# Patient Record
Sex: Female | Born: 1985 | Race: Black or African American | Hispanic: No | Marital: Married | Smoking: Former smoker
Health system: Southern US, Community
[De-identification: ages and names within clinical notes are randomized; demographics above are authoritative.]

## PROBLEM LIST (undated history)

## (undated) ENCOUNTER — Inpatient Hospital Stay (HOSPITAL_COMMUNITY): Payer: Self-pay

## (undated) DIAGNOSIS — E119 Type 2 diabetes mellitus without complications: Secondary | ICD-10-CM

## (undated) DIAGNOSIS — D649 Anemia, unspecified: Secondary | ICD-10-CM

## (undated) DIAGNOSIS — F329 Major depressive disorder, single episode, unspecified: Secondary | ICD-10-CM

## (undated) DIAGNOSIS — Z98891 History of uterine scar from previous surgery: Secondary | ICD-10-CM

## (undated) DIAGNOSIS — F32A Depression, unspecified: Secondary | ICD-10-CM

## (undated) DIAGNOSIS — H409 Unspecified glaucoma: Secondary | ICD-10-CM

## (undated) DIAGNOSIS — F99 Mental disorder, not otherwise specified: Secondary | ICD-10-CM

## (undated) DIAGNOSIS — Q644 Malformation of urachus: Secondary | ICD-10-CM

## (undated) HISTORY — DX: Anemia, unspecified: D64.9

## (undated) HISTORY — DX: Depression, unspecified: F32.A

## (undated) HISTORY — DX: Major depressive disorder, single episode, unspecified: F32.9

## (undated) HISTORY — PX: CYSTECTOMY: SUR359

## (undated) HISTORY — DX: History of uterine scar from previous surgery: Z98.891

## (undated) HISTORY — DX: Mental disorder, not otherwise specified: F99

## (undated) HISTORY — DX: Type 2 diabetes mellitus without complications: E11.9

## (undated) HISTORY — DX: Unspecified glaucoma: H40.9

---
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
## 2017-12-22 ENCOUNTER — Ambulatory Visit: Payer: Self-pay | Admitting: *Deleted

## 2018-01-07 LAB — CYTOLOGY - PAP
CYSTIC FIBROSIS PROFILE: NEGATIVE
Drug Screen, Urine: NEGATIVE
HEMOGLOBIN A1C: 7
Pap: NEGATIVE
Urine Culture, OB: NEGATIVE

## 2018-01-07 LAB — OB RESULTS CONSOLE ANTIBODY SCREEN: Antibody Screen: NEGATIVE

## 2018-01-07 LAB — OB RESULTS CONSOLE HGB/HCT, BLOOD
HEMATOCRIT: 36
HEMOGLOBIN: 11.2

## 2018-01-07 LAB — HEMOGLOBIN EVAL RFX ELECTROPHORESIS: HEMOGLOBIN EVALUATION: NORMAL

## 2018-01-07 LAB — OB RESULTS CONSOLE GC/CHLAMYDIA
Chlamydia: NEGATIVE
GC PROBE AMP, GENITAL: NEGATIVE

## 2018-01-07 LAB — OB RESULTS CONSOLE RUBELLA ANTIBODY, IGM: Rubella: IMMUNE

## 2018-01-07 LAB — OB RESULTS CONSOLE HIV ANTIBODY (ROUTINE TESTING): HIV: NONREACTIVE

## 2018-01-07 LAB — OB RESULTS CONSOLE ABO/RH: RH Type: POSITIVE

## 2018-01-07 LAB — OB RESULTS CONSOLE RPR: RPR: NONREACTIVE

## 2018-01-07 LAB — OB RESULTS CONSOLE PLATELET COUNT: Platelets: 409

## 2018-01-07 LAB — OB RESULTS CONSOLE VARICELLA ZOSTER ANTIBODY, IGG: VARICELLA IGG: IMMUNE

## 2018-01-08 ENCOUNTER — Other Ambulatory Visit (HOSPITAL_COMMUNITY): Payer: Self-pay | Admitting: Family

## 2018-01-08 DIAGNOSIS — Z369 Encounter for antenatal screening, unspecified: Secondary | ICD-10-CM

## 2018-01-11 ENCOUNTER — Encounter: Payer: Self-pay | Admitting: *Deleted

## 2018-01-12 ENCOUNTER — Encounter: Payer: Medicaid Other | Attending: Obstetrics & Gynecology | Admitting: *Deleted

## 2018-01-12 ENCOUNTER — Ambulatory Visit: Payer: Self-pay | Admitting: *Deleted

## 2018-01-12 DIAGNOSIS — O24111 Pre-existing diabetes mellitus, type 2, in pregnancy, first trimester: Secondary | ICD-10-CM | POA: Diagnosis not present

## 2018-01-12 DIAGNOSIS — E1165 Type 2 diabetes mellitus with hyperglycemia: Secondary | ICD-10-CM | POA: Diagnosis not present

## 2018-01-12 DIAGNOSIS — Z713 Dietary counseling and surveillance: Secondary | ICD-10-CM | POA: Diagnosis not present

## 2018-01-12 NOTE — Progress Notes (Signed)
Patient was seen on 01/12/2018 for type 2 Diabetes and pregnancy self-management. EDD 08/10/2017. Patient here with her mother, Levada Dy, who participated in the visit in a positive way. Patient states history of this diabetes for 3 years.  Diet history obtained. Patient eats good variety of all food groups as well as some high sugar foods and beverages include water, OJ, and occasionally sweet tea.  Her comfort level with carb counting is 0/10. Patient is currently on Metformin @ 1000 mg BID  diabetes medication. Patient moved here from [place], [place] in August this year to be near her mother who moved here in January.  The following learning objectives were met by the patient :   States the definition of type 2 Diabetes and pregnancy   States why dietary management is important in controlling blood glucose  Describes the effects of carbohydrates on blood glucose levels  Demonstrates ability to create a balanced meal plan  Demonstrates carbohydrate counting   States when to check blood glucose levels  Demonstrates proper blood glucose monitoring techniques  States the effect of stress and exercise on blood glucose levels  States the importance of limiting caffeine and abstaining from alcohol and smoking  Plan:   Aim for 3 Carb Choices per meal (45 grams) +/- 1 either way   Aim for 1-2 Carbs per snack  Begin reading food labels for Total Carbohydrate of foods  Consider  increasing your activity level by walking or other activity daily as tolerated  Begin checking BG before breakfast and 2 hours after first bite of breakfast, lunch and dinner as directed by MD   Bring Log Book/Sheet to every medical appointment OR Baby Scripts   Patient was introduced to [employer], she plans to use as record of BG electronically   Take medication as directed by MD  Blood glucose monitor given: [employer] Lot # [id number] Exp: 11/19/19 Blood glucose reading: 246 mg/dl after breakfast which was a  [place] and glass of OJ, which she was told to drink previously to help iron pill get absorbed better. We discussed using alternate foods high in Vitamin C in place of juice.  Patient already has a meter: but she cannot afford strips without insurance coverage. She states she has been testing fasting and the numbers have all been under 120 mg/dl. She has not been testing after meals yet.    Patient instructed to monitor glucose levels: FBS: 60 - 95 mg/dl 2 hour: <120 mg/dl  Patient received the following handouts:  Nutrition Diabetes and Pregnancy  Carbohydrate Counting List  Patient will be seen for follow-up in 1 month and as needed.

## 2018-01-19 ENCOUNTER — Encounter: Payer: Self-pay | Admitting: General Practice

## 2018-01-19 NOTE — Progress Notes (Unsigned)
Patient triggered in BRX for multiple elevated blood sugar readings. Patient has new OB appt in office on Thursday 10/3, will address at that visit.

## 2018-01-21 ENCOUNTER — Telehealth: Payer: Self-pay | Admitting: *Deleted

## 2018-01-21 ENCOUNTER — Ambulatory Visit: Payer: Self-pay | Admitting: *Deleted

## 2018-01-21 ENCOUNTER — Encounter: Payer: Medicaid Other | Attending: Obstetrics & Gynecology | Admitting: *Deleted

## 2018-01-21 ENCOUNTER — Ambulatory Visit (INDEPENDENT_AMBULATORY_CARE_PROVIDER_SITE_OTHER): Payer: Self-pay | Admitting: Obstetrics & Gynecology

## 2018-01-21 ENCOUNTER — Encounter: Payer: Self-pay | Admitting: Obstetrics & Gynecology

## 2018-01-21 DIAGNOSIS — Z713 Dietary counseling and surveillance: Secondary | ICD-10-CM | POA: Diagnosis present

## 2018-01-21 DIAGNOSIS — O24111 Pre-existing diabetes mellitus, type 2, in pregnancy, first trimester: Secondary | ICD-10-CM | POA: Diagnosis not present

## 2018-01-21 DIAGNOSIS — E1165 Type 2 diabetes mellitus with hyperglycemia: Secondary | ICD-10-CM | POA: Insufficient documentation

## 2018-01-21 DIAGNOSIS — O24919 Unspecified diabetes mellitus in pregnancy, unspecified trimester: Secondary | ICD-10-CM

## 2018-01-21 DIAGNOSIS — O0990 Supervision of high risk pregnancy, unspecified, unspecified trimester: Secondary | ICD-10-CM

## 2018-01-21 DIAGNOSIS — Z8249 Family history of ischemic heart disease and other diseases of the circulatory system: Secondary | ICD-10-CM

## 2018-01-21 DIAGNOSIS — Z98891 History of uterine scar from previous surgery: Secondary | ICD-10-CM

## 2018-01-21 DIAGNOSIS — E669 Obesity, unspecified: Secondary | ICD-10-CM | POA: Insufficient documentation

## 2018-01-21 DIAGNOSIS — Q644 Malformation of urachus: Secondary | ICD-10-CM

## 2018-01-21 DIAGNOSIS — E1169 Type 2 diabetes mellitus with other specified complication: Secondary | ICD-10-CM | POA: Insufficient documentation

## 2018-01-21 HISTORY — DX: Malformation of urachus: Q64.4

## 2018-01-21 HISTORY — DX: History of uterine scar from previous surgery: Z98.891

## 2018-01-21 LAB — POCT URINALYSIS DIP (DEVICE)
GLUCOSE, UA: NEGATIVE mg/dL
HGB URINE DIPSTICK: NEGATIVE
Nitrite: NEGATIVE
PROTEIN: NEGATIVE mg/dL
Specific Gravity, Urine: 1.025 (ref 1.005–1.030)
Urobilinogen, UA: 1 mg/dL (ref 0.0–1.0)
pH: 6 (ref 5.0–8.0)

## 2018-01-21 LAB — GLUCOSE, CAPILLARY: GLUCOSE-CAPILLARY: 110 mg/dL — AB (ref 70–99)

## 2018-01-21 MED ORDER — INSULIN NPH (HUMAN) (ISOPHANE) 100 UNIT/ML ~~LOC~~ SUSP: 10.0000 [IU] | Freq: Every day | SUBCUTANEOUS | 3 refills | Status: DC

## 2018-01-21 MED ORDER — "INSULIN SYRINGE 31G X 5/16"" 0.5 ML MISC": 10 refills | Status: DC

## 2018-01-21 NOTE — Telephone Encounter (Signed)
Reports there was a problem with the insulin prescription. Requests a call back.

## 2018-01-21 NOTE — Progress Notes (Signed)
FHR obtained with bedside US per M-mode.    PRENATAL VISIT NOTE  Subjective:  Christina Morton is a 32 y.o. G2P1001 at [redacted]w[redacted]d being seen today for ongoing prenatal care.  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  [facility]    Patient reports no complaints.  Contractions: Not present. Vag. Bleeding: None.   . Denies leaking of fluid.   The following portions of the patient's history were reviewed and updated as appropriate: allergies, current medications, past family history, past medical history, past social history, past surgical history and problem list. Problem list updated.  Objective:   Vitals:   01/21/18 0908 01/21/18 0909  BP: 109/67   Pulse: 86   Weight: 254 lb 8 oz (115.4 kg)   Height:  5\' 6"  (1.676 m)    Fetal Status: Fetal Heart Rate (bpm): 166         General:  Alert, oriented and cooperative. Patient is in no acute distress.  Skin: Skin is warm and dry. No rash noted.   Cardiovascular: Normal heart rate noted  Respiratory: Normal respiratory effort, no problems with respiration noted  Abdomen: Soft, gravid, appropriate for gestational age.  Pain/Pressure: Absent     Pelvic: Cervical exam deferred        Extremities: Normal range of motion.  Edema: None  Mental Status: Normal mood and affect. Normal behavior. Normal judgment and thought content.   Assessment and Plan:  Pregnancy: G2P1001 at [redacted]w[redacted]d  1. Supervision of high risk pregnancy, antepartum - Genetic Screening - US OB Comp Less 14 Wks; Future - Protein / creatinine ratio, urine  2. History of low vertical cesarean section Rpt c/s at 36-37 weeks  3. 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  Type 2 DM, pregnant fastings elevated; some other isolated highs (tea, OJ); Will  start NPH QHS 10 units.  Waiting on Medic[employer] card.  Counseling around diet and insulin best medicine.   -ASA at 13 weeks -THS and Hgb A1c and CMP at next visit [ ]  Fetal ECHO after 20 weeks  [ ]  Eye exam for retina evaluation   5.  Patient's mother has hypertrophic cardiomyopathy.   -Ask if pt's GM has hypertrophic cardiomyopathy.  Can refer to genetics if GM is +for HOCM.   Preterm labor symptoms and general obstetric precautions including but not limited to vaginal bleeding, contractions, leaking of fluid and fetal movement were reviewed in detail with the patient. Please refer to After Visit Summary for other counseling recommendations.  Return in about 2 weeks (around 02/04/2018).  Future Appointments  Date Time Provider Department Center  02/04/2018  9:00 AM WH-MFC [place] 3 WH-MFCUS MFC-US  02/04/2018  9:30 AM WH-MFC LAB WH-MFC MFC-US  02/09/2018  9:00 AM WOC-EDUCATION WOC-WOCA WOC    Elsie Lincoln, MD

## 2018-01-21 NOTE — Telephone Encounter (Signed)
Called pt to inform pt of ultrasound appointment on October 9th at 0800.  Pt verbalized understanding.

## 2018-01-21 NOTE — Progress Notes (Signed)
Insulin Instruction  Patient was seen on 01/21/2018 for insulin instruction.  She is here with her mother who participated in the visit and her sig. other.The following learning objectives were met by the patient during this visit:   Insulin Action of NPH insulins  Reviewed syringe & vial VS pen including # units per syringe,    length of needles  Hygiene and storage  Drawing up single and mixed doses if using vials   Single dose      Rotation of Sites  Hypoglycemia- symptoms, causes , treatment choices  Record keeping and MD follow up -    she is using Baby Scripts to record BG currently.  Patient demonstrated understanding of insulin administration by return demonstration.  I recommend Rel[employer]On from [facility] for her NPH insulin until she gets insurance coverage.  I recommend 50 unit (0.5 cc) syringe at this time. Especially if she may need to mix insulin in the future.  Patient received the following handouts:  Insulin Instruction Handout                                        Patient to start on insulin as Rx'd by MD  Patient will be seen for follow-up as needed.

## 2018-01-22 LAB — PROTEIN / CREATININE RATIO, URINE
Creatinine, Urine: 308.5 mg/dL
Protein, Ur: 22.4 mg/dL
Protein/Creat Ratio: 73 mg/g creat (ref 0–200)

## 2018-01-22 NOTE — Telephone Encounter (Signed)
Per conversation with Bev Paddock yesterday, [facility]On NPH at [facility] has been backordered for several months and is no longer available. A message has already been sent to Dr Penne Lash asking for change in therapy. Called [facility] outpatient pharmacy & they can offer NPH to patient for $50.  Called to discuss with patient & update her in process but no answer- left message stating we are trying to reach you to return your phone call, please check your mychart account for more information. Will send message to patient.

## 2018-01-26 ENCOUNTER — Other Ambulatory Visit: Payer: Self-pay | Admitting: General Practice

## 2018-01-26 ENCOUNTER — Encounter: Payer: Self-pay | Admitting: Obstetrics & Gynecology

## 2018-01-26 DIAGNOSIS — O0990 Supervision of high risk pregnancy, unspecified, unspecified trimester: Secondary | ICD-10-CM

## 2018-01-26 DIAGNOSIS — O24919 Unspecified diabetes mellitus in pregnancy, unspecified trimester: Secondary | ICD-10-CM

## 2018-01-26 MED ORDER — ACCU-CHEK GUIDE W/DEVICE KIT: 1.0000 | PACK | Freq: Once | 0 refills | Status: AC

## 2018-01-26 MED ORDER — "INSULIN SYRINGE 31G X 5/16"" 0.5 ML MISC": 10 refills | Status: DC

## 2018-01-26 MED ORDER — ACCU-CHEK FASTCLIX LANCETS MISC: 1.0000 | Freq: Four times a day (QID) | 12 refills | Status: DC

## 2018-01-26 MED ORDER — GLUCOSE BLOOD VI STRP: ORAL_STRIP | 12 refills | Status: DC

## 2018-01-26 MED ORDER — INSULIN NPH (HUMAN) (ISOPHANE) 100 UNIT/ML ~~LOC~~ SUSP: 10.0000 [IU] | Freq: Every day | SUBCUTANEOUS | 3 refills | Status: DC

## 2018-01-27 ENCOUNTER — Telehealth: Payer: Self-pay | Admitting: *Deleted

## 2018-01-27 ENCOUNTER — Encounter: Payer: Self-pay | Admitting: General Practice

## 2018-01-27 ENCOUNTER — Ambulatory Visit (INDEPENDENT_AMBULATORY_CARE_PROVIDER_SITE_OTHER): Payer: Medicaid Other | Admitting: General Practice

## 2018-01-27 ENCOUNTER — Ambulatory Visit (HOSPITAL_COMMUNITY)
Admission: RE | Admit: 2018-01-27 | Discharge: 2018-01-27 | Disposition: A | Discharge status: Home / Self Care | Payer: Medicaid Other | Source: Ambulatory Visit | Attending: Obstetrics & Gynecology | Admitting: Obstetrics & Gynecology

## 2018-01-27 DIAGNOSIS — O0991 Supervision of high risk pregnancy, unspecified, first trimester: Secondary | ICD-10-CM | POA: Diagnosis present

## 2018-01-27 DIAGNOSIS — Z3A12 12 weeks gestation of pregnancy: Secondary | ICD-10-CM | POA: Insufficient documentation

## 2018-01-27 DIAGNOSIS — Z712 Person consulting for explanation of examination or test findings: Secondary | ICD-10-CM

## 2018-01-27 DIAGNOSIS — O0990 Supervision of high risk pregnancy, unspecified, unspecified trimester: Secondary | ICD-10-CM

## 2018-01-27 NOTE — Telephone Encounter (Signed)
Chris from [facility] called to report some missing information on the pt's form that they needed to clear up.  EDD, maternal weight, and whether or not this is a twin pregnancy was not clear.  Please call back at [phone] to clarify

## 2018-01-27 NOTE — Progress Notes (Signed)
Patient presents to office today for dating ultrasound results. Reviewed dating ultrasound results with patient & discussed LMP is still best dating for her. Also provided pictures.  Patient verbalized understanding & asked about new insulin Rx. Patient asked if she should continue metformin BID AND take the NPH insulin at bedtime or if she should drop evening dose of metformin and take the insulin at bedtime. Discussed with patient I am uncertain based off Dr Bertram Denver note, but I will contact her and send a mychart message later with medication instructions. Patient verbalized understanding & had no other questions at this time.

## 2018-01-28 ENCOUNTER — Encounter: Payer: Self-pay | Admitting: *Deleted

## 2018-01-28 ENCOUNTER — Encounter (HOSPITAL_COMMUNITY): Payer: Self-pay

## 2018-01-28 NOTE — Progress Notes (Signed)
I reviewed the [place] and agree with the nursing assessment and plan. Diabetes POC to be determined by Dr. Penne Lash.   Katrinka Blazing, [place] 07/17/2017 10:32 AM

## 2018-02-02 ENCOUNTER — Encounter: Payer: Self-pay | Admitting: *Deleted

## 2018-02-04 ENCOUNTER — Encounter: Payer: Self-pay | Admitting: Family Medicine

## 2018-02-04 ENCOUNTER — Ambulatory Visit (HOSPITAL_COMMUNITY)
Admission: RE | Admit: 2018-02-04 | Discharge: 2018-02-04 | Disposition: A | Discharge status: Home / Self Care | Payer: Medicaid Other | Source: Ambulatory Visit | Attending: Family | Admitting: Family

## 2018-02-04 ENCOUNTER — Encounter (HOSPITAL_COMMUNITY): Payer: Self-pay

## 2018-02-04 DIAGNOSIS — Z3682 Encounter for antenatal screening for nuchal translucency: Secondary | ICD-10-CM | POA: Diagnosis not present

## 2018-02-04 DIAGNOSIS — Z3A13 13 weeks gestation of pregnancy: Secondary | ICD-10-CM

## 2018-02-04 DIAGNOSIS — Z369 Encounter for antenatal screening, unspecified: Secondary | ICD-10-CM

## 2018-02-04 NOTE — Progress Notes (Signed)
Patient did not keep appointment today. She will be called to reschedule.  

## 2018-02-04 NOTE — BH Specialist Note (Deleted)
Integrated Behavioral Health Initial Visit  MRN: [id number] Name: Christina Morton  Number of Integrated Behavioral Health Clinician visits:: 1/6 Session Start time: ***  Session End time: *** Total time: {IBH Total Time:21014050}  Type of Service: Integrated Behavioral Health- Individual/Family Interpretor:No. Interpretor Name and Language: n/a   Warm Hand Off Completed.       SUBJECTIVE: Christina Morton is a 32 y.o. female accompanied by {CHL AMB ACCOMPANIED WU:9811914782} Patient was referred by Tinnie Gens, MD for Initial OB introduction to integrated behavioral health services . Patient reports the following symptoms/concerns: *** Duration of problem: ***; Severity of problem: {Mild/Moderate/Severe:20260}  OBJECTIVE: Mood: {BHH MOOD:22306} and Affect: {BHH AFFECT:22307} Risk of harm to self or others: {CHL AMB BH Suicide Current Mental Status:21022748}  LIFE CONTEXT: Family and Social: *** School/Work: *** Self-Care: *** Life Changes: Current pregnancy ***  GOALS ADDRESSED: Patient will: 1. Reduce symptoms of: {IBH Symptoms:21014056} 2. Increase knowledge and/or ability of: {IBH Patient Tools:21014057}  3. Demonstrate ability to: {IBH Goals:21014053}  INTERVENTIONS: Interventions utilized: {IBH Interventions:21014054}  Standardized Assessments completed: GAD-7 and PHQ 9  ASSESSMENT: Patient currently experiencing Supervision of *** pregnancy, ***.   Patient may benefit from Initial OB introduction to integrated behavioral health services .  PLAN: 1. Follow up with behavioral health clinician on : *** 2. Behavioral recommendations: *** 3. Referral(s): {IBH Referrals:21014055} 4. "From scale of 1-10, how likely are you to follow plan?": ***  Valetta Close Lataysha Vohra, LCSW  ***

## 2018-02-09 ENCOUNTER — Other Ambulatory Visit: Payer: Self-pay

## 2018-02-11 ENCOUNTER — Telehealth: Payer: Self-pay | Admitting: General Practice

## 2018-02-11 DIAGNOSIS — O24919 Unspecified diabetes mellitus in pregnancy, unspecified trimester: Secondary | ICD-10-CM

## 2018-02-11 MED ORDER — METFORMIN HCL 1000 MG PO TABS: 1000.0000 mg | ORAL_TABLET | Freq: Two times a day (BID) | ORAL | 5 refills | Status: DC

## 2018-02-11 MED ORDER — PRENATAL VITAMINS 0.8 MG PO TABS: 1.0000 | ORAL_TABLET | Freq: Every day | ORAL | 12 refills | Status: DC

## 2018-02-11 NOTE — Telephone Encounter (Signed)
Patient called and left message on nurse voicemail line stating she needs a refill on her metformin 1000mg  that she takes twice a day as well as prenatal vitamins.  Obtained refills via Dr Jolayne Panther.  Called & informed patient. Patient verbalized understanding & had no questions.

## 2018-02-15 ENCOUNTER — Ambulatory Visit (INDEPENDENT_AMBULATORY_CARE_PROVIDER_SITE_OTHER): Payer: Medicaid Other | Admitting: Family Medicine

## 2018-02-15 VITALS — BP 123/71 | HR 75 | Wt 255.7 lb

## 2018-02-15 DIAGNOSIS — O24112 Pre-existing diabetes mellitus, type 2, in pregnancy, second trimester: Secondary | ICD-10-CM

## 2018-02-15 DIAGNOSIS — Z98891 History of uterine scar from previous surgery: Secondary | ICD-10-CM

## 2018-02-15 DIAGNOSIS — O0990 Supervision of high risk pregnancy, unspecified, unspecified trimester: Secondary | ICD-10-CM

## 2018-02-15 LAB — POCT URINALYSIS DIP (DEVICE)
Glucose, UA: NEGATIVE mg/dL
Hgb urine dipstick: NEGATIVE
LEUKOCYTES UA: NEGATIVE
Nitrite: NEGATIVE
PH: 5.5 (ref 5.0–8.0)
Protein, ur: NEGATIVE mg/dL
UROBILINOGEN UA: 0.2 mg/dL (ref 0.0–1.0)

## 2018-02-15 NOTE — Progress Notes (Signed)
   PRENATAL VISIT NOTE  Subjective:  Christina Morton is a 32 y.o. G2P1001 at [redacted]w[redacted]d being seen today for ongoing prenatal care.  She is currently monitored for the following issues for this high-risk pregnancy and has Supervision of high risk pregnancy, antepartum; History of cesarean section, classical; Urachal cyst; Family history of heart disease; and Diabetes mellitus complicating pregnancy, antepartum on their problem list.  Patient reports no complaints.  Contractions: Not present. Vag. Bleeding: None.   . Denies leaking of fluid.   The following portions of the patient's history were reviewed and updated as appropriate: allergies, current medications, past family history, past medical history, past social history, past surgical history and problem list. Problem list updated.  Objective:   Vitals:   02/15/18 0825  BP: 123/71  Pulse: 75  Weight: 255 lb 11.2 oz (116 kg)    Fetal Status: Fetal Heart Rate (bpm): 138         General:  Alert, oriented and cooperative. Patient is in no acute distress.  Skin: Skin is warm and dry. No rash noted.   Cardiovascular: Normal heart rate noted  Respiratory: Normal respiratory effort, no problems with respiration noted  Abdomen: Soft, gravid, appropriate for gestational age.  Pain/Pressure: Present     Pelvic: Cervical exam deferred        Extremities: Normal range of motion.  Edema: None  Mental Status: Normal mood and affect. Normal behavior. Normal judgment and thought content.   Assessment and Plan:  Pregnancy: G2P1001 at [redacted]w[redacted]d  1. Supervision of high risk pregnancy, antepartum FHT and FH normal - [place] MFM OB DETAIL +14 WK; Future  2. Pregnancy with type 2 diabetes mellitus in second trimester On metformin 1000mg  BID, NPH 10units qhs.  CBGs mostly under control this past week.  No changes. Discussed serial growth [place] Antenatal testing  3. History of cesarean section, classical Low vertical with 4cm extension into myometrium.  Treat  as classical with delivery 36-37 weeks  Preterm labor symptoms and general obstetric precautions including but not limited to vaginal bleeding, contractions, leaking of fluid and fetal movement were reviewed in detail with the patient. Please refer to After Visit Summary for other counseling recommendations.  Return in about 4 weeks (around 03/15/2018) for OB f/u.  Future Appointments  Date Time Provider Department Center  03/01/2018  9:45 AM Shirley, [place], DO [facility]  03/01/2018  3:15 PM Levie Heritage, DO WOC-WOCA WOC  03/15/2018  1:15 PM Levie Heritage, DO WOC-WOCA WOC  03/17/2018  9:45 AM WH-MFC [place] 5 WH-MFCUS MFC-US    Levie Heritage, DO

## 2018-02-22 ENCOUNTER — Encounter: Payer: Self-pay | Admitting: General Practice

## 2018-02-22 NOTE — Progress Notes (Signed)
Start NPH 10 units in AM, so she'll be taking NPH 10units twice a day.

## 2018-02-22 NOTE — Progress Notes (Signed)
Patient triggered in BabyScripts for elevated blood sugars. Per chart review, patient saw Dr Adrian Blackwater on 10/28 & changes were made to patient's insulin dose. Will route to Dr Adrian Blackwater.

## 2018-02-24 ENCOUNTER — Encounter (INDEPENDENT_AMBULATORY_CARE_PROVIDER_SITE_OTHER): Payer: Medicaid Other | Admitting: Ophthalmology

## 2018-02-24 DIAGNOSIS — H33303 Unspecified retinal break, bilateral: Secondary | ICD-10-CM

## 2018-02-24 DIAGNOSIS — H5213 Myopia, bilateral: Secondary | ICD-10-CM | POA: Diagnosis not present

## 2018-02-24 DIAGNOSIS — H43813 Vitreous degeneration, bilateral: Secondary | ICD-10-CM | POA: Diagnosis not present

## 2018-02-24 DIAGNOSIS — H35413 Lattice degeneration of retina, bilateral: Secondary | ICD-10-CM

## 2018-02-24 NOTE — Progress Notes (Signed)
Called patient and instructed her to begin taking NPH 10 units right before breakfast per Dr Adrian Blackwater. Patient verbalized understanding & had no questions.

## 2018-03-01 ENCOUNTER — Institutional Professional Consult (permissible substitution): Payer: Medicaid Other

## 2018-03-01 ENCOUNTER — Ambulatory Visit: Payer: Medicaid Other | Admitting: Family Medicine

## 2018-03-01 ENCOUNTER — Encounter: Payer: Medicaid Other | Admitting: Family Medicine

## 2018-03-01 NOTE — Progress Notes (Signed)
Increase bedtime insulin NPH to 15 units.

## 2018-03-01 NOTE — Progress Notes (Signed)
Received Baby Scripts Glucose Trigger today:

## 2018-03-02 ENCOUNTER — Ambulatory Visit (INDEPENDENT_AMBULATORY_CARE_PROVIDER_SITE_OTHER): Payer: Medicaid Other | Admitting: Clinical

## 2018-03-02 DIAGNOSIS — F4321 Adjustment disorder with depressed mood: Secondary | ICD-10-CM | POA: Diagnosis not present

## 2018-03-02 NOTE — BH Specialist Note (Signed)
Integrated Behavioral Health Initial Visit  MRN: [id number] Name: Christina Morton  Number of Integrated Behavioral Health Clinician visits:: 1/6 Session Start time: 1:10  Session End time: 2:25 Total time: 1 hour  Type of Service: Integrated Behavioral Health- Individual/Family Interpretor:No. Interpretor Name and Language: n/a   Warm Hand Off Completed.       SUBJECTIVE: Christina Morton is a 32 y.o. female accompanied by n/a Patient was referred by Candelaria CelesteJacob Stinson, DO for history of depression. Patient reports the following symptoms/concerns: Pt states her primary concern today is feeling overwhelming fatigue daily, with lack of quality sleep, and depression.  Symptoms increased after the loss of her 19yo brother last year (and "1-2 family deaths" every year for the past 5 years"), [place]  Duration of problem: Latest increase in current pregnancy  Severity of problem: moderate  OBJECTIVE: Mood: Anxious and Depressed and Affect: Tearful Risk of harm to self or others: No plan to harm self or others  LIFE CONTEXT: Family and Social: Pt lives with her fiancee and 7yo son School/Work: Industrial/product designeriancee working Self-Care: - Life Changes: Loss of numerous family members in past 5 years, with most recent loss of 19yo brother in 2018 in [place], [place], and current pregnancy  GOALS ADDRESSED: Patient will: 1. Reduce symptoms of: depression and stress 2. Increase knowledge and/or ability of: healthy habits and self-management skills  3. Demonstrate ability to: Increase healthy adjustment to current life circumstances and Increase motivation to adhere to plan of care  INTERVENTIONS: Interventions utilized: Mindfulness or Management consultantelaxation Training, Supportive Counseling, Psychoeducation and/or Health Education and Link to [facility]  Standardized Assessments completed: GAD-7 and PHQ 9  ASSESSMENT: Patient currently experiencing Adjustment disorder with depressed mood.    Patient may benefit from psychoeducation and brief therapeutic interventions regarding coping with symptoms of depression.  PLAN: 1. Follow up with behavioral health clinician on : Two weeks(will discuss barriers to emotional health day at this time) 2. Behavioral recommendations:  -CALM relaxation breathing exercise twice daily (morning; nighttime) for next two weeks -Review family schedule for remainder of 2019; pick one day as "emotional health" day.  -Read educational materials regarding coping with symptoms of depression and anxiety  3. Referral(s): [facility] 4. "From scale of 1-10, how likely are you to follow plan?": -  Rae LipsJamie C McMannes, LCSW  Dep[facility] screen [facility] 02/15/2018  Decreased Interest 0  Down, Depressed, Hopeless 1  PHQ - 2 Score 1  Altered sleeping 1  Tired, decreased energy 1  Change in appetite 0  Feeling bad or failure about yourself  0  Trouble concentrating 0  Moving slowly or fidgety/restless 0  Suicidal thoughts 0  PHQ-9 Score 3   GAD 7 : Generalized Anxiety Score 02/15/2018  Nervous, Anxious, on Edge 0  Control/stop worrying 1  Worry too much - different things 1  Trouble relaxing 0  Restless 0  Easily annoyed or irritable 0  Afraid - awful might happen 1  Total GAD 7 Score 3

## 2018-03-03 ENCOUNTER — Telehealth: Payer: Self-pay | Admitting: General Practice

## 2018-03-03 NOTE — Telephone Encounter (Signed)
Called patient and instructed her to increase bedtime insulin NPH to 15 units per Dr Adrian BlackwaterStinson. Patient verbalized understanding and had no questions.

## 2018-03-09 ENCOUNTER — Other Ambulatory Visit: Payer: Self-pay

## 2018-03-09 ENCOUNTER — Inpatient Hospital Stay (HOSPITAL_COMMUNITY)
Admission: AD | Admit: 2018-03-09 | Discharge: 2018-03-09 | Disposition: A | Discharge status: Home / Self Care | Payer: Medicaid Other | Source: Ambulatory Visit | Attending: Family Medicine | Admitting: Family Medicine

## 2018-03-09 ENCOUNTER — Encounter (HOSPITAL_COMMUNITY): Payer: Self-pay | Admitting: *Deleted

## 2018-03-09 DIAGNOSIS — Z87891 Personal history of nicotine dependence: Secondary | ICD-10-CM | POA: Insufficient documentation

## 2018-03-09 DIAGNOSIS — Z880 Allergy status to penicillin: Secondary | ICD-10-CM | POA: Diagnosis not present

## 2018-03-09 DIAGNOSIS — O2342 Unspecified infection of urinary tract in pregnancy, second trimester: Secondary | ICD-10-CM | POA: Diagnosis not present

## 2018-03-09 DIAGNOSIS — Z3492 Encounter for supervision of normal pregnancy, unspecified, second trimester: Secondary | ICD-10-CM

## 2018-03-09 DIAGNOSIS — Z3A17 17 weeks gestation of pregnancy: Secondary | ICD-10-CM

## 2018-03-09 DIAGNOSIS — R109 Unspecified abdominal pain: Secondary | ICD-10-CM | POA: Diagnosis present

## 2018-03-09 LAB — WET PREP, GENITAL
CLUE CELLS WET PREP: NONE SEEN
SPERM: NONE SEEN
TRICH WET PREP: NONE SEEN
Yeast Wet Prep HPF POC: NONE SEEN

## 2018-03-09 LAB — URINALYSIS, ROUTINE W REFLEX MICROSCOPIC
Bilirubin Urine: NEGATIVE
GLUCOSE, UA: NEGATIVE mg/dL
Hgb urine dipstick: NEGATIVE
Ketones, ur: 5 mg/dL — AB
NITRITE: NEGATIVE
PH: 5 (ref 5.0–8.0)
Protein, ur: 30 mg/dL — AB
Specific Gravity, Urine: 1.038 — ABNORMAL HIGH (ref 1.005–1.030)

## 2018-03-09 MED ORDER — NITROFURANTOIN MONOHYD MACRO 100 MG PO CAPS: 100.0000 mg | ORAL_CAPSULE | Freq: Two times a day (BID) | ORAL | 0 refills | Status: DC

## 2018-03-09 NOTE — Discharge Instructions (Signed)

## 2018-03-09 NOTE — MAU Note (Signed)
PT SAYS SHE HAS  ABD PAIN- STARTED YESTERDAY - NO MEDS.   LAST SEX-  LAST WEEK.     HAS HAD ABD PAIN- BEFORE - DID NOT COME  TO HOSPITAL.    [facility] - WITH CLINIC.

## 2018-03-09 NOTE — MAU Provider Note (Signed)
History     CSN: [id number]  Arrival date and time: 03/09/18 2050   First Provider Initiated Contact with Patient 03/09/18 2132      Chief Complaint  Patient presents with  . Abdominal Pain   HPI Christina Morton is a 32 y.o. G2P1001 at 337w6d who presents to MAU with chief complaint of suprapubic pain. This is a recurring problem, onset within the past couple weeks. Pain is 7/10, does not radiate, no aggravating or alleviating factors. Patient has not taken medication for this pain. "I'm already on medicine for my diabetes and I don't want to take more pills".  Denies pain with urination, flank pain, headache, vaginal bleeding, leaking of fluid, decreased fetal movement, fever, falls, or recent illness.    OB History    Gravida  2   Para  1   Term  1   Preterm  0   AB  0   Living  1     SAB  0   TAB  0   Ectopic  0   Multiple  0   Live Births  1           Past Medical History:  Diagnosis Date  . Anemia   . Depression   . Diabetes mellitus without complication (HCC)   . Mental disorder   . Type 2 diabetes mellitus without complications [facility](HCC)     Past Surgical History:  Procedure Laterality Date  . CESAREAN SECTION    . CYSTECTOMY      History reviewed. No pertinent family history.  Social History   Tobacco Use  . Smoking status: Former Smoker    Packs/day: 1.00    Years: 1.00    Pack years: 1.00    Types: Cigarettes    Last attempt to quit: 11/22/2017    Years since quitting: 0.2  . Smokeless tobacco: Never Used  Substance Use Topics  . Alcohol use: Never    Frequency: Never  . Drug use: Never    Allergies:  Allergies  Allergen Reactions  . Amoxicillin Anaphylaxis  . Penicillins Anaphylaxis    Medications Prior to Admission  Medication Sig Dispense Refill Last Dose  . ACCU-CHEK FASTCLIX LANCETS MISC 1 Device by Percutaneous route 4 (four) times daily. 100 each 12 Taking  . ferrous sulfate 325 (65 FE) MG EC tablet Take 325 mg by  mouth every other day.   Not Taking  . glucose blood (ACCU-CHEK GUIDE) test strip Use as instructed QID 100 each 12 Taking  . insulin NPH Human (HUMULIN N,NOVOLIN N) 100 UNIT/ML injection Inject 0.1 mLs (10 Units total) into the skin at bedtime. (Patient taking differently: Inject 10-15 Units into the skin 2 (two) times daily. 10 units Before breakfast and 15 units at bedtime) 10 mL 3 Taking  . Insulin Syringe-Needle U-100 (INSULIN SYRINGE .5CC/31GX5/16") 31G X 5/16" 0.5 ML MISC Use as directed 30 each 10 Taking  . metFORMIN (GLUCOPHAGE) 1000 MG tablet Take 1 tablet (1,000 mg total) by mouth 2 (two) times daily with a meal. 60 tablet 5 Taking  . Prenatal Multivit-Min-Fe-FA (PRENATAL VITAMINS) 0.8 MG tablet Take 1 tablet by mouth daily. 30 tablet 12 Taking    Review of Systems  Constitutional: Negative for chills, diaphoresis and fever.  Respiratory: Negative for shortness of breath.   Gastrointestinal: Positive for abdominal pain.  Genitourinary: Negative for difficulty urinating, flank pain, vaginal bleeding, vaginal discharge and vaginal pain.  Musculoskeletal: Negative for back pain.  Neurological: Negative for headaches.  All other systems reviewed and are negative.  Physical Exam   Blood pressure 101/65, pulse 86, temperature 98.2 F (36.8 C), temperature source Oral, resp. rate 20, height 5\' 7"  (1.702 m), weight 113.3 kg, last menstrual period 11/04/2017.  Physical Exam  Nursing note and vitals reviewed. Constitutional: She is oriented to person, place, and time. She appears well-developed and well-nourished.  Cardiovascular: Normal rate and normal heart sounds.  Respiratory: Effort normal and breath sounds normal.  GI: Soft. She exhibits no distension. There is no tenderness. There is no rebound, no guarding and no CVA tenderness.  Genitourinary: Vagina normal and uterus normal. No vaginal discharge found.  Neurological: She is alert and oriented to person, place, and time.   Skin: Skin is warm and dry.  Psychiatric: She has a normal mood and affect. Her behavior is normal. Judgment and thought content normal.    MAU Course  Procedures  MDM  Patient Vitals for the past 24 hrs:  BP Temp Temp src Pulse Resp SpO2 Height Weight  03/09/18 2234 102/73 98 F (36.7 C) - 91 17 100 % - -  03/09/18 2102 101/65 98.2 F (36.8 C) Oral 86 20 - 5\' 7"  (1.702 m) 113.3 kg    Results for orders placed or performed during the hospital encounter of 03/09/18 (from the past 24 hour(s))  Wet prep, genital     Status: Abnormal   Collection Time: 03/09/18  9:10 PM  Result Value Ref Range   Yeast Wet Prep HPF POC NONE SEEN NONE SEEN   Trich, Wet Prep NONE SEEN NONE SEEN   Clue Cells Wet Prep HPF POC NONE SEEN NONE SEEN   WBC, Wet Prep HPF POC FEW (A) NONE SEEN   Sperm NONE SEEN   Urinalysis, Routine w reflex microscopic     Status: Abnormal   Collection Time: 03/09/18  9:11 PM  Result Value Ref Range   Color, Urine YELLOW YELLOW   APPearance CLOUDY (A) CLEAR   Specific Gravity, Urine 1.038 (H) 1.005 - 1.030   pH 5.0 5.0 - 8.0   Glucose, UA NEGATIVE NEGATIVE mg/dL   Hgb urine dipstick NEGATIVE NEGATIVE   Bilirubin Urine NEGATIVE NEGATIVE   Ketones, ur 5 (A) NEGATIVE mg/dL   Protein, ur 30 (A) NEGATIVE mg/dL   Nitrite NEGATIVE NEGATIVE   Leukocytes, UA TRACE (A) NEGATIVE   RBC / HPF 6-10 0 - 5 RBC/hpf   WBC, UA 6-10 0 - 5 WBC/hpf   Bacteria, UA RARE (A) NONE SEEN   Squamous Epithelial / LPF 6-10 0 - 5   Mucus PRESENT    Ca Oxalate Crys, UA PRESENT     Meds ordered this encounter  Medications  . nitrofurantoin, macrocrystal-monohydrate, (MACROBID) 100 MG capsule    Sig: Take 1 capsule (100 mg total) by mouth 2 (two) times daily.    Dispense:  10 capsule    Refill:  0    Order Specific Question:   Supervising Provider    Answer:   Reva Bores [[id number]]    Assessment and Plan  --32 y.o. G2P1001 at [redacted]w[redacted]d  --FHT 154 by doppler --UTI, PCN allergy, rx to patient  pharmacy. Urine culture pending --Discharge home in stable condition  F/U: [facility] 03/15/18  Calvert Cantor, CNM 03/09/2018, 10:42 PM

## 2018-03-11 LAB — CULTURE, OB URINE: Culture: <10000 — AB

## 2018-03-15 ENCOUNTER — Encounter (HOSPITAL_COMMUNITY): Payer: Self-pay

## 2018-03-15 ENCOUNTER — Ambulatory Visit (INDEPENDENT_AMBULATORY_CARE_PROVIDER_SITE_OTHER): Payer: Medicaid Other | Admitting: Family Medicine

## 2018-03-15 ENCOUNTER — Ambulatory Visit (HOSPITAL_COMMUNITY)
Admission: RE | Admit: 2018-03-15 | Discharge: 2018-03-15 | Disposition: A | Discharge status: Home / Self Care | Payer: Medicaid Other | Source: Ambulatory Visit | Attending: Family Medicine | Admitting: Family Medicine

## 2018-03-15 VITALS — BP 120/73 | HR 93 | Wt 250.1 lb

## 2018-03-15 DIAGNOSIS — O99212 Obesity complicating pregnancy, second trimester: Secondary | ICD-10-CM

## 2018-03-15 DIAGNOSIS — O0992 Supervision of high risk pregnancy, unspecified, second trimester: Secondary | ICD-10-CM | POA: Diagnosis not present

## 2018-03-15 DIAGNOSIS — O24919 Unspecified diabetes mellitus in pregnancy, unspecified trimester: Secondary | ICD-10-CM

## 2018-03-15 DIAGNOSIS — O24112 Pre-existing diabetes mellitus, type 2, in pregnancy, second trimester: Secondary | ICD-10-CM

## 2018-03-15 DIAGNOSIS — Z98891 History of uterine scar from previous surgery: Secondary | ICD-10-CM

## 2018-03-15 DIAGNOSIS — O34219 Maternal care for unspecified type scar from previous cesarean delivery: Secondary | ICD-10-CM

## 2018-03-15 DIAGNOSIS — Z3A18 18 weeks gestation of pregnancy: Secondary | ICD-10-CM

## 2018-03-15 DIAGNOSIS — Z3682 Encounter for antenatal screening for nuchal translucency: Secondary | ICD-10-CM

## 2018-03-15 DIAGNOSIS — O0990 Supervision of high risk pregnancy, unspecified, unspecified trimester: Secondary | ICD-10-CM

## 2018-03-15 DIAGNOSIS — O24912 Unspecified diabetes mellitus in pregnancy, second trimester: Secondary | ICD-10-CM

## 2018-03-15 LAB — POCT URINALYSIS DIP (DEVICE)
Bilirubin Urine: NEGATIVE
Glucose, UA: NEGATIVE mg/dL
Hgb urine dipstick: NEGATIVE
Leukocytes, UA: NEGATIVE
Nitrite: NEGATIVE
PROTEIN: 30 mg/dL — AB
UROBILINOGEN UA: 1 mg/dL (ref 0.0–1.0)
pH: 5.5 (ref 5.0–8.0)

## 2018-03-15 MED ORDER — ASPIRIN EC 81 MG PO TBEC: 81.0000 mg | DELAYED_RELEASE_TABLET | Freq: Every day | ORAL | 2 refills | Status: DC

## 2018-03-15 NOTE — Progress Notes (Signed)
Subjective:  Christina Morton is a 32 y.o. G2P1001 at 7439w5d being seen today for ongoing prenatal care.  She is currently monitored for the following issues for this high-risk pregnancy and has Supervision of high risk pregnancy, antepartum; History of cesarean section, classical; Urachal cyst; Family history of heart disease; and Diabetes mellitus complicating pregnancy, antepartum on their problem list.  GDM: Patient taking metformin and insulin NPH 10/15.  Reports no hypoglycemic episodes.  Tolerating medication well Fasting: controlled 2hr PP: 3 elevated in 130-140.  Patient reports no complaints.  Contractions: Not present. Vag. Bleeding: None.   . Denies leaking of fluid.   The following portions of the patient's history were reviewed and updated as appropriate: allergies, current medications, past family history, past medical history, past social history, past surgical history and problem list. Problem list updated.  Objective:   Vitals:   03/15/18 1326  BP: 120/73  Pulse: 93  Weight: 250 lb 1.6 oz (113.4 kg)    Fetal Status: Fetal Heart Rate (bpm): 148         General:  Alert, oriented and cooperative. Patient is in no acute distress.  Skin: Skin is warm and dry. No rash noted.   Cardiovascular: Normal heart rate noted  Respiratory: Normal respiratory effort, no problems with respiration noted  Abdomen: Soft, gravid, appropriate for gestational age. Pain/Pressure: Present     Pelvic: Vag. Bleeding: None Vag D/C Character: White   Cervical exam deferred        Extremities: Normal range of motion.  Edema: None  Mental Status: Normal mood and affect. Normal behavior. Normal judgment and thought content.   Urinalysis:      Assessment and Plan:  Pregnancy: G2P1001 at 7739w5d  1. Supervision of high risk pregnancy, antepartum FHT and FH normal - AFP, Serum, Open Spina Bifida  2. Diabetes mellitus complicating pregnancy, antepartum Continue with insulin.  3. History of  cesarean section, classical Rpt c/s at 36-37  Preterm labor symptoms and general obstetric precautions including but not limited to vaginal bleeding, contractions, leaking of fluid and fetal movement were reviewed in detail with the patient. Please refer to After Visit Summary for other counseling recommendations.  No follow-ups on file.   Levie HeritageStinson, Zeinab Rodwell J, DO

## 2018-03-17 ENCOUNTER — Other Ambulatory Visit (HOSPITAL_COMMUNITY): Payer: Medicaid Other

## 2018-03-17 LAB — AFP, SERUM, OPEN SPINA BIFIDA
AFP MOM: 1.55
AFP VALUE AFPOSL: 47.1 ng/mL
Gest. Age on Collection Date: 18.6 weeks
MATERNAL AGE AT EDD: 32.8 a
OSBR Risk 1 IN: 1428
Test Results:: NEGATIVE
Weight: 250 [lb_av]

## 2018-03-22 ENCOUNTER — Ambulatory Visit (INDEPENDENT_AMBULATORY_CARE_PROVIDER_SITE_OTHER): Payer: Medicaid Other | Admitting: Family Medicine

## 2018-03-22 ENCOUNTER — Other Ambulatory Visit: Payer: Self-pay

## 2018-03-22 ENCOUNTER — Encounter: Payer: Self-pay | Admitting: Family Medicine

## 2018-03-22 VITALS — BP 122/64 | HR 89 | Temp 98.5°F | Ht 66.0 in | Wt 248.0 lb

## 2018-03-22 DIAGNOSIS — F3289 Other specified depressive episodes: Secondary | ICD-10-CM

## 2018-03-22 DIAGNOSIS — Z7689 Persons encountering health services in other specified circumstances: Secondary | ICD-10-CM

## 2018-03-22 DIAGNOSIS — Z1389 Encounter for screening for other disorder: Secondary | ICD-10-CM | POA: Diagnosis not present

## 2018-03-22 NOTE — Patient Instructions (Signed)
Thank you for coming to see me today. It was a pleasure!   Please follow up after you have delivered for your diabetes management. Please continue checking your sugars and eating a god diabetic diet, I have attached some suggestions. I have attached a document for "10 little things to do" if you are feeling down or stressed.   If you have any questions or concerns, please do not hesitate to call the office at [phone].  Take Care,   [place] Janique Hoefer, DO   Diet Recommendations for Diabetes  Carbohydrate includes starch, sugar, and fiber.  Of these, only sugar and starch raise blood glucose.  (Fiber is found in fruits, vegetables [especially skin, seeds, and stalks] and whole grains.)   Starchy (carb) foods: Bread, rice, pasta, potatoes, corn, cereal, grits, crackers, bagels, muffins, all baked goods.  (Fruit, milk, and yogurt also have carbohydrate, but most of these foods will not spike your blood sugar as most starchy foods will.)  A few fruits do cause high blood sugars; use small portions of bananas (limit to 1/2 at a time), grapes, watermelon, oranges, and most tropical fruits.   Protein foods: Meat, fish, poultry, eggs, dairy foods, and beans such as pinto and kidney beans (beans also provide carbohydrate).   1. Eat at least REAL 3 meals and 1-2 snacks per day. Never go more than 4-5 hours while awake without eating. Eat breakfast within the first hour of getting up.   2. Limit starchy foods to TWO per meal and ONE per snack. ONE portion of a starchy  food is equal to the following:   - ONE slice of bread (or its equivalent, such as half of a hamburger bun).   - 1/2 cup of a "scoopable" starchy food such as potatoes or rice.   - 15 grams of Total Carbohydrate as shown on food label.  3. Include at every meal: a protein food, a carb food, and vegetables and/or fruit.   - Obtain twice the volume of veg's as protein or carbohydrate foods for both lunch and dinner.   - Fresh or frozen  veg's are best.   - Keep frozen veg's on hand for a quick vegetable serving.      10 LITTLE Things To Do When You're Feeling Too Down To Do Anything  Take a shower. Even if you plan to stay in all day long and not see a soul, take a shower. It takes the most effort to hop in to the shower but once you do, you'll feel immediate results. It will wake you up and you'll be feeling much fresher (and cleaner too).  Brush and floss your teeth. Give your teeth a good brushing with a floss finish. It's a small task but it feels so good and you can check 'taking care of your health' off the list of things to do.  Do something small on your list. Most of us have some small thing we would like to get done (load of laundry, sew a button, email a friend). Doing one of these things will make you feel like you've accomplished something.  Drink water. Drinking water is easy right? It's also really beneficial for your health so keep a glass beside you all day and take sips often. It gives you energy and prevents you from boredom eating.  Do some floor exercises. The last thing you want to do is exercise but it might be just the thing you need the most. Keep it simple and  do exercises that involve sitting or laying on the floor. Even the smallest of exercises release chemicals in the brain that make you feel good. Yoga stretches or core exercises are going to make you feel good with minimal effort.  Make your bed. Making your bed takes a few minutes but it's productive and you'll feel relieved when it's done. An unmade bed is a huge visual reminder that you're having an unproductive day. Do it and consider it your housework for the day.  Put on some nice clothes. Take the sweatpants off even if you don't plan to go anywhere. Put on clothes that make you feel good. Take a look in the mirror so your brain recognizes the sweatpants have been replaced with clothes that make you look great. It's an instant  confidence booster.  Wash the dishes. A pile of dirty dishes in the sink is a reflection of your mood. It's possible that if you wash up the dishes, your mood will follow suit. It's worth a try.  Cook a real meal. If you have the luxury to have a "do nothing" day, you have time to make a real meal for yourself. Make a meal that you love to eat. The process is good to get you out of the funk and the food will ensure you have more energy for tomorrow.  Write out your thoughts by hand. When you hand write, you stimulate your brain to focus on the moment that you're in so make yourself comfortable and write whatever comes into your mind. Put those thoughts out on paper so they stop spinning around in your head. Those thoughts might be the very thing holding you down.

## 2018-03-22 NOTE — Progress Notes (Signed)
Subjective:    Patient ID: Christina Morton, female    DOB: 06/21/1985, 32 y.o.   MRN: [id number]   CC: Establish care  HPI: Christina Morton is a 32 year old female who is currently [redacted]w[redacted]d followed by Dr. Gust Rung for pregnancy who is here to establish care with general provider. Moved into the area from [place] to be closer to family after her sibling passed away a few months ago.  Has been here since August.   Diabetes: Patient reports that she has history of diabetes with her A1c 2 months ago being 7.0.  Currently being managed by OB/GYN and is well controlled.  Would not like to get A1c today.   Patient reports that she has a history of glaucoma and is currently followed by Dr. Alan Mulder here in town.  Depression: Patient reports that she has been having trouble with depression and anxiety since her sibling passed a few months ago.  She has a large support system here including her mom and fianc.  She is currently pregnant and would not like to start any medications at this time.  Reports that she has not been on medications in the past.  Denies any known history of bipolar, and reports no psychiatric hospitalizations.  Patient denies any suicidal or homicidal ideation at this time.  Review of Systems  Constitutional: Negative.   HENT: Negative.   Eyes: Negative.   Respiratory: Negative.   Cardiovascular: Negative.   Gastrointestinal: Negative.   Genitourinary: Negative.   Musculoskeletal: Negative.   Skin: Negative.   Neurological: Positive for dizziness and headaches.  Endo/Heme/Allergies: Negative.   Psychiatric/Behavioral: Negative.      Patient Active Problem List   Diagnosis Date Noted  . Depression 03/23/2018  . Supervision of high risk pregnancy, antepartum 01/21/2018  . History of cesarean section, classical 01/21/2018  . 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 Family history of heart disease 01/21/2018  . Diabetes mellitus complicating pregnancy, antepartum  01/21/2018     Family History  Problem Relation Age of Onset  . Hypertrophic cardiomyopathy Mother   . Bradycardia Mother     Past Medical History:  Diagnosis Date  . Anemia   . Depression   . Glaucoma   . Mental disorder   . Type 2 diabetes mellitus without complications (HCC)     Social Hx: Denies tobacco use (stopped on 11/22/17), alcohol use, or illicit drug use.  Objective:  BP 122/64   Pulse 89   Temp 98.5 F (36.9 C) (Oral)   Ht 5\' 6"  (1.676 m)   Wt 248 lb (112.5 kg)   LMP 11/04/2017 (Exact Date)   SpO2 94%   BMI 40.03 kg/m  Vitals and nursing note reviewed  General: NAD, pleasant Head: Atraumatic Neck: Supple Cardiac: RRR, normal heart sounds, no murmurs Respiratory: CTAB, normal effort Abdomen: soft, nontender, nondistended Extremities: no edema or cyanosis. WWP. MSK: normal gait Skin: warm and dry, no rashes noted Neuro: alert and oriented, no focal deficits Psych: Neatly groomed and appropriately dressed. Maintains good eye contact and is cooperative and attentive. Speech is normal volume and rate. Denies SI/ HI. Normal affect.  Depression screen [facility] 2/9 03/22/2018 03/15/2018 03/02/2018  Decreased Interest 1 1 1   Down, Depressed, Hopeless 2 2 1   PHQ - 2 Score 3 3 2   Altered sleeping 1 2 1   Tired, decreased energy 2 2 1   Change in appetite 0 1 0  Feeling bad or failure about yourself  1 2 1   Trouble concentrating 0  0 0  Moving slowly or fidgety/restless 0 0 0  Suicidal thoughts 0 0 0  PHQ-9 Score 7 10 5   Difficult doing work/chores Very difficult - -    Assessment & Plan:   Pregnancy is high risk and being followed by Dr. Adrian BlackwaterStinson.  Depression PHQ 9 score of 7 today.  Patient denies any SI/HI.  Given handouts for "10 little things".  Patient does not want to start medication given that she is pregnant at this time.  However she would like to revisit this subject after she is done with her pregnancy.  Believes that she will be able to cope given  that she has a large support system here.  Patient to call if she has any trouble.   [place] Neri Vieyra, DO Family Medicine Resident, PGY-2

## 2018-03-23 ENCOUNTER — Other Ambulatory Visit (HOSPITAL_COMMUNITY): Payer: Self-pay | Admitting: *Deleted

## 2018-03-23 ENCOUNTER — Telehealth: Payer: Self-pay

## 2018-03-23 DIAGNOSIS — O24119 Pre-existing diabetes mellitus, type 2, in pregnancy, unspecified trimester: Secondary | ICD-10-CM

## 2018-03-23 DIAGNOSIS — F329 Major depressive disorder, single episode, unspecified: Secondary | ICD-10-CM | POA: Insufficient documentation

## 2018-03-23 DIAGNOSIS — H409 Unspecified glaucoma: Secondary | ICD-10-CM | POA: Insufficient documentation

## 2018-03-23 DIAGNOSIS — F32A Depression, unspecified: Secondary | ICD-10-CM | POA: Insufficient documentation

## 2018-03-23 NOTE — Telephone Encounter (Signed)
Pt called stating she had a question could someone call her back.

## 2018-03-23 NOTE — Assessment & Plan Note (Signed)
PHQ 9 score of 7 today.  Patient denies any SI/HI.  Given handouts for "10 little things".  Patient does not want to start medication given that she is pregnant at this time.  However she would like to revisit this subject after she is done with her pregnancy.  Believes that she will be able to cope given that she has a large support system here.  Patient to call if she has any trouble.

## 2018-03-25 ENCOUNTER — Encounter: Payer: Self-pay | Admitting: *Deleted

## 2018-03-25 NOTE — Telephone Encounter (Signed)
Discussed with Bev our Diabetic educator and she is contacting dexcom rep and will let us know if patient is eligible.

## 2018-03-25 NOTE — Telephone Encounter (Signed)
I called Christina Morton and she wants to see if we can send in a rx for a machine called dexcom to a company called [employer] .She also states once you attach it - it checks your blood sugar and you don't have to prick yourself all day.  She states phone number is [phone] and their fax is [phone]. I explained I would need to check with provider first and may be issue with insurance- she states is covered by [employer].  She also wants to make appointment to see [facility] PavilionJamie. I explained I could not make that appointment but I would send a message to registrars and they will call her with appt.

## 2018-03-25 NOTE — Telephone Encounter (Signed)
Per Bev [employer] states with medicaid if doctor will complete a letter of medical necessity it would be covered.  I discussed with Dr. Vergie LivingPickens and he states we can not justify medical necessity.   I called Keena and notified her. I informed her she can ask her doctor who manages her Diabetes type 2. She states she will discuss with Dr.Stinson at her next visit.

## 2018-03-29 ENCOUNTER — Ambulatory Visit (INDEPENDENT_AMBULATORY_CARE_PROVIDER_SITE_OTHER): Payer: Medicaid Other | Admitting: Clinical

## 2018-03-29 DIAGNOSIS — F4323 Adjustment disorder with mixed anxiety and depressed mood: Secondary | ICD-10-CM

## 2018-03-29 NOTE — BH Specialist Note (Signed)
Integrated Behavioral Health Initial Visit  MRN: [id number] Name: Christina Morton  Number of Integrated Behavioral Health Clinician visits:: 2/6 Session Start time: 9:42  Session End time: 10:45 Total time: 1 hour  Type of Service: Integrated Behavioral Health- Individual/Family Interpretor:No. Interpretor Name and Language: n/a   Warm Hand Off Completed.       SUBJECTIVE: Christina Morton is a 32 y.o. female accompanied by n/a Patient was referred by Candelaria CelesteJacob Stinson, DO for history of depression, at last visit. Patient reports the following symptoms/concerns: Pt states her primary concern today is an increase in anxiety, attributed to interpersonal conflict between her sister (untreated for schizophrenia/bipolar disorder, with no prenatal care), and the rest of the family. Pt says the family is overwhelmed, in their attempts to help her sister, and pt needs to talk through her feelings today.  Duration of problem: Increase in past month; Severity of problem: moderate  OBJECTIVE: Mood: Anxious and Affect: Appropriate Risk of harm to self or others: No plan to harm self or others  LIFE CONTEXT: Family and Social: Pt lives with her fiancee and 7yo son School/Work: Industrial/product designeriancee working Self-Care: Recognizing a greater need for self care and personal boundaries  Life Changes: Current pregnancy; loss of numerous family members in past 5 years, with most recent loss of 19yo brother [place], [place]. Family stress coping with pt's sister.   GOALS ADDRESSED: Patient will: 1. Reduce symptoms of: anxiety, depression and stress 2. Increase knowledge and/or ability of: healthy habits and stress reduction  3. Demonstrate ability to: Increase healthy adjustment to current life circumstances and Increase adequate support systems for patient/family  INTERVENTIONS: Interventions utilized: Brief CBT, Psychoeducation and/or Health Education and Link to [facility]  Standardized  Assessments completed: GAD-7 and PHQ 9  ASSESSMENT: Patient currently experiencing Adjustment disorder with anxious and depressed mood   Patient may benefit from continued brief therapeutic interventions regarding coping with symptoms of anxiety and depression .  PLAN: 1. Follow up with behavioral health clinician on : one month, or earlier,as needed 2. Behavioral recommendations:  -Contact local [facility] support group; plan to attend at least one meeting, with mother, before the end of the month -Continue using relaxation breathing, at least once/day; continue for as long as remains helpful -Continue to set healthy boundaries with sister, as discussed in office visit 3. Referral(s): Integrated Art gallery managerBehavioral Health Services (In Clinic) and [employer] [facility]:  [facility] support group 4. "From scale of 1-10, how likely are you to follow plan?": 10  Rae LipsJamie C , LCSW  Dep[facility] 2/9 03/29/2018 03/22/2018 03/15/2018 03/02/2018 02/15/2018  Decreased Interest 2 1 1 1  0  Down, Depressed, Hopeless 2 2 2 1 1   PHQ - 2 Score 4 3 3 2 1   Altered sleeping 1 1 2 1 1   Tired, decreased energy 2 2 2 1 1   Change in appetite 0 0 1 0 0  Feeling bad or failure about yourself  2 1 2 1  0  Trouble concentrating 0 0 0 0 0  Moving slowly or fidgety/restless 0 0 0 0 0  Suicidal thoughts 0 0 0 0 0  PHQ-9 Score 9 7 10 5 3   Difficult doing work/chores - Very difficult - - -   GAD 7 : Generalized Anxiety Score 03/29/2018 03/15/2018 03/02/2018 02/15/2018  Nervous, Anxious, on Edge 3 2 0 0  Control/stop worrying 3 2 1 1   Worry too much - different things 3 2 1 1   Trouble relaxing 2 1  0 0  Restless 0 0 0 0  Easily annoyed or irritable 1 1 0 0  Afraid - awful might happen 2 2 1 1   Total GAD 7 Score 14 10 3  3

## 2018-04-02 ENCOUNTER — Ambulatory Visit (INDEPENDENT_AMBULATORY_CARE_PROVIDER_SITE_OTHER): Payer: Medicaid Other | Admitting: Family Medicine

## 2018-04-02 VITALS — BP 113/75 | HR 98 | Wt 251.4 lb

## 2018-04-02 DIAGNOSIS — O0990 Supervision of high risk pregnancy, unspecified, unspecified trimester: Secondary | ICD-10-CM

## 2018-04-02 DIAGNOSIS — O0992 Supervision of high risk pregnancy, unspecified, second trimester: Secondary | ICD-10-CM

## 2018-04-02 DIAGNOSIS — Z98891 History of uterine scar from previous surgery: Secondary | ICD-10-CM

## 2018-04-02 DIAGNOSIS — O24919 Unspecified diabetes mellitus in pregnancy, unspecified trimester: Secondary | ICD-10-CM

## 2018-04-02 DIAGNOSIS — O24912 Unspecified diabetes mellitus in pregnancy, second trimester: Secondary | ICD-10-CM

## 2018-04-02 LAB — POCT URINALYSIS DIP (DEVICE)
Glucose, UA: NEGATIVE mg/dL
Hgb urine dipstick: NEGATIVE
KETONES UR: NEGATIVE mg/dL
NITRITE: NEGATIVE
Protein, ur: NEGATIVE mg/dL
Specific Gravity, Urine: >=1.03 (ref 1.005–1.030)
Urobilinogen, UA: 1 mg/dL (ref 0.0–1.0)
pH: 6 (ref 5.0–8.0)

## 2018-04-02 NOTE — Progress Notes (Signed)
Subjective:  Christina MustacheJasmine Morton is a 32 y.o. G2P1001 at 4728w2d being seen today for ongoing prenatal care.  She is currently monitored for the following issues for this high-risk pregnancy and has Supervision of high risk pregnancy, antepartum; History of cesarean section, classical; Urachal cyst; Family history of heart disease; Diabetes mellitus complicating pregnancy, antepartum; Depression; and Glaucoma on their problem list.  GDM: Patient taking Metformin and NPH.  Reports no hypoglycemic episodes.  Tolerating medication well Fasting: mostly (all but one) 2hr PP: mostly controlled (4-5 elevated)  Patient reports no complaints.  Contractions: Not present. Vag. Bleeding: None.  Movement: Present. Denies leaking of fluid.   The following portions of the patient's history were reviewed and updated as appropriate: allergies, current medications, past family history, past medical history, past social history, past surgical history and problem list. Problem list updated.  Objective:   Vitals:   04/02/18 1009  BP: 113/75  Pulse: 98  Weight: 251 lb 6.4 oz (114 kg)    Fetal Status: Fetal Heart Rate (bpm): 154   Movement: Present     General:  Alert, oriented and cooperative. Patient is in no acute distress.  Skin: Skin is warm and dry. No rash noted.   Cardiovascular: Normal heart rate noted  Respiratory: Normal respiratory effort, no problems with respiration noted  Abdomen: Soft, gravid, appropriate for gestational age. Pain/Pressure: Present     Pelvic: Vag. Bleeding: None     Cervical exam deferred        Extremities: Normal range of motion.  Edema: None  Mental Status: Normal mood and affect. Normal behavior. Normal judgment and thought content.   Urinalysis:      Assessment and Plan:  Pregnancy: G2P1001 at 3528w2d  1. Supervision of high risk pregnancy, antepartum FHT and FH normal  2. Diabetes mellitus complicating pregnancy, antepartum CBGs controlled ASA 81mg  Echo  normal Growth US scheduled for 12/23 Continue Metformin and NPH  3. History of cesarean section, classical Rpt c/s at 36-37 weeks  Preterm labor symptoms and general obstetric precautions including but not limited to vaginal bleeding, contractions, leaking of fluid and fetal movement were reviewed in detail with the patient. Please refer to After Visit Summary for other counseling recommendations.  No follow-ups on file.   Christina HeritageStinson, Christina Krolak J, DO

## 2018-04-08 ENCOUNTER — Telehealth: Payer: Self-pay | Admitting: *Deleted

## 2018-04-08 DIAGNOSIS — O24919 Unspecified diabetes mellitus in pregnancy, unspecified trimester: Secondary | ICD-10-CM

## 2018-04-08 NOTE — Telephone Encounter (Signed)
Christina Morton called and left a message this am she is having an issue with her RX.

## 2018-04-08 NOTE — BH Specialist Note (Signed)
Integrated Behavioral Health Follow Up Visit  MRN: [id number] Name: Christina Morton  Number of Integrated Behavioral Health Clinician visits: 3/6 Session Start time: 11:38  Session End time: 12:39 Total time: 1 hour  Type of Service: Integrated Behavioral Health- Individual/Family Interpretor:No. Interpretor Name and Language: n/a  SUBJECTIVE: Christina Morton is a 32 y.o. female accompanied by 32yo son Patient was referred by Candelaria CelesteJacob Stinson, DO, at a previous visit, for history of depression. Patient reports the following symptoms/concerns: Pt states her primary concern today is anxiety, worrying about "all the things that could go wrong" in pregnancy, with a high-risk pregnancy. Pt admits she is picking her skin "until there are holes", in healthy skin, scabs and pimples, on her face, back of the neck, arms, legs, has continued this behavior for many years, and is "unable to stop". Pt says this helps her to relieve anxious feeling; she tries to hide it from her boyfriend.   Pt worries she may start picking areas on her finger where she sticks herself for diabetes management.   Duration of problem: Ongoing for years; Severity of problem: moderate  OBJECTIVE: Mood: Anxious and Affect: Appropriate Risk of harm to self or others: No plan to harm self or others  LIFE CONTEXT: Family and Social: Pt lives with her fiancee and 32yo son School/Work: Industrial/product designeriancee working at [employer] Self-Care: Setting boundaries regarding sister Life Changes: Current pregnancy; loss of numerous family members in past 5 years (most recent loss of 19yo brother in 2018 in [place], [place] to [place].   GOALS ADDRESSED: Patient will: 1.  Reduce symptoms of: anxiety and compulsions  2.  Increase knowledge and/or ability of: coping skills  3.  Demonstrate ability to: Increase healthy adjustment to current life circumstances  INTERVENTIONS: Interventions utilized:  Solution-Focused Strategies Standardized Assessments completed: GAD-7  and PHQ 9  ASSESSMENT: Patient currently experiencing Excoriation disorder.   Patient may benefit from psychoeducation and brief therapeutic interventions regarding coping with symptoms of anxiety, as it relates to Excoriation disorder .  PLAN: 1. Follow up with behavioral health clinician on : Two weeks 2. Behavioral recommendations:  -Discuss with medical provider at next visit, alternatives to daily diabetes stick -Consider BH medications to help reduce urge to pick; discuss with medical provider -Continue using daily self-coping strategy, as discussed in office visit 3. Referral(s): Integrated Behavioral Health Services (In Clinic) 4. "From scale of 1-10, how likely are you to follow plan?": 10  Christina LipsJamie C McMannes, LCSW   [facility] screen [facility] 2/9 03/29/2018 03/22/2018 03/15/2018 03/02/2018 02/15/2018  Decreased Interest 2 1 1 1  0  Down, Depressed, Hopeless 2 2 2 1 1   PHQ - 2 Score 4 3 3 2 1   Altered sleeping 1 1 2 1 1   Tired, decreased energy 2 2 2 1 1   Change in appetite 0 0 1 0 0  Feeling bad or failure about yourself  2 1 2 1  0  Trouble concentrating 0 0 0 0 0  Moving slowly or fidgety/restless 0 0 0 0 0  Suicidal thoughts 0 0 0 0 0  PHQ-9 Score 9 7 10 5 3   Difficult doing work/chores - Very difficult - - -   GAD 7 : Generalized Anxiety Score 03/29/2018 03/15/2018 03/02/2018 02/15/2018  Nervous, Anxious, on Edge 3 2 0 0  Control/stop worrying 3 2 1 1   Worry too much - different things 3 2 1 1   Trouble relaxing 2 1 0 0  Restless 0 0 0 0  Easily annoyed or irritable  1 1 0 0  Afraid - awful might happen 2 2 1 1   Total GAD 7 Score 14 10 3  3

## 2018-04-09 ENCOUNTER — Ambulatory Visit (INDEPENDENT_AMBULATORY_CARE_PROVIDER_SITE_OTHER): Payer: Medicaid Other | Admitting: Clinical

## 2018-04-09 ENCOUNTER — Other Ambulatory Visit: Payer: Self-pay

## 2018-04-09 DIAGNOSIS — O0990 Supervision of high risk pregnancy, unspecified, unspecified trimester: Secondary | ICD-10-CM

## 2018-04-09 DIAGNOSIS — F424 Excoriation (skin-picking) disorder: Secondary | ICD-10-CM

## 2018-04-09 DIAGNOSIS — O24919 Unspecified diabetes mellitus in pregnancy, unspecified trimester: Secondary | ICD-10-CM

## 2018-04-09 MED ORDER — "INSULIN SYRINGE 31G X 5/16"" 0.5 ML MISC": 6 refills | Status: DC

## 2018-04-09 NOTE — Telephone Encounter (Signed)
Pt called and stated that she is about to run out of her test strips and her insulin syringes.  Pt stated that she was giving insulin once a day but now she gives insulin twice a day.  Pt reports that she has been has checking her BS more than qid depending on how she is feeling.  I explained to the pt that is the reason she has run out of test strips.  I advised pt to try to maintain checking her BS as prescribed.  I informed pt that I would change the order for her syringes to 60 to cover her twice a day and I will contact her pharmacy in regards to getting enough test strips to cover her.  Programme researcher, broadcasting[facility] AmendCourtney who informed me that the pt can get qty 25 test strips for $15 which will last her until her next refill in four days.    Called pt and notified the pt that a new Rx for syringes have been sent to her pharmacy.  I also informed her that she can get enough test strips until her next refill for $15.  Pt stated that she was able to find another box of test strips so she was okay with that.  Pt did not have any other questions.

## 2018-04-12 ENCOUNTER — Ambulatory Visit (HOSPITAL_COMMUNITY): Admission: RE | Admit: 2018-04-12 | Payer: Medicaid Other | Source: Ambulatory Visit

## 2018-04-12 MED ORDER — "INSULIN SYRINGE-NEEDLE U-100 31G X 5/16"" 0.5 ML MISC": 1.0000 | Freq: Two times a day (BID) | 12 refills | Status: DC

## 2018-04-12 NOTE — Telephone Encounter (Signed)
Pt called stating that she could not get her insulin needles because the instructions for use was not clear for the pharmacy. Advised pt that I would fix the issue and send rx to pharmacy. Pt verbalized understanding and had no questions.

## 2018-04-15 ENCOUNTER — Encounter (HOSPITAL_COMMUNITY): Payer: Self-pay

## 2018-04-15 ENCOUNTER — Ambulatory Visit (HOSPITAL_COMMUNITY)
Admission: RE | Admit: 2018-04-15 | Discharge: 2018-04-15 | Disposition: A | Discharge status: Home / Self Care | Payer: Medicaid Other | Source: Ambulatory Visit | Attending: Family Medicine | Admitting: Family Medicine

## 2018-04-15 DIAGNOSIS — Z3A23 23 weeks gestation of pregnancy: Secondary | ICD-10-CM | POA: Diagnosis not present

## 2018-04-15 DIAGNOSIS — Z362 Encounter for other antenatal screening follow-up: Secondary | ICD-10-CM | POA: Diagnosis not present

## 2018-04-15 DIAGNOSIS — O99212 Obesity complicating pregnancy, second trimester: Secondary | ICD-10-CM | POA: Diagnosis not present

## 2018-04-15 DIAGNOSIS — Z363 Encounter for antenatal screening for malformations: Secondary | ICD-10-CM

## 2018-04-15 DIAGNOSIS — O24112 Pre-existing diabetes mellitus, type 2, in pregnancy, second trimester: Secondary | ICD-10-CM | POA: Diagnosis not present

## 2018-04-15 DIAGNOSIS — O34219 Maternal care for unspecified type scar from previous cesarean delivery: Secondary | ICD-10-CM | POA: Diagnosis not present

## 2018-04-15 DIAGNOSIS — O24119 Pre-existing diabetes mellitus, type 2, in pregnancy, unspecified trimester: Secondary | ICD-10-CM

## 2018-04-16 ENCOUNTER — Other Ambulatory Visit (HOSPITAL_COMMUNITY): Payer: Self-pay | Admitting: *Deleted

## 2018-04-16 DIAGNOSIS — E119 Type 2 diabetes mellitus without complications: Secondary | ICD-10-CM

## 2018-04-23 ENCOUNTER — Ambulatory Visit (INDEPENDENT_AMBULATORY_CARE_PROVIDER_SITE_OTHER): Payer: Medicaid Other | Admitting: Obstetrics and Gynecology

## 2018-04-23 VITALS — BP 108/72 | HR 89 | Wt 246.0 lb

## 2018-04-23 DIAGNOSIS — O24912 Unspecified diabetes mellitus in pregnancy, second trimester: Secondary | ICD-10-CM

## 2018-04-23 DIAGNOSIS — R634 Abnormal weight loss: Secondary | ICD-10-CM

## 2018-04-23 DIAGNOSIS — O0992 Supervision of high risk pregnancy, unspecified, second trimester: Secondary | ICD-10-CM

## 2018-04-23 DIAGNOSIS — O24919 Unspecified diabetes mellitus in pregnancy, unspecified trimester: Secondary | ICD-10-CM

## 2018-04-23 DIAGNOSIS — O0990 Supervision of high risk pregnancy, unspecified, unspecified trimester: Secondary | ICD-10-CM

## 2018-04-23 DIAGNOSIS — Z98891 History of uterine scar from previous surgery: Secondary | ICD-10-CM

## 2018-04-23 NOTE — Progress Notes (Signed)
Prenatal Visit Note Date: 04/23/2018 Clinic: [facility]  Subjective:  Christina Morton is a 33 y.o. G2P1001 at [redacted]w[redacted]d being seen today for ongoing prenatal care.  She is currently monitored for the following issues for this high-risk pregnancy and has Supervision of high risk pregnancy, antepartum; History of cesarean section, classical; Urachal cyst; Family history of heart disease; Diabetes mellitus complicating pregnancy, antepartum; Depression; and Glaucoma on their problem list.  Patient reports no complaints.   Contractions: Not present. Vag. Bleeding: None.  Movement: Present. Denies leaking of fluid.   The following portions of the patient's history were reviewed and updated as appropriate: allergies, current medications, past family history, past medical history, past social history, past surgical history and problem list. Problem list updated.  Objective:   Vitals:   04/23/18 1033  BP: 108/72  Pulse: 89  Weight: 246 lb (111.6 kg)    Fetal Status: Fetal Heart Rate (bpm): 154   Movement: Present     General:  Alert, oriented and cooperative. Patient is in no acute distress.  Skin: Skin is warm and dry. No rash noted.   Cardiovascular: Normal heart rate noted  Respiratory: Normal respiratory effort, no problems with respiration noted  Abdomen: Soft, gravid, appropriate for gestational age. Pain/Pressure: Present     Pelvic:  Cervical exam deferred        Extremities: Normal range of motion.  Edema: None  Mental Status: Normal mood and affect. Normal behavior. Normal judgment and thought content.   Urinalysis:      Assessment and Plan:  Pregnancy: G2P1001 at [redacted]w[redacted]d  1. Diabetes mellitus complicating pregnancy, antepartum NPH 10 with breakfast and 15 qhs and metformin 1000 right after breakfast and 1000 right after dinner. Normal BS log. D/w her not sure why she's losing weight as she denies any n/v of pregnancy s/s and she reviewed her diet with me and it's  actually probably a little too carb heavy with chips and french fries on her diet. I told her to try to eat a little more and snack more frequently and keep an eye on her sugars. Pt declines to see nutrtion.   2. Supervision of high risk pregnancy, antepartum Routine care. btl paper signed today  3. History of cesarean section, classical Schedule for 37wks  Preterm labor symptoms and general obstetric precautions including but not limited to vaginal bleeding, contractions, leaking of fluid and fetal movement were reviewed in detail with the patient. Please refer to After Visit Summary for other counseling recommendations.  Return in about 2 weeks (around 05/07/2018) for 2-3wks hrob.   Higden Bing, MD  10/15 qhs 1 after food and 1 after dinner

## 2018-04-26 ENCOUNTER — Ambulatory Visit: Payer: Self-pay

## 2018-04-26 NOTE — BH Specialist Note (Deleted)
Integrated Behavioral Health Follow Up Visit  MRN: [id number] Name: Christina Morton  Number of Integrated Behavioral Health Clinician visits: 4/6 Session Start time: ***  Session End time: *** Total time: {IBH Total Time:21014050}  Type of Service: Integrated Behavioral Health- Individual/Family Interpretor:{yes [id number]} Interpretor Name and Language: ***  SUBJECTIVE: Gabija Schiffler is a 33 y.o. female accompanied by {Patient accompanied by:[phone]} Patient was referred by *** for ***. Patient reports the following symptoms/concerns: *** Duration of problem: ***; Severity of problem: {Mild/Moderate/Severe:20260}  OBJECTIVE: Mood: {BHH MOOD:22306} and Affect: {BHH AFFECT:22307} Risk of harm to self or others: {CHL AMB BH Suicide Current Mental Status:21022748}  LIFE CONTEXT: Family and Social: *** School/Work: *** Self-Care: *** Life Changes: ***  GOALS ADDRESSED: Patient will: 1.  Reduce symptoms of: {IBH Symptoms:21014056}  2.  Increase knowledge and/or ability of: {IBH Patient Tools:21014057}  3.  Demonstrate ability to: {IBH Goals:21014053}  INTERVENTIONS: Interventions utilized:  {IBH Interventions:21014054} Standardized Assessments completed: {IBH Screening Tools:21014051}  ASSESSMENT: Patient currently experiencing ***.   Patient may benefit from ***.  PLAN: 1. Follow up with behavioral health clinician on : *** 2. Behavioral recommendations: *** 3. Referral(s): {IBH Referrals:21014055} 4. "From scale of 1-10, how likely are you to follow plan?": ***  [name] C McMannes, LCSW  ***habit reversal

## 2018-05-06 ENCOUNTER — Encounter: Payer: Self-pay | Admitting: *Deleted

## 2018-05-07 ENCOUNTER — Telehealth: Payer: Self-pay

## 2018-05-07 NOTE — Telephone Encounter (Signed)
Pt called stated that she is having pressure in her pelvic area.  I called pt and informed her that pressure in lower abd is normal.  I recommended that if it gets uncomfortable for her that she can [place] a abdominal binder.  Pt stated understanding.

## 2018-05-10 ENCOUNTER — Ambulatory Visit (INDEPENDENT_AMBULATORY_CARE_PROVIDER_SITE_OTHER): Payer: Medicaid Other | Admitting: Family Medicine

## 2018-05-10 VITALS — BP 119/84 | HR 106 | Wt 248.0 lb

## 2018-05-10 DIAGNOSIS — O0990 Supervision of high risk pregnancy, unspecified, unspecified trimester: Secondary | ICD-10-CM

## 2018-05-10 DIAGNOSIS — K5901 Slow transit constipation: Secondary | ICD-10-CM

## 2018-05-10 DIAGNOSIS — O0992 Supervision of high risk pregnancy, unspecified, second trimester: Secondary | ICD-10-CM

## 2018-05-10 DIAGNOSIS — Z98891 History of uterine scar from previous surgery: Secondary | ICD-10-CM

## 2018-05-10 DIAGNOSIS — O24912 Unspecified diabetes mellitus in pregnancy, second trimester: Secondary | ICD-10-CM

## 2018-05-10 DIAGNOSIS — Z3A26 26 weeks gestation of pregnancy: Secondary | ICD-10-CM

## 2018-05-10 DIAGNOSIS — O24919 Unspecified diabetes mellitus in pregnancy, unspecified trimester: Secondary | ICD-10-CM

## 2018-05-10 LAB — POCT URINALYSIS DIP (DEVICE)
Glucose, UA: NEGATIVE mg/dL
Hgb urine dipstick: NEGATIVE
Nitrite: NEGATIVE
Protein, ur: 30 mg/dL — AB
Specific Gravity, Urine: 1.025 (ref 1.005–1.030)
Urobilinogen, UA: 1 mg/dL (ref 0.0–1.0)
pH: 5.5 (ref 5.0–8.0)

## 2018-05-10 MED ORDER — INSULIN ASPART 100 UNIT/ML ~~LOC~~ SOLN: 5.0000 [IU] | Freq: Every day | SUBCUTANEOUS | 12 refills | Status: DC

## 2018-05-10 NOTE — Progress Notes (Signed)
Subjective:  Christina Morton is a 33 y.o. G2P1001 at [redacted]w[redacted]d being seen today for ongoing prenatal care.  She is currently monitored for the following issues for this high-risk pregnancy and has Supervision of high risk pregnancy, antepartum; History of cesarean section, classical; Urachal cyst; Family history of heart disease; Diabetes mellitus complicating pregnancy, antepartum; Depression; Glaucoma; and Weight loss on their problem list.  GDM: Patient taking insulin.  Reports no hypoglycemic episodes.  Tolerating medication well Fasting: controlled 2hr PP: 67-154 - almost every pp cbg after dinner elevated.  Patient reports constipation.  Contractions: Not present. Vag. Bleeding: None.  Movement: Present. Denies leaking of fluid.   The following portions of the patient's history were reviewed and updated as appropriate: allergies, current medications, past family history, past medical history, past social history, past surgical history and problem list. Problem list updated.  Objective:   Vitals:   05/10/18 1549  BP: 119/84  Pulse: (!) 106  Weight: 248 lb (112.5 kg)    Fetal Status: Fetal Heart Rate (bpm): 152   Movement: Present     General:  Alert, oriented and cooperative. Patient is in no acute distress.  Skin: Skin is warm and dry. No rash noted.   Cardiovascular: Normal heart rate noted  Respiratory: Normal respiratory effort, no problems with respiration noted  Abdomen: Soft, gravid, appropriate for gestational age. Pain/Pressure: Present     Pelvic: Vag. Bleeding: None     Cervical exam deferred        Extremities: Normal range of motion.  Edema: None  Mental Status: Normal mood and affect. Normal behavior. Normal judgment and thought content.   Urinalysis:      Assessment and Plan:  Pregnancy: G2P1001 at [redacted]w[redacted]d  1. Supervision of high risk pregnancy, antepartum FHT  2. History of cesarean section, classical Rpt c/s at 37 weeks  3. Diabetes mellitus complicating  pregnancy, antepartum Start aspart 5 units prior to dinner Growth [place] 1/24 Start antenatal testing at 32 weeks.  4. Slow transit constipation miralax BID   Preterm labor symptoms and general obstetric precautions including but not limited to vaginal bleeding, contractions, leaking of fluid and fetal movement were reviewed in detail with the patient. Please refer to After Visit Summary for other counseling recommendations.  No follow-ups on file.   Levie Heritage, DO

## 2018-05-12 ENCOUNTER — Encounter: Payer: Self-pay | Admitting: Family Medicine

## 2018-05-14 ENCOUNTER — Other Ambulatory Visit (HOSPITAL_COMMUNITY): Payer: Self-pay | Admitting: *Deleted

## 2018-05-14 ENCOUNTER — Ambulatory Visit (HOSPITAL_COMMUNITY)
Admission: RE | Admit: 2018-05-14 | Discharge: 2018-05-14 | Disposition: A | Discharge status: Home / Self Care | Payer: Medicaid Other | Source: Ambulatory Visit | Attending: Family Medicine | Admitting: Family Medicine

## 2018-05-14 ENCOUNTER — Encounter (HOSPITAL_COMMUNITY): Payer: Self-pay

## 2018-05-14 ENCOUNTER — Ambulatory Visit: Payer: Self-pay

## 2018-05-14 DIAGNOSIS — O24112 Pre-existing diabetes mellitus, type 2, in pregnancy, second trimester: Secondary | ICD-10-CM | POA: Diagnosis not present

## 2018-05-14 DIAGNOSIS — Z3A27 27 weeks gestation of pregnancy: Secondary | ICD-10-CM | POA: Diagnosis not present

## 2018-05-14 DIAGNOSIS — O34219 Maternal care for unspecified type scar from previous cesarean delivery: Secondary | ICD-10-CM

## 2018-05-14 DIAGNOSIS — E119 Type 2 diabetes mellitus without complications: Secondary | ICD-10-CM

## 2018-05-14 DIAGNOSIS — O24119 Pre-existing diabetes mellitus, type 2, in pregnancy, unspecified trimester: Secondary | ICD-10-CM

## 2018-05-24 ENCOUNTER — Ambulatory Visit (INDEPENDENT_AMBULATORY_CARE_PROVIDER_SITE_OTHER): Payer: Medicaid Other | Admitting: Obstetrics & Gynecology

## 2018-05-24 ENCOUNTER — Ambulatory Visit (INDEPENDENT_AMBULATORY_CARE_PROVIDER_SITE_OTHER): Payer: Medicaid Other | Admitting: Clinical

## 2018-05-24 VITALS — BP 125/72 | HR 106 | Wt 249.0 lb

## 2018-05-24 DIAGNOSIS — Z23 Encounter for immunization: Secondary | ICD-10-CM | POA: Diagnosis not present

## 2018-05-24 DIAGNOSIS — O99213 Obesity complicating pregnancy, third trimester: Secondary | ICD-10-CM | POA: Diagnosis not present

## 2018-05-24 DIAGNOSIS — O0990 Supervision of high risk pregnancy, unspecified, unspecified trimester: Secondary | ICD-10-CM

## 2018-05-24 DIAGNOSIS — F424 Excoriation (skin-picking) disorder: Secondary | ICD-10-CM

## 2018-05-24 DIAGNOSIS — O0993 Supervision of high risk pregnancy, unspecified, third trimester: Secondary | ICD-10-CM

## 2018-05-24 DIAGNOSIS — Z98891 History of uterine scar from previous surgery: Secondary | ICD-10-CM

## 2018-05-24 DIAGNOSIS — O24919 Unspecified diabetes mellitus in pregnancy, unspecified trimester: Secondary | ICD-10-CM

## 2018-05-24 DIAGNOSIS — O24913 Unspecified diabetes mellitus in pregnancy, third trimester: Secondary | ICD-10-CM | POA: Diagnosis not present

## 2018-05-24 DIAGNOSIS — O99210 Obesity complicating pregnancy, unspecified trimester: Secondary | ICD-10-CM | POA: Insufficient documentation

## 2018-05-24 NOTE — Progress Notes (Signed)
   PRENATAL VISIT NOTE  Subjective:  Christina Morton is a 33 y.o. G2P1001 at [redacted]w[redacted]d being seen today for ongoing prenatal care.  She is currently monitored for the following issues for this high-risk pregnancy and has Supervision of high risk pregnancy, antepartum; History of cesarean section, classical; Urachal cyst; Family history of heart disease; Diabetes mellitus complicating pregnancy, antepartum; Depression; Glaucoma; Weight loss; and Obesity in pregnancy on their problem list.  Patient reports no complaints except can't get to sleep. She has to use Tylenol pm to get to sleep.   Contractions: Not present. Vag. Bleeding: None.  Movement: Present. Denies leaking of fluid.   The following portions of the patient's history were reviewed and updated as appropriate: allergies, current medications, past family history, past medical history, past social history, past surgical history and problem list. Problem list updated.  Objective:   Vitals:   05/24/18 1330  BP: 125/72  Pulse: (!) 106  Weight: 249 lb (112.9 kg)   Fairly good sugar values- only exceptions are due to family eating situations.   Fetal Status: Fetal Heart Rate (bpm): 154   Movement: Present     General:  Alert, oriented and cooperative. Patient is in no acute distress.  Skin: Skin is warm and dry. No rash noted.   Cardiovascular: Normal heart rate noted  Respiratory: Normal respiratory effort, no problems with respiration noted  Abdomen: Soft, gravid, appropriate for gestational age.  Pain/Pressure: Present     Pelvic: Cervical exam deferred        Extremities: Normal range of motion.  Edema: None  Mental Status: Normal mood and affect. Normal behavior. Normal judgment and thought content.   Assessment and Plan:  Pregnancy: G2P1001 at [redacted]w[redacted]d  1. History of cesarean section, classical - repeat c/s about 37 weeks  2. Diabetes mellitus complicating pregnancy, antepartum  - MFM u/s follow up on the 28th - Hemoglobin  A1c --TDAPtoday  3. Supervision of high risk pregnancy, antepartum  - CBC - HIV Antibody (routine testing w rflx) - RPR  4. Obesity in pregnancy   Preterm labor symptoms and general obstetric precautions including but not limited to vaginal bleeding, contractions, leaking of fluid and fetal movement were reviewed in detail with the patient. Please refer to After Visit Summary for other counseling recommendations.  No follow-ups on file.  Future Appointments  Date Time Provider Department Center  05/24/2018  2:00 PM [facility] [place] [facility]  06/18/2018  1:30 PM [facility]-MFC [place] 1 [facility]-M[facility]US [facility]-US  08/23/2018 12:30 PM Sherrie George, MD [facility]-TRE None    Allie Bossier, MD

## 2018-05-24 NOTE — BH Specialist Note (Signed)
Integrated Behavioral Health Follow Up Visit  MRN: [id number] Name: Christina Morton  Number of Integrated Behavioral Health Clinician visits: 4/6 Session Start time: 2:59  Session End time: 3:18 Total time: 20 minutes  Type of Service: Integrated Behavioral Health- Individual/Family Interpretor:No. Interpretor Name and Language: n/a  SUBJECTIVE: Christina Morton is a 33 y.o. female accompanied by n/a Patient was referred by Christina Celeste, DO, at previous visit for history of depression.  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 Pt says the increase in the stress of not sleeping well,  has caused her to pick her skin more, including at least two more areas on her leg. Pt did not like feeling drowsy after taking Tylenol pm, and Benadryl takes 2-3 hours to help with sleep. When pt sleeps, her skin-picking decreases.  Duration of problem: Increase in pregnancy; Severity of problem: moderate  OBJECTIVE: Mood: Anxious and Affect: Appropriate Risk of harm to self or others: No plan to harm self or others  LIFE CONTEXT: Family and Social: Pt lives with her fiance and 7yo son School/Work: Fiance working at [employer] Self-Care: Setting appropriate boundaries in personal relationships Life Changes: Current pregnancy; loss of numerous family members in the past 5 years(most recent loss of 19yo brother in [place]), move to [place]. In past month, sister lost pregnancy and moved back to [place].   GOALS ADDRESSED: Patient will: 1.  Reduce symptoms of: anxiety, depression, insomnia and stress  2.  Increase knowledge and/or ability of: healthy habits and stress reduction  3.  Demonstrate ability to: Increase healthy adjustment to current life circumstances and Increase motivation to adhere to plan of care  INTERVENTIONS: Interventions  utilized:  Solution-Focused Strategies Standardized Assessments completed: GAD-7 and PHQ 9  ASSESSMENT: Patient currently experiencing Excoriation (skin-picking) disorder.   Patient may benefit from continued brief therapeutic interventions regarding coping with symptoms of stress, anxiety and insomnia, related to skin-picking.   PLAN: 1. Follow up with behavioral health clinician on : Two weeks 2. Behavioral recommendations:  -Begin taking Melatonin, as recommended by medical provider; continue for as long as remains helpful for sleep -Take home and complete sleep diary; discuss further at next visit with [facility]  3. Referral(s): Integrated Behavioral Health Services (In Clinic) 4. "From scale of 1-10, how likely are you to follow plan?": -  Rae Lips, LCSW  Depression screen [facility] 2/9 04/23/2018 03/29/2018 03/22/2018 03/15/2018 03/02/2018  Decreased Interest 1 2 1 1 1   Down, Depressed, Hopeless 2 2 2 2 1   PHQ - 2 Score 3 4 3 3 2   Altered sleeping 2 1 1 2 1   Tired, decreased energy 3 2 2 2 1   Change in appetite 0 0 0 1 0  Feeling bad or failure about yourself  2 2 1 2 1   Trouble concentrating 0 0 0 0 0  Moving slowly or fidgety/restless 0 0 0 0 0  Suicidal thoughts 0 0 0 0 0  PHQ-9 Score 10 9 7 10 5   Difficult doing work/chores - - Very difficult - -   GAD 7 : Generalized Anxiety Score 04/23/2018 03/29/2018 03/15/2018 03/02/2018  Nervous, Anxious, on Edge 2 3 2  0  Control/stop worrying 3 3 2 1   Worry too much - different things 3 3 2 1   Trouble relaxing 2 2 1  0  Restless 0 0 0 0  Easily annoyed or irritable 2  1 1 0  Afraid - awful might happen 3 2 2 1   Total GAD 7 Score 15 14 10  3

## 2018-05-24 NOTE — Addendum Note (Signed)
Addended by: Angelina Sheriff on: 05/24/2018 01:59 PM   Modules accepted: Kipp Brood

## 2018-05-25 LAB — CBC
Hematocrit: 30.8 % — ABNORMAL LOW (ref 34.0–46.6)
Hemoglobin: 9.7 g/dL — ABNORMAL LOW (ref 11.1–15.9)
MCH: 21.2 pg — ABNORMAL LOW (ref 26.6–33.0)
MCHC: 31.5 g/dL (ref 31.5–35.7)
MCV: 67 fL — ABNORMAL LOW (ref 79–97)
PLATELETS: 376 10*3/uL (ref 150–450)
RBC: 4.57 x10E6/uL (ref 3.77–5.28)
RDW: 16.5 % — ABNORMAL HIGH (ref 11.7–15.4)
WBC: 7.7 10*3/uL (ref 3.4–10.8)

## 2018-05-25 LAB — RPR: RPR Ser Ql: NONREACTIVE

## 2018-05-25 LAB — HEMOGLOBIN A1C
Est. average glucose Bld gHb Est-mCnc: 111 mg/dL
Hgb A1c MFr Bld: 5.5 % (ref 4.8–5.6)

## 2018-05-25 LAB — HIV ANTIBODY (ROUTINE TESTING W REFLEX): HIV Screen 4th Generation wRfx: NONREACTIVE

## 2018-05-26 ENCOUNTER — Encounter (HOSPITAL_COMMUNITY): Payer: Self-pay

## 2018-05-31 ENCOUNTER — Encounter: Payer: Self-pay | Admitting: Family Medicine

## 2018-06-03 ENCOUNTER — Inpatient Hospital Stay (HOSPITAL_COMMUNITY)
Admission: AD | Admit: 2018-06-03 | Discharge: 2018-06-04 | Disposition: A | Discharge status: Home / Self Care | Payer: Medicaid Other | Source: Ambulatory Visit | Attending: Obstetrics and Gynecology | Admitting: Obstetrics and Gynecology

## 2018-06-03 DIAGNOSIS — M545 Low back pain: Secondary | ICD-10-CM | POA: Insufficient documentation

## 2018-06-03 DIAGNOSIS — Z7982 Long term (current) use of aspirin: Secondary | ICD-10-CM | POA: Insufficient documentation

## 2018-06-03 DIAGNOSIS — N858 Other specified noninflammatory disorders of uterus: Secondary | ICD-10-CM

## 2018-06-03 DIAGNOSIS — N859 Noninflammatory disorder of uterus, unspecified: Secondary | ICD-10-CM

## 2018-06-03 DIAGNOSIS — Z3A30 30 weeks gestation of pregnancy: Secondary | ICD-10-CM | POA: Insufficient documentation

## 2018-06-03 DIAGNOSIS — Z880 Allergy status to penicillin: Secondary | ICD-10-CM | POA: Insufficient documentation

## 2018-06-03 DIAGNOSIS — O26893 Other specified pregnancy related conditions, third trimester: Secondary | ICD-10-CM | POA: Insufficient documentation

## 2018-06-03 DIAGNOSIS — M62830 Muscle spasm of back: Secondary | ICD-10-CM | POA: Insufficient documentation

## 2018-06-03 DIAGNOSIS — Z87891 Personal history of nicotine dependence: Secondary | ICD-10-CM | POA: Insufficient documentation

## 2018-06-03 NOTE — MAU Note (Signed)
Pt reports to MAU c/o pelvic pressure and pain pt states it is unbearable. Pt reports ctxs that are irregular. +FM. No bleeding or LOF. Pt reports lower right sided back pain.

## 2018-06-04 ENCOUNTER — Other Ambulatory Visit: Payer: Self-pay

## 2018-06-04 ENCOUNTER — Encounter (HOSPITAL_COMMUNITY): Payer: Self-pay | Admitting: *Deleted

## 2018-06-04 DIAGNOSIS — Z880 Allergy status to penicillin: Secondary | ICD-10-CM | POA: Diagnosis not present

## 2018-06-04 DIAGNOSIS — Z7982 Long term (current) use of aspirin: Secondary | ICD-10-CM | POA: Diagnosis not present

## 2018-06-04 DIAGNOSIS — Z3A30 30 weeks gestation of pregnancy: Secondary | ICD-10-CM

## 2018-06-04 DIAGNOSIS — M62830 Muscle spasm of back: Secondary | ICD-10-CM | POA: Diagnosis not present

## 2018-06-04 DIAGNOSIS — Z87891 Personal history of nicotine dependence: Secondary | ICD-10-CM | POA: Diagnosis not present

## 2018-06-04 DIAGNOSIS — O26893 Other specified pregnancy related conditions, third trimester: Secondary | ICD-10-CM | POA: Diagnosis not present

## 2018-06-04 DIAGNOSIS — O4703 False labor before 37 completed weeks of gestation, third trimester: Secondary | ICD-10-CM

## 2018-06-04 DIAGNOSIS — M545 Low back pain: Secondary | ICD-10-CM | POA: Diagnosis present

## 2018-06-04 LAB — URINALYSIS, ROUTINE W REFLEX MICROSCOPIC
Bilirubin Urine: NEGATIVE
Glucose, UA: NEGATIVE mg/dL
Hgb urine dipstick: NEGATIVE
Ketones, ur: 15 mg/dL — AB
Leukocytes,Ua: NEGATIVE
Nitrite: NEGATIVE
Protein, ur: NEGATIVE mg/dL
SPECIFIC GRAVITY, URINE: 1.02 (ref 1.005–1.030)
pH: 5.5 (ref 5.0–8.0)

## 2018-06-04 LAB — FETAL FIBRONECTIN: Fetal Fibronectin: NEGATIVE

## 2018-06-04 MED ORDER — CYCLOBENZAPRINE HCL 5 MG PO TABS: 5.0000 mg | ORAL_TABLET | Freq: Three times a day (TID) | ORAL | 0 refills | Status: DC | PRN

## 2018-06-04 MED ORDER — CYCLOBENZAPRINE HCL 5 MG PO TABS
5.0000 mg | ORAL_TABLET | Freq: Once | ORAL | Status: AC
  Administered 2018-06-04: 5 mg via ORAL
  Filled 2018-06-04: qty 1

## 2018-06-04 NOTE — Discharge Instructions (Signed)
Heat Therapy Heat therapy can help ease sore, stiff, injured, and tight muscles and joints. Heat relaxes your muscles, which may help ease your pain and muscle spasms. Do not use heat therapy unless your doctor tells you to use it. How to use heat therapy There are several different kinds of heat therapy, including:  Moist heat pack.  Hot water bottle.  Electric heating pad.  Heated gel pack.  Heated wrap.  Warm water bath. Your doctor will tell you how to use heat therapy. In general, you should: 1. Place a towel between your skin and the heat source. 2. Leave the heat on for 20-30 minutes. Your skin may turn pink. 3. Remove the heat if your skin turns bright red. You should remove the heat source if you are unable to feel pain, heat, or cold. You are more likely to get burned if you leave it on the skin for too long. Your doctor may also tell you to take a warm water bath. To do this: 1. Put a non-slip pad in the bathtub to prevent a fall. 2. Fill the bathtub with warm water. 3. Check the water temperature. 4. Soak in the water for 15-20 minutes, or as told by your doctor. 5. Be careful when you stand up after the bath. You may feel dizzy. 6. Pat yourself dry after the bath. Do not rub your skin to dry it. General recommendations for heat therapy  Do not sleep while using heat therapy. Only use heat therapy while you are awake.  Your skin may turn pink while using heat therapy. Do not use heat therapy if your skin turns red.  Do not use heat therapy if you have a new injury.  High heat or using heat for a long time can cause burns. Be careful not to burn your skin when using heat therapy.  Do not use heat therapy on areas of your skin that are already irritated, such as with a rash or sunburn. Get help if you have:  Blisters, redness, swelling (puffiness), or numbness.  New pain.  Pain that is getting worse. Summary  Heat therapy is the use of heat to help ease sore,  stiff, injured, and tight muscles and joints.  There are different types of heat therapy. Your doctor will tell you which one to use.  Only use heat therapy while you are awake.  Watch your skin to make sure you do not get burned while using heat therapy. This information is not intended to replace advice given to you by your health care provider. Make sure you discuss any questions you have with your health care provider. Document Released: 06/30/2011 Document Revised: 04/18/2017 Document Reviewed: 04/18/2017 Elsevier Interactive Patient Education  2019 [employer]   Back Exercises If you have pain in your back, do these exercises 2-3 times each day or as told by your doctor. When the pain goes away, do the exercises once each day, but repeat the steps more times for each exercise (do more repetitions). If you do not have pain in your back, do these exercises once each day or as told by your doctor. Exercises Single Knee to Chest Do these steps 3-5 times in a row for each leg: 1. Lie on your back on a firm bed or the floor with your legs stretched out. 2. Bring one knee to your chest. 3. Hold your knee to your chest by grabbing your knee or thigh. 4. Pull on your knee until you feel a  gentle stretch in your lower back. 5. Keep doing the stretch for 10-30 seconds. 6. Slowly let go of your leg and straighten it. Pelvic Tilt Do these steps 5-10 times in a row: 1. Lie on your back on a firm bed or the floor with your legs stretched out. 2. Bend your knees so they point up to the ceiling. Your feet should be flat on the floor. 3. Tighten your lower belly (abdomen) muscles to press your lower back against the floor. This will make your tailbone point up to the ceiling instead of pointing down to your feet or the floor. 4. Stay in this position for 5-10 seconds while you gently tighten your muscles and breathe evenly. Cat-Cow Do these steps until your lower back bends more easily: 1. Get  on your hands and knees on a firm surface. Keep your hands under your shoulders, and keep your knees under your hips. You may put padding under your knees. 2. Let your head hang down, and make your tailbone point down to the floor so your lower back is round like the back of a cat. 3. Stay in this position for 5 seconds. 4. Slowly lift your head and make your tailbone point up to the ceiling so your back hangs low (sags) like the back of a cow. 5. Stay in this position for 5 seconds.  Press-Ups Do these steps 5-10 times in a row: 1. Lie on your belly (face-down) on the floor. 2. Place your hands near your head, about shoulder-width apart. 3. While you keep your back relaxed and keep your hips on the floor, slowly straighten your arms to raise the top half of your body and lift your shoulders. Do not use your back muscles. To make yourself more comfortable, you may change where you place your hands. 4. Stay in this position for 5 seconds. 5. Slowly return to lying flat on the floor.  Bridges Do these steps 10 times in a row: 1. Lie on your back on a firm surface. 2. Bend your knees so they point up to the ceiling. Your feet should be flat on the floor. 3. Tighten your butt muscles and lift your butt off of the floor until your waist is almost as high as your knees. If you do not feel the muscles working in your butt and the back of your thighs, slide your feet 1-2 inches farther away from your butt. 4. Stay in this position for 3-5 seconds. 5. Slowly lower your butt to the floor, and let your butt muscles relax. If this exercise is too easy, try doing it with your arms crossed over your chest. Belly Crunches Do these steps 5-10 times in a row: 1. Lie on your back on a firm bed or the floor with your legs stretched out. 2. Bend your knees so they point up to the ceiling. Your feet should be flat on the floor. 3. Cross your arms over your chest. 4. Tip your chin a little bit toward your chest  but do not bend your neck. 5. Tighten your belly muscles and slowly raise your chest just enough to lift your shoulder blades a tiny bit off of the floor. 6. Slowly lower your chest and your head to the floor. Back Lifts Do these steps 5-10 times in a row: 1. Lie on your belly (face-down) with your arms at your sides, and rest your forehead on the floor. 2. Tighten the muscles in your legs and your butt. 3.  Slowly lift your chest off of the floor while you keep your hips on the floor. Keep the back of your head in line with the curve in your back. Look at the floor while you do this. 4. Stay in this position for 3-5 seconds. 5. Slowly lower your chest and your face to the floor. Contact a doctor if:  Your back pain gets a lot worse when you do an exercise.  Your back pain does not lessen 2 hours after you exercise. If you have any of these problems, stop doing the exercises. Do not do them again unless your doctor says it is okay. Get help right away if:  You have sudden, very bad back pain. If this happens, stop doing the exercises. Do not do them again unless your doctor says it is okay. This information is not intended to replace advice given to you by your health care provider. Make sure you discuss any questions you have with your health care provider. Document Released: 05/10/2010 Document Revised: 12/30/2017 Document Reviewed: 06/01/2014 Elsevier Interactive Patient Education  [employer].

## 2018-06-04 NOTE — MAU Provider Note (Signed)
Chief Complaint:  Pelvic Pain; Contractions; and Back Pain   First Provider Initiated Contact with Patient 06/04/18 0011     HPI: Christina Morton is a 33 y.o. G2P1001 at 4330w2dwho presents to maternity admissions reporting pain in right side/back (around waistline height).  States has frequent contractions.. She reports good fetal movement, denies LOF, vaginal bleeding, vaginal itching/burning, urinary symptoms, h/a, dizziness, n/v, diarrhea, constipation or fever/chills.  She denies headache, visual changes or RUQ abdominal pain  Pelvic Pain  The patient's primary symptoms include pelvic pain. The patient's pertinent negatives include no genital itching, genital lesions, genital odor, vaginal bleeding or vaginal discharge. The problem occurs intermittently. The problem has been unchanged. The pain is mild. The problem affects both sides. She is pregnant. Associated symptoms include back pain. Pertinent negatives include no constipation or diarrhea. Nothing aggravates the symptoms. She has tried nothing for the symptoms.  Back Pain  This is a new problem. The current episode started yesterday. The problem occurs constantly. The problem is unchanged. The pain is present in the lumbar spine. The quality of the pain is described as aching. The pain does not radiate. The symptoms are aggravated by bending and twisting. Associated symptoms include pelvic pain. She has tried nothing for the symptoms.   .  RN Note: Pt reports to MAU c/o pelvic pressure and pain pt states it is unbearable. Pt reports ctxs that are irregular. +FM. No bleeding or LOF. Pt reports lower right sided back pain.  Past Medical History: Past Medical History:  Diagnosis Date  . Anemia   . Depression   . Glaucoma   . Mental disorder   . Type 2 diabetes mellitus without complications (HCC)     Past obstetric history: OB History  Gravida Para Term Preterm AB Living  2 1 1  0 0 1  SAB TAB Ectopic Multiple Live Births  0 0 0 0  1    # Outcome Date GA Lbr Len/2nd Weight Sex Delivery Anes PTL Lv  2 Current           1 Term 10/30/10 [id number]   M CS-Classical   LIV    Past Surgical History: Past Surgical History:  Procedure Laterality Date  . CESAREAN SECTION    . CYSTECTOMY      Family History: Family History  Problem Relation Age of Onset  . Hypertrophic cardiomyopathy Mother   . Bradycardia Mother     Social History: Social History   Tobacco Use  . Smoking status: Former Smoker    Packs/day: 1.00    Years: 1.00    Pack years: 1.00    Types: Cigarettes    Last attempt to quit: 11/22/2017    Years since quitting: 0.5  . Smokeless tobacco: Never Used  Substance Use Topics  . Alcohol use: Never    Frequency: Never  . Drug use: Never    Allergies:  Allergies  Allergen Reactions  . Amoxicillin Anaphylaxis  . Penicillins Anaphylaxis    Meds:  Medications Prior to Admission  Medication Sig Dispense Refill Last Dose  . ACCU-CHEK FASTCLIX LANCETS MISC 1 Device by Percutaneous route 4 (four) times daily. 100 each 12 Taking  . aspirin EC 81 MG tablet Take 1 tablet (81 mg total) by mouth daily. Take after 12 weeks for prevention of preeclampsia later in pregnancy 300 tablet 2 Taking  . ferrous sulfate 325 (65 FE) MG EC tablet Take 325 mg by mouth every other day.   Not Taking  . glucose  blood (ACCU-CHEK GUIDE) test strip Use as instructed QID 100 each 12 Taking  . insulin aspart (NOVOLOG) 100 UNIT/ML injection Inject 5 Units into the skin daily before supper. 10 mL 12 Taking  . insulin NPH Human (HUMULIN N,NOVOLIN N) 100 UNIT/ML injection Inject 0.1 mLs (10 Units total) into the skin at bedtime. (Patient taking differently: Inject 10-15 Units into the skin 2 (two) times daily. 10 units Before breakfast and 15 units at bedtime) 10 mL 3 Taking  . Insulin Syringe-Needle U-100 (INSULIN SYRINGE .5CC/31GX5/16") 31G X 5/16" 0.5 ML MISC Use as directed 100 each 6 Taking  . Insulin Syringe-Needle U-100 31G X  5/16" 0.5 ML MISC Inject 1 Syringe into the skin 2 (two) times daily. 100 each 12 Taking  . metFORMIN (GLUCOPHAGE) 1000 MG tablet Take 1 tablet (1,000 mg total) by mouth 2 (two) times daily with a meal. 60 tablet 5 Taking  . Prenatal Multivit-Min-Fe-FA (PRENATAL VITAMINS) 0.8 MG tablet Take 1 tablet by mouth daily. 30 tablet 12 Taking    I have reviewed patient's Past Medical Hx, Surgical Hx, Family Hx, Social Hx, medications and allergies.   ROS:  Review of Systems  Gastrointestinal: Negative for constipation and diarrhea.  Genitourinary: Positive for pelvic pain. Negative for vaginal discharge.  Musculoskeletal: Positive for back pain.   Other systems negative  Physical Exam   Patient Vitals for the past 24 hrs:  BP Temp Temp src Pulse Resp Weight  06/03/18 2359 108/72 98.3 F (36.8 C) Oral (!) 106 19 114.3 kg   Constitutional: Well-developed, well-nourished female in no acute distress.  Cardiovascular: normal rate and rhythm Respiratory: normal effort, clear to auscultation bilaterally GI: Abd soft, non-tender, gravid appropriate for gestational age.   No rebound or guarding. MS: Extremities nontender, no edema, normal ROM Neurologic: Alert and oriented x 4.  GU: Neg CVAT.  PELVIC EXAM:   Dilation: Closed Effacement (%): Thick Cervical Position: Posterior Station: Ballotable Exam by:: Artelia Laroche CNM  FHT:  Baseline 140 , moderate variability, accelerations present, no decelerations Contractions: Infrequent and Irregular     Labs: Results for orders placed or performed during the hospital encounter of 06/03/18 (from the past 24 hour(s))  Urinalysis, Routine w reflex microscopic     Status: Abnormal   Collection Time: 06/04/18 12:26 AM  Result Value Ref Range   Color, Urine YELLOW YELLOW   APPearance CLEAR CLEAR   Specific Gravity, Urine 1.020 1.005 - 1.030   pH 5.5 5.0 - 8.0   Glucose, UA NEGATIVE NEGATIVE mg/dL   Hgb urine dipstick NEGATIVE NEGATIVE   Bilirubin  Urine NEGATIVE NEGATIVE   Ketones, ur 15 (A) NEGATIVE mg/dL   Protein, ur NEGATIVE NEGATIVE mg/dL   Nitrite NEGATIVE NEGATIVE   Leukocytes,Ua NEGATIVE NEGATIVE  Fetal fibronectin     Status: None   Collection Time: 06/04/18  1:00 AM  Result Value Ref Range   Fetal Fibronectin NEGATIVE NEGATIVE   AB/Positive/-- (09/19 0000)  Imaging:    MAU Course/MDM: I have ordered labs and reviewed results. Fetal fibronectin done due to patient reporting frequent and daily contractions.  I did not see many contractions on monitor.  Fetal fibronectin was negative.  NST reviewed and is reactive    Treatments in MAU included Flexeril which completely resolved the side/back pain.    Assessment: Single intrauterine pregnancy at [redacted]w[redacted]d Reported preterm contractions, negative fetal fibronectin Back spasm  Plan: Discharge home Preterm Labor precautions and fetal kick counts Rx Flexeril for prn use for back spasms  Follow up in Office for prenatal visits and recheck  Encouraged to return here or to other Urgent Care/ED if she develops worsening of symptoms, increase in pain, fever, or other concerning symptoms.   Pt stable at time of discharge.  Wynelle Bourgeois CNM, MSN Certified Nurse-Midwife 06/04/2018 12:11 AM

## 2018-06-07 ENCOUNTER — Ambulatory Visit (INDEPENDENT_AMBULATORY_CARE_PROVIDER_SITE_OTHER): Payer: Medicaid Other | Admitting: Clinical

## 2018-06-07 ENCOUNTER — Encounter: Payer: Self-pay | Admitting: Family Medicine

## 2018-06-07 ENCOUNTER — Ambulatory Visit (INDEPENDENT_AMBULATORY_CARE_PROVIDER_SITE_OTHER): Payer: Medicaid Other | Admitting: Family Medicine

## 2018-06-07 VITALS — BP 112/77 | HR 111 | Wt 248.9 lb

## 2018-06-07 DIAGNOSIS — O0993 Supervision of high risk pregnancy, unspecified, third trimester: Secondary | ICD-10-CM

## 2018-06-07 DIAGNOSIS — O0990 Supervision of high risk pregnancy, unspecified, unspecified trimester: Secondary | ICD-10-CM

## 2018-06-07 DIAGNOSIS — O99213 Obesity complicating pregnancy, third trimester: Secondary | ICD-10-CM

## 2018-06-07 DIAGNOSIS — O24913 Unspecified diabetes mellitus in pregnancy, third trimester: Secondary | ICD-10-CM

## 2018-06-07 DIAGNOSIS — F424 Excoriation (skin-picking) disorder: Secondary | ICD-10-CM | POA: Diagnosis not present

## 2018-06-07 DIAGNOSIS — O24919 Unspecified diabetes mellitus in pregnancy, unspecified trimester: Secondary | ICD-10-CM

## 2018-06-07 DIAGNOSIS — Z3A30 30 weeks gestation of pregnancy: Secondary | ICD-10-CM

## 2018-06-07 DIAGNOSIS — O99210 Obesity complicating pregnancy, unspecified trimester: Secondary | ICD-10-CM

## 2018-06-07 DIAGNOSIS — Z98891 History of uterine scar from previous surgery: Secondary | ICD-10-CM

## 2018-06-07 LAB — POCT URINALYSIS DIP (DEVICE)
Bilirubin Urine: NEGATIVE
Glucose, UA: NEGATIVE mg/dL
Hgb urine dipstick: NEGATIVE
Ketones, ur: NEGATIVE mg/dL
Nitrite: NEGATIVE
Protein, ur: NEGATIVE mg/dL
Urobilinogen, UA: 1 mg/dL (ref 0.0–1.0)
pH: 6 (ref 5.0–8.0)

## 2018-06-07 NOTE — BH Specialist Note (Signed)
Integrated Behavioral Health Follow Up Visit  MRN: [id number] Name: Christina Morton  Number of Integrated Behavioral Health Clinician visits: 5/6 Session Start time: 10:22 Session End time: 10:45 Total time: 20 minutes  Type of Service: Integrated Behavioral Health- Individual/Family Interpretor:No. Interpretor Name and Language: n/a  SUBJECTIVE: Christina Morton is a 33 y.o. female accompanied by n/a Patient was referred  for follow up. Patient reports the following symptoms/concerns: Pt states her primary concern today is lack of sleep. Pt says melatonin worked well at first, but uncertain about taking more than 5mg /night. Pt also concerned about being able to keep up with her 7yo son's activities. Pt says the skin-picking has stayed the same.  Duration of problem: Current pregnancy; Severity of problem: moderate  OBJECTIVE: Mood: Anxious and Affect: Appropriate Risk of harm to self or others: No plan to harm self or others  LIFE CONTEXT: Family and Social: Pt lives with fiance and 7yo son School/Work: Fiance working at [employer] Self-Care: Sets appropriate boundaries in most personal relationships (difficulty setting with son) Life Changes: Current pregnancy; loss of many family members in past 5 years (including 19yo brother in [place]), move to [place]. Sister lost pregnancy and moved back to [place].   GOALS ADDRESSED: Patient will: 1.  Reduce symptoms of: anxiety, depression, insomnia and stress  2.  Increase knowledge and/or ability of: stress reduction  3.  Demonstrate ability to: Increase motivation to adhere to plan of care  INTERVENTIONS: Interventions utilized:  Motivational Interviewing Standardized Assessments completed: GAD-7 and PHQ 9  ASSESSMENT: Patient currently experiencing Excoriation (skin picking) disorder.   Patient may benefit from continued brief therapeutic intervention.  PLAN: 1. Follow up with behavioral health clinician on : As needed 2. Behavioral  recommendations:  -Increase melatonin to 10mg  nightly, as recommended by medical provider  3. Referral(s): Integrated Behavioral Health Services (In Clinic) 4. "From scale of 1-10, how likely are you to follow plan?": -  Rae Lips, LCSW  Depression screen [facility] 2/9 06/07/2018 04/23/2018 03/29/2018 03/22/2018 03/15/2018  Decreased Interest 2 1 2 1 1   Down, Depressed, Hopeless 3 2 2 2 2   PHQ - 2 Score 5 3 4 3 3   Altered sleeping 3 2 1 1 2   Tired, decreased energy 3 3 2 2 2   Change in appetite 1 0 0 0 1  Feeling bad or failure about yourself  2 2 2 1 2   Trouble concentrating 0 0 0 0 0  Moving slowly or fidgety/restless 0 0 0 0 0  Suicidal thoughts 0 0 0 0 0  PHQ-9 Score 14 10 9 7 10   Difficult doing work/chores - - - Very difficult -   GAD 7 : Generalized Anxiety Score 06/07/2018 04/23/2018 03/29/2018 03/15/2018  Nervous, Anxious, on Edge 3 2 3 2   Control/stop worrying 2 3 3 2   Worry too much - different things 3 3 3 2   Trouble relaxing 3 2 2 1   Restless 1 0 0 0  Easily annoyed or irritable 0 2 1 1   Afraid - awful might happen 3 3 2 2   Total GAD 7 Score 15 15 14  10

## 2018-06-07 NOTE — Progress Notes (Signed)
Baby scripts gluco log:

## 2018-06-07 NOTE — Progress Notes (Signed)
Subjective:  Christina Morton is a 33 y.o. G2P1001 at [redacted]w[redacted]d being seen today for ongoing prenatal care.  She is currently monitored for the following issues for this high-risk pregnancy and has Supervision of high risk pregnancy, antepartum; History of cesarean section, classical; Urachal cyst; Family history of heart disease; Diabetes mellitus complicating pregnancy, antepartum; Depression; Glaucoma; Weight loss; and Obesity in pregnancy on their problem list.  GDM: Patient taking metformin and insulin.  Reports 2 hypoglycemic episodes.  Tolerating medication well Fasting: mostly controlled 2hr PP: 3 elevated. Didn't check 5 times.  Patient reports no complaints.  Contractions: Irritability. Vag. Bleeding: None.  Movement: Present. Denies leaking of fluid.   The following portions of the patient's history were reviewed and updated as appropriate: allergies, current medications, past family history, past medical history, past social history, past surgical history and problem list. Problem list updated.  Objective:   Vitals:   06/07/18 1320  BP: 112/77  Pulse: (!) 111  Weight: 248 lb 14.4 oz (112.9 kg)    Fetal Status: Fetal Heart Rate (bpm): 154 Fundal Height: 32 cm Movement: Present     General:  Alert, oriented and cooperative. Patient is in no acute distress.  Skin: Skin is warm and dry. No rash noted.   Cardiovascular: Normal heart rate noted  Respiratory: Normal respiratory effort, no problems with respiration noted  Abdomen: Soft, gravid, appropriate for gestational age. Pain/Pressure: Present     Pelvic: Vag. Bleeding: None     Cervical exam deferred        Extremities: Normal range of motion.  Edema: None  Mental Status: Normal mood and affect. Normal behavior. Normal judgment and thought content.   Urinalysis:      Assessment and Plan:  Pregnancy: G2P1001 at [redacted]w[redacted]d  1. Supervision of high risk pregnancy, antepartum FHT normal  2. Diabetes mellitus complicating pregnancy,  antepartum Continue current medication regimen BPP and growth in 2 weeks  3. History of cesarean section, classical Delivery at 36-37 weeks (already scheduled 4/1)  4. Obesity in pregnancy   Preterm labor symptoms and general obstetric precautions including but not limited to vaginal bleeding, contractions, leaking of fluid and fetal movement were reviewed in detail with the patient. Please refer to After Visit Summary for other counseling recommendations.  Return in about 2 weeks (around 06/21/2018).   Levie Heritage, DO

## 2018-06-18 ENCOUNTER — Ambulatory Visit (HOSPITAL_COMMUNITY): Payer: Medicaid Other | Admitting: *Deleted

## 2018-06-18 ENCOUNTER — Ambulatory Visit (HOSPITAL_COMMUNITY)
Admission: RE | Admit: 2018-06-18 | Discharge: 2018-06-18 | Disposition: A | Discharge status: Home / Self Care | Payer: Medicaid Other | Source: Ambulatory Visit | Attending: Family Medicine | Admitting: Family Medicine

## 2018-06-18 ENCOUNTER — Encounter (HOSPITAL_COMMUNITY): Payer: Self-pay | Admitting: *Deleted

## 2018-06-18 DIAGNOSIS — Z794 Long term (current) use of insulin: Secondary | ICD-10-CM | POA: Diagnosis present

## 2018-06-18 DIAGNOSIS — Z3A32 32 weeks gestation of pregnancy: Secondary | ICD-10-CM | POA: Diagnosis not present

## 2018-06-18 DIAGNOSIS — O24119 Pre-existing diabetes mellitus, type 2, in pregnancy, unspecified trimester: Secondary | ICD-10-CM | POA: Diagnosis present

## 2018-06-18 DIAGNOSIS — IMO0001 Reserved for inherently not codable concepts without codable children: Secondary | ICD-10-CM

## 2018-06-18 DIAGNOSIS — O34219 Maternal care for unspecified type scar from previous cesarean delivery: Secondary | ICD-10-CM

## 2018-06-18 DIAGNOSIS — O24113 Pre-existing diabetes mellitus, type 2, in pregnancy, third trimester: Secondary | ICD-10-CM | POA: Diagnosis not present

## 2018-06-18 DIAGNOSIS — O24313 Unspecified pre-existing diabetes mellitus in pregnancy, third trimester: Secondary | ICD-10-CM | POA: Insufficient documentation

## 2018-06-18 DIAGNOSIS — O99210 Obesity complicating pregnancy, unspecified trimester: Secondary | ICD-10-CM | POA: Diagnosis present

## 2018-06-18 NOTE — Procedures (Signed)
Christina Morton [birth year] [redacted]w[redacted]d  Fetus A Non-Stress Test Interpretation for 06/18/18  Indication: Unsatisfactory BPP  Fetal Heart Rate A Mode: External Baseline Rate (A): 140 bpm Variability: Moderate Accelerations: 15 x 15 Decelerations: None Multiple birth?: No  Uterine Activity Mode: Toco Contraction Frequency (min): none noted  Interpretation (Fetal Testing) Nonstress Test Interpretation: Reactive Comments: FHR tracing rev'd by Dr. Grace Bushy

## 2018-06-21 ENCOUNTER — Encounter: Payer: Self-pay | Admitting: Family Medicine

## 2018-06-21 ENCOUNTER — Other Ambulatory Visit (HOSPITAL_COMMUNITY): Payer: Self-pay | Admitting: *Deleted

## 2018-06-21 ENCOUNTER — Ambulatory Visit (INDEPENDENT_AMBULATORY_CARE_PROVIDER_SITE_OTHER): Payer: Medicaid Other | Admitting: Family Medicine

## 2018-06-21 ENCOUNTER — Ambulatory Visit (INDEPENDENT_AMBULATORY_CARE_PROVIDER_SITE_OTHER): Payer: Medicaid Other | Admitting: Clinical

## 2018-06-21 ENCOUNTER — Telehealth: Payer: Self-pay | Admitting: Family Medicine

## 2018-06-21 VITALS — BP 105/71 | HR 106 | Wt 253.0 lb

## 2018-06-21 DIAGNOSIS — F424 Excoriation (skin-picking) disorder: Secondary | ICD-10-CM

## 2018-06-21 DIAGNOSIS — O24912 Unspecified diabetes mellitus in pregnancy, second trimester: Secondary | ICD-10-CM

## 2018-06-21 DIAGNOSIS — Z98891 History of uterine scar from previous surgery: Secondary | ICD-10-CM

## 2018-06-21 DIAGNOSIS — O24313 Unspecified pre-existing diabetes mellitus in pregnancy, third trimester: Principal | ICD-10-CM

## 2018-06-21 DIAGNOSIS — O99212 Obesity complicating pregnancy, second trimester: Secondary | ICD-10-CM

## 2018-06-21 DIAGNOSIS — IMO0001 Reserved for inherently not codable concepts without codable children: Secondary | ICD-10-CM

## 2018-06-21 DIAGNOSIS — O24919 Unspecified diabetes mellitus in pregnancy, unspecified trimester: Secondary | ICD-10-CM

## 2018-06-21 DIAGNOSIS — O0990 Supervision of high risk pregnancy, unspecified, unspecified trimester: Secondary | ICD-10-CM

## 2018-06-21 DIAGNOSIS — Z794 Long term (current) use of insulin: Principal | ICD-10-CM

## 2018-06-21 DIAGNOSIS — O0992 Supervision of high risk pregnancy, unspecified, second trimester: Secondary | ICD-10-CM

## 2018-06-21 DIAGNOSIS — Z3A32 32 weeks gestation of pregnancy: Secondary | ICD-10-CM

## 2018-06-21 DIAGNOSIS — O99210 Obesity complicating pregnancy, unspecified trimester: Secondary | ICD-10-CM

## 2018-06-21 LAB — POCT URINALYSIS DIP (DEVICE)
Bilirubin Urine: NEGATIVE
Glucose, UA: 100 mg/dL — AB
Hgb urine dipstick: NEGATIVE
NITRITE: NEGATIVE
Protein, ur: 30 mg/dL — AB
Specific Gravity, Urine: 1.025 (ref 1.005–1.030)
Urobilinogen, UA: 2 mg/dL — ABNORMAL HIGH (ref 0.0–1.0)
pH: 6 (ref 5.0–8.0)

## 2018-06-21 NOTE — Progress Notes (Signed)
Subjective:  Christina Morton is a 33 y.o. G2P1001 at [redacted]w[redacted]d being seen today for ongoing prenatal care.  She is currently monitored for the following issues for this high-risk pregnancy and has Supervision of high risk pregnancy, antepartum; History of cesarean section, classical; Urachal cyst; Family history of heart disease; Diabetes mellitus complicating pregnancy, antepartum; Depression; Glaucoma; Weight loss; and Obesity in pregnancy on their problem list.  GDM: Patient taking NPH and Aspart.  Reports no hypoglycemic episodes.  Tolerating medication well Fasting: 1 elevated fasting 2hr PP: 5 elevated PP  Patient reports no complaints.  Contractions: Irritability. Vag. Bleeding: None.  Movement: Present. Denies leaking of fluid.   The following portions of the patient's history were reviewed and updated as appropriate: allergies, current medications, past family history, past medical history, past social history, past surgical history and problem list. Problem list updated.  Objective:   Vitals:   06/21/18 1008  BP: 105/71  Pulse: (!) 106  Weight: 253 lb (114.8 kg)    Fetal Status: Fetal Heart Rate (bpm): 156   Movement: Present     General:  Alert, oriented and cooperative. Patient is in no acute distress.  Skin: Skin is warm and dry. No rash noted.   Cardiovascular: Normal heart rate noted  Respiratory: Normal respiratory effort, no problems with respiration noted  Abdomen: Soft, gravid, appropriate for gestational age. Pain/Pressure: Present     Pelvic: Vag. Bleeding: None     Cervical exam deferred        Extremities: Normal range of motion.  Edema: None  Mental Status: Normal mood and affect. Normal behavior. Normal judgment and thought content.   Urinalysis:      Assessment and Plan:  Pregnancy: G2P1001 at [redacted]w[redacted]d  1. Supervision of high risk pregnancy, antepartum FHT and FH normal  2. History of cesarean section, classical Rpt c/s scheduled for 4/1.  3. Diabetes  mellitus complicating pregnancy, antepartum Controlled Next [place] Friday Last BPP 8/10  4. Obesity in pregnancy   Preterm labor symptoms and general obstetric precautions including but not limited to vaginal bleeding, contractions, leaking of fluid and fetal movement were reviewed in detail with the patient. Please refer to After Visit Summary for other counseling recommendations.  Return in about 2 weeks (around 07/05/2018).   Levie Heritage, DO

## 2018-06-21 NOTE — BH Specialist Note (Signed)
Integrated Behavioral Health Follow Up Visit  MRN: [id number] Name: Christina Morton  Number of Integrated Behavioral Health Clinician visits: 6/6 Session Start time: 10:54  Session End time: 11:39 Total time: 40 minutes  Type of Service: Integrated Behavioral Health- Individual/Family Interpretor:No. Interpretor Name and Language: n/a  SUBJECTIVE: Christina Morton is a 33 y.o. female accompanied by n/a Patient was referred by Candelaria Celeste, DO for history of depression at a previous visit Patient reports the following symptoms/concerns: Pt states that taking melatonin every night (2.5 mg only), has helped to greatly improve her sleep; picking has not increased, and feels manageable at this time. Pt is feeling stress over her daily schedule, but has a plan in place to allow others to take over her schedule postpartum.  Duration of problem: Current pregnancy; Severity of problem: moderate  OBJECTIVE: Mood: Normal and Affect: Appropriate Risk of harm to self or others: No plan to harm self or others  LIFE CONTEXT: Family and Social: Pt lives with her fiance and 7yo son School/Work: Fiance working at [employer] Self-Care: Setting appropriate boundaries in most personal relationships (difficulty setting with son) Life Changes: Current pregnancy, loss of family members in past 5 years (most recent loss of 19yo brother in [place]) before move to [place]. Sister moved back to [place] after miscarriage.   GOALS ADDRESSED: Patient will: 1.  Reduce symptoms of: anxiety, depression, insomnia, obsessions and stress  2.  Increase knowledge and/or ability of: healthy habits  3.  Demonstrate ability to: Increase motivation to adhere to plan of care  INTERVENTIONS: Interventions utilized:  Motivational Interviewing Standardized Assessments completed: GAD-7 and PHQ 9  ASSESSMENT: Patient currently experiencing Excoriation (skin picking) disorder  Patient may benefit from continued brief therapeutic  intervention.  PLAN: 1. Follow up with behavioral health clinician on : As needed 2. Behavioral recommendations:  -Continue taking melatonin (2.5mg ) nightly for as long as remains beneficial for sleep -Continue to set healthy boundaries with family; advocate for your own healthy well-being -Consider establishing care for ongoing therapy postpartum, as needed 3. Referral(s): Integrated [facility] (In Clinic) 4. "From scale of 1-10, how likely are you to follow plan?": -  Rae Lips, LCSW

## 2018-06-21 NOTE — Telephone Encounter (Signed)
Called to change appointment however only wants to see Dr. Adrian Blackwater. Will come in today but may be late.

## 2018-06-25 ENCOUNTER — Ambulatory Visit (HOSPITAL_COMMUNITY)
Admission: RE | Admit: 2018-06-25 | Discharge: 2018-06-25 | Disposition: A | Discharge status: Home / Self Care | Payer: Medicaid Other | Source: Ambulatory Visit | Attending: Obstetrics and Gynecology | Admitting: Obstetrics and Gynecology

## 2018-06-25 ENCOUNTER — Ambulatory Visit (HOSPITAL_COMMUNITY): Payer: Medicaid Other

## 2018-06-25 ENCOUNTER — Encounter (HOSPITAL_COMMUNITY): Payer: Self-pay

## 2018-06-25 ENCOUNTER — Ambulatory Visit (HOSPITAL_COMMUNITY): Payer: Medicaid Other | Admitting: *Deleted

## 2018-06-25 VITALS — BP 103/58 | HR 92 | Wt 252.2 lb

## 2018-06-25 DIAGNOSIS — O99210 Obesity complicating pregnancy, unspecified trimester: Secondary | ICD-10-CM

## 2018-06-25 DIAGNOSIS — O99213 Obesity complicating pregnancy, third trimester: Secondary | ICD-10-CM

## 2018-06-25 DIAGNOSIS — Z3A33 33 weeks gestation of pregnancy: Secondary | ICD-10-CM

## 2018-06-25 DIAGNOSIS — Z794 Long term (current) use of insulin: Secondary | ICD-10-CM | POA: Diagnosis present

## 2018-06-25 DIAGNOSIS — O24113 Pre-existing diabetes mellitus, type 2, in pregnancy, third trimester: Secondary | ICD-10-CM | POA: Diagnosis not present

## 2018-06-25 DIAGNOSIS — O24313 Unspecified pre-existing diabetes mellitus in pregnancy, third trimester: Secondary | ICD-10-CM | POA: Insufficient documentation

## 2018-06-25 DIAGNOSIS — O34219 Maternal care for unspecified type scar from previous cesarean delivery: Secondary | ICD-10-CM

## 2018-06-25 DIAGNOSIS — O24119 Pre-existing diabetes mellitus, type 2, in pregnancy, unspecified trimester: Secondary | ICD-10-CM | POA: Diagnosis present

## 2018-06-25 DIAGNOSIS — IMO0001 Reserved for inherently not codable concepts without codable children: Secondary | ICD-10-CM

## 2018-06-29 ENCOUNTER — Encounter: Payer: Self-pay | Admitting: Family Medicine

## 2018-07-01 ENCOUNTER — Encounter (HOSPITAL_COMMUNITY): Payer: Self-pay | Admitting: *Deleted

## 2018-07-01 ENCOUNTER — Other Ambulatory Visit: Payer: Self-pay

## 2018-07-01 ENCOUNTER — Inpatient Hospital Stay (HOSPITAL_COMMUNITY)
Admission: AD | Admit: 2018-07-01 | Discharge: 2018-07-01 | Disposition: A | Discharge status: Home / Self Care | Payer: Medicaid Other | Attending: Obstetrics and Gynecology | Admitting: Obstetrics and Gynecology

## 2018-07-01 DIAGNOSIS — O2343 Unspecified infection of urinary tract in pregnancy, third trimester: Secondary | ICD-10-CM

## 2018-07-01 DIAGNOSIS — O99213 Obesity complicating pregnancy, third trimester: Secondary | ICD-10-CM | POA: Diagnosis not present

## 2018-07-01 DIAGNOSIS — Z7982 Long term (current) use of aspirin: Secondary | ICD-10-CM | POA: Insufficient documentation

## 2018-07-01 DIAGNOSIS — R300 Dysuria: Secondary | ICD-10-CM | POA: Diagnosis present

## 2018-07-01 DIAGNOSIS — Z880 Allergy status to penicillin: Secondary | ICD-10-CM | POA: Insufficient documentation

## 2018-07-01 DIAGNOSIS — Z3A34 34 weeks gestation of pregnancy: Secondary | ICD-10-CM

## 2018-07-01 DIAGNOSIS — O99210 Obesity complicating pregnancy, unspecified trimester: Secondary | ICD-10-CM

## 2018-07-01 DIAGNOSIS — Z87891 Personal history of nicotine dependence: Secondary | ICD-10-CM | POA: Diagnosis not present

## 2018-07-01 DIAGNOSIS — E669 Obesity, unspecified: Secondary | ICD-10-CM | POA: Insufficient documentation

## 2018-07-01 LAB — URINALYSIS, ROUTINE W REFLEX MICROSCOPIC
Bilirubin Urine: NEGATIVE
Glucose, UA: NEGATIVE mg/dL
Ketones, ur: NEGATIVE mg/dL
Nitrite: NEGATIVE
PROTEIN: 100 mg/dL — AB
Specific Gravity, Urine: 1.018 (ref 1.005–1.030)
WBC, UA: >50 WBC/hpf — ABNORMAL HIGH (ref 0–5)
pH: 6 (ref 5.0–8.0)

## 2018-07-01 MED ORDER — NITROFURANTOIN MONOHYD MACRO 100 MG PO CAPS
100.0000 mg | ORAL_CAPSULE | Freq: Once | ORAL | Status: AC
  Administered 2018-07-01: 100 mg via ORAL
  Filled 2018-07-01: qty 1

## 2018-07-01 MED ORDER — NITROFURANTOIN MONOHYD MACRO 100 MG PO CAPS: 100.0000 mg | ORAL_CAPSULE | Freq: Two times a day (BID) | ORAL | 0 refills | Status: AC

## 2018-07-01 MED ORDER — PHENAZOPYRIDINE HCL 100 MG PO TABS
200.0000 mg | ORAL_TABLET | Freq: Once | ORAL | Status: AC
  Administered 2018-07-01: 200 mg via ORAL
  Filled 2018-07-01: qty 2

## 2018-07-01 NOTE — Discharge Instructions (Signed)
Pregnancy and Urinary Tract Infection  What is a urinary tract infection?    A urinary tract infection (UTI) is an infection of any part of the urinary tract. This includes the kidneys, the tubes that connect your kidneys to your bladder (ureters), the bladder, and the tube that carries urine out of your body (urethra). These organs make, store, and get rid of urine in the body.   An upper UTI affects the ureters and kidneys (pyelonephritis), and a lower UTI affects the bladder (cystitis) and urethra (urethritis). Most urinary tract infections are caused by bacteria in your genital area, around the entrance to your urinary tract (urethra). These bacteria grow and cause irritation and inflammation of your urinary tract.  Why am I more likely to get a UTI during pregnancy?  You are more likely to develop a UTI during pregnancy because:  · The physical and hormonal changes your body goes through can make it easier for bacteria to get into your urinary tract.  · Your growing baby puts pressure on your uterus and can affect urine flow.  Does a UTI place my baby at risk?  An untreated UTI during pregnancy could lead to a kidney infection, which can cause health problems that could affect your baby. Possible complications of an untreated UTI include:  · Having your baby before 37 weeks of pregnancy (premature).  · Having a baby with a low birth weight.  · Developing high blood pressure during pregnancy (preeclampsia).  · Having a low hemoglobin level (anemia).  What are the symptoms of a UTI?  Symptoms of a UTI include:  · Needing to urinate right away (urgently).  · Frequent urination or passing small amounts of urine frequently.  · Pain or burning with urination.  · Blood in the urine.  · Urine that smells bad or unusual.  · Trouble urinating.  · Cloudy urine.  · Pain in the abdomen or lower back.  · Vaginal discharge.  You may also have:  · Vomiting or a decreased appetite.  · Confusion.  · Irritability or  tiredness.  · A fever.  · Diarrhea.  What are the treatment options for a UTI during pregnancy?  Treatment for this condition may include:  · Antibiotic medicines that are safe to take during pregnancy.  · Other medicines to treat less common causes of UTI.  How can I prevent a UTI?  To prevent a UTI:  · Go to the bathroom as soon as you feel the need. Do not hold urine for long periods of time.  · Always wipe from front to back after a bowel movement. Use each tissue one time when you wipe.  · Empty your bladder after sex.  · Keep your genital area dry.  · Drink 6-10 glasses of water each day.  · Do not douche or use deodorant sprays.  Contact a health care provider if:  · Your symptoms do not improve or they get worse.  · You have abnormal vaginal discharge.  Get help right away if:  · You have a fever.  · You have nausea and vomiting.  · You have back or side pain.  · You feel contractions in your uterus.  · You have lower belly pain.  · You have a gush of fluid from your vagina.  · You have blood in your urine.  Summary  · A urinary tract infection (UTI) is an infection of any part of the urinary tract, which includes the kidneys, ureters,   bladder, and urethra.  · Most urinary tract infections are caused by bacteria in your genital area, around the entrance to your urinary tract (urethra).  · You are more likely to develop a UTI during pregnancy.  · If you were prescribed an antibiotic, take it as told by your health care provider. Do not stop taking the antibiotic even if you start to feel better.  This information is not intended to replace advice given to you by your health care provider. Make sure you discuss any questions you have with your health care provider.  Document Released: 08/02/2010 Document Revised: 06/02/2017 Document Reviewed: 02/26/2015  Elsevier Interactive Patient Education © 2019 Elsevier Inc.

## 2018-07-01 NOTE — MAU Note (Signed)
Presents with c/o lower back pain, urinary frequency, and dysuria.  Denies hematuria. Denies LOF or Vb.  Reports +FM.

## 2018-07-01 NOTE — MAU Provider Note (Addendum)
History     CSN: [id number]  Arrival date and time: 07/01/18 0704   None     Chief Complaint  Patient presents with  . Back Pain  . Dysuria   [name] is a 33 y.o. G2P1001 at [redacted]w[redacted]d who presents to MAU for dysuria. Pt states she has had UTIs in the past and these symptoms feel the same.  Onset: 0500 today Location: suprapubic discomfort Duration: constant and worsening Character: "tingling" with urination, frequency Aggravating/Associated: none, pt denies fever, chills, back pain Relieving: none Treatment/Timing: pt has not attempted at home treatment  Pt denies VB, LOF, ctx, decreased FM, vaginal discharge/odor/itching. Pt denies N/V, abdominal pain, constipation, diarrhea. Pt denies kidney issues with pre-existing Type II DM.  Allergies? PCN - anaphylactic Prenatal care provider? [facility], next appt scheduled 07/09/2018    OB History    Gravida  2   Para  1   Term  1   Preterm  0   AB  0   Living  1     SAB  0   TAB  0   Ectopic  0   Multiple  0   Live Births  1           Past Medical History:  Diagnosis Date  . Anemia   . Depression   . Glaucoma   . Mental disorder   . Type 2 diabetes mellitus without complications [facility])     Past Surgical History:  Procedure Laterality Date  . CESAREAN SECTION    . CYSTECTOMY      Family History  Problem Relation Age of Onset  . Hypertrophic cardiomyopathy Mother   . Bradycardia Mother     Social History   Tobacco Use  . Smoking status: Former Smoker    Packs/day: 1.00    Years: 1.00    Pack years: 1.00    Types: Cigarettes    Last attempt to quit: 11/22/2017    Years since quitting: 0.6  . Smokeless tobacco: Never Used  Substance Use Topics  . Alcohol use: Never    Frequency: Never  . Drug use: Never    Allergies:  Allergies  Allergen Reactions  . Amoxicillin Anaphylaxis  . Penicillins Anaphylaxis    Medications Prior to Admission  Medication Sig Dispense Refill Last  Dose  . ACCU-CHEK FASTCLIX LANCETS MISC 1 Device by Percutaneous route 4 (four) times daily. 100 each 12 Taking  . aspirin EC 81 MG tablet Take 1 tablet (81 mg total) by mouth daily. Take after 12 weeks for prevention of preeclampsia later in pregnancy 300 tablet 2 Taking  . cyclobenzaprine (FLEXERIL) 5 MG tablet Take 1 tablet (5 mg total) by mouth 3 (three) times daily as needed for muscle spasms. (Patient not taking: Reported on 06/21/2018) 30 tablet 0 Not Taking  . ferrous sulfate 325 (65 FE) MG EC tablet Take 325 mg by mouth every other day.   Taking  . glucose blood (ACCU-CHEK GUIDE) test strip Use as instructed QID 100 each 12 Taking  . insulin aspart (NOVOLOG) 100 UNIT/ML injection Inject 5 Units into the skin daily before supper. 10 mL 12 Taking  . insulin NPH Human (HUMULIN N,NOVOLIN N) 100 UNIT/ML injection Inject 0.1 mLs (10 Units total) into the skin at bedtime. (Patient taking differently: Inject 10-15 Units into the skin 2 (two) times daily. 10 units Before breakfast and 15 units at bedtime) 10 mL 3 Taking  . Insulin Syringe-Needle U-100 (INSULIN SYRINGE .5CC/31GX5/16") 31G X 5/16"  0.5 ML MISC Use as directed 100 each 6 Taking  . Insulin Syringe-Needle U-100 31G X 5/16" 0.5 ML MISC Inject 1 Syringe into the skin 2 (two) times daily. 100 each 12 Taking  . Melatonin 2.5 MG CHEW Chew by mouth.   Taking  . metFORMIN (GLUCOPHAGE) 1000 MG tablet Take 1 tablet (1,000 mg total) by mouth 2 (two) times daily with a meal. 60 tablet 5 Taking  . Prenatal Multivit-Min-Fe-FA (PRENATAL VITAMINS) 0.8 MG tablet Take 1 tablet by mouth daily. 30 tablet 12 Taking    Review of Systems  Constitutional: Negative for chills, diaphoresis, fatigue and fever.  Gastrointestinal: Negative for abdominal pain, constipation, diarrhea, nausea and vomiting.  Genitourinary: Positive for decreased urine volume, dysuria, frequency, pelvic pain (suprapubic) and urgency. Negative for flank pain, hematuria, vaginal bleeding  and vaginal discharge.  Neurological: Negative for dizziness and light-headedness.   Physical Exam   Blood pressure 106/66, pulse 86, temperature 98 F (36.7 C), temperature source Oral, resp. rate 18, height 5\' 7"  (1.702 m), weight 113.5 kg, last menstrual period 11/04/2017, SpO2 100 %.  Physical Exam  Nursing note and vitals reviewed. Constitutional: She is oriented to person, place, and time. She appears well-developed and well-nourished. No distress.  HENT:  Head: Normocephalic.  Respiratory: Effort normal.  GI: Soft. She exhibits no distension and no mass. There is abdominal tenderness (suprapubic, mild). There is no rebound, no guarding and no CVA tenderness.  Genitourinary:    Uterus normal.   Neurological: She is alert and oriented to person, place, and time.  Skin: Skin is warm and dry. She is not diaphoretic.  Psychiatric: She has a normal mood and affect. Her behavior is normal.   Results for orders placed or performed during the hospital encounter of 07/01/18 (from the past 24 hour(s))  Urinalysis, Routine w reflex microscopic     Status: Abnormal   Collection Time: 07/01/18  7:37 AM  Result Value Ref Range   Color, Urine YELLOW YELLOW   APPearance CLOUDY (A) CLEAR   Specific Gravity, Urine 1.018 1.005 - 1.030   pH 6.0 5.0 - 8.0   Glucose, UA NEGATIVE NEGATIVE mg/dL   Hgb urine dipstick SMALL (A) NEGATIVE   Bilirubin Urine NEGATIVE NEGATIVE   Ketones, ur NEGATIVE NEGATIVE mg/dL   Protein, ur 774 (A) NEGATIVE mg/dL   Nitrite NEGATIVE NEGATIVE   Leukocytes,Ua LARGE (A) NEGATIVE   RBC / HPF 6-10 0 - 5 RBC/hpf   WBC, UA >50 (H) 0 - 5 WBC/hpf   Bacteria, UA FEW (A) NONE SEEN   Squamous Epithelial / LPF 6-10 0 - 5   Non Squamous Epithelial 0-5 (A) NONE SEEN    MAU Course  Procedures  MDM -pt presents with classic UTI sx with UA suspicious for UTI -first dose Macrobid and pyridium given in hospital -OB urine culture sent -pt discharged to home  ADDENDUM EFM  baseline 140, mod variability, pos accels, neg decels. TOCO minimal irregular ctx.  Vishruth Seoane, Odie Sera, NP  12:40 PM 07/01/2018     Orders Placed This Encounter  Procedures  . Culture, OB Urine    Standing Status:   Standing    Number of Occurrences:   1  . Urinalysis, Routine w reflex microscopic    Standing Status:   Standing    Number of Occurrences:   1  . Discharge patient    Order Specific Question:   Discharge disposition    Answer:   01-Home or Self Care [1]  Order Specific Question:   Discharge patient date    Answer:   07/01/2018   Meds ordered this encounter  Medications  . nitrofurantoin (macrocrystal-monohydrate) (MACROBID) capsule 100 mg  . phenazopyridine (PYRIDIUM) tablet 200 mg  . nitrofurantoin, macrocrystal-monohydrate, (MACROBID) 100 MG capsule    Sig: Take 1 capsule (100 mg total) by mouth 2 (two) times daily for 7 days.    Dispense:  14 capsule    Refill:  0    Order Specific Question:   Supervising Provider    Answer:   CONSTANT, PEGGY [[id number]]     Assessment and Plan   1. UTI in pregnancy, antepartum, third trimester   2. Obesity in pregnancy   3. [redacted] weeks gestation of pregnancy    Allergies as of 07/01/2018      Reactions   Amoxicillin Anaphylaxis   Penicillins Anaphylaxis      Medication List    STOP taking these medications   cyclobenzaprine 5 MG tablet Commonly known as:  FLEXERIL     TAKE these medications   Accu-Chek FastClix Lancets Misc 1 Device by Percutaneous route 4 (four) times daily.   aspirin EC 81 MG tablet Take 1 tablet (81 mg total) by mouth daily. Take after 12 weeks for prevention of preeclampsia later in pregnancy   ferrous sulfate 325 (65 FE) MG EC tablet Take 325 mg by mouth every other day.   glucose blood test strip Commonly known as:  Accu-Chek Guide Use as instructed QID   insulin aspart 100 UNIT/ML injection Commonly known as:  novoLOG Inject 5 Units into the skin daily before supper.   insulin NPH  Human 100 UNIT/ML injection Commonly known as:  HUMULIN N,NOVOLIN N Inject 0.1 mLs (10 Units total) into the skin at bedtime. What changed:    how much to take  when to take this  additional instructions   INSULIN SYRINGE .5CC/31GX5/16" 31G X 5/16" 0.5 ML Misc Use as directed   Insulin Syringe-Needle U-100 31G X 5/16" 0.5 ML Misc Inject 1 Syringe into the skin 2 (two) times daily.   Melatonin 2.5 MG Chew Chew by mouth.   metFORMIN 1000 MG tablet Commonly known as:  GLUCOPHAGE Take 1 tablet (1,000 mg total) by mouth 2 (two) times daily with a meal.   nitrofurantoin (macrocrystal-monohydrate) 100 MG capsule Commonly known as:  Macrobid Take 1 capsule (100 mg total) by mouth 2 (two) times daily for 7 days.   Prenatal Vitamins 0.8 MG tablet Take 1 tablet by mouth daily.      -pyelonephritis/MAU return precautions given -discussed importance of completing ABX course as prescribed -pt desires to purchase AZO OTC, advised not to use for longer than 2days -discharged to home in stable condition  Joni Reining E Alexcia [employer] 07/01/2018, 10:18 AM

## 2018-07-02 ENCOUNTER — Ambulatory Visit (HOSPITAL_COMMUNITY)
Admission: RE | Admit: 2018-07-02 | Discharge: 2018-07-02 | Disposition: A | Discharge status: Home / Self Care | Payer: Medicaid Other | Source: Ambulatory Visit | Attending: Obstetrics and Gynecology | Admitting: Obstetrics and Gynecology

## 2018-07-02 ENCOUNTER — Encounter (HOSPITAL_COMMUNITY): Payer: Self-pay

## 2018-07-02 ENCOUNTER — Other Ambulatory Visit (HOSPITAL_COMMUNITY): Payer: Self-pay | Admitting: Maternal & Fetal Medicine

## 2018-07-02 ENCOUNTER — Ambulatory Visit (HOSPITAL_COMMUNITY): Payer: Medicaid Other | Admitting: *Deleted

## 2018-07-02 ENCOUNTER — Ambulatory Visit (HOSPITAL_BASED_OUTPATIENT_CLINIC_OR_DEPARTMENT_OTHER)
Admission: RE | Admit: 2018-07-02 | Discharge: 2018-07-02 | Disposition: A | Discharge status: Home / Self Care | Payer: Medicaid Other | Source: Ambulatory Visit | Attending: Maternal & Fetal Medicine | Admitting: Maternal & Fetal Medicine

## 2018-07-02 VITALS — BP 121/68 | HR 97 | Wt 247.4 lb

## 2018-07-02 DIAGNOSIS — O24313 Unspecified pre-existing diabetes mellitus in pregnancy, third trimester: Secondary | ICD-10-CM | POA: Insufficient documentation

## 2018-07-02 DIAGNOSIS — O24119 Pre-existing diabetes mellitus, type 2, in pregnancy, unspecified trimester: Secondary | ICD-10-CM | POA: Diagnosis present

## 2018-07-02 DIAGNOSIS — O34219 Maternal care for unspecified type scar from previous cesarean delivery: Secondary | ICD-10-CM | POA: Diagnosis not present

## 2018-07-02 DIAGNOSIS — O24913 Unspecified diabetes mellitus in pregnancy, third trimester: Secondary | ICD-10-CM | POA: Insufficient documentation

## 2018-07-02 DIAGNOSIS — Z3A34 34 weeks gestation of pregnancy: Secondary | ICD-10-CM

## 2018-07-02 DIAGNOSIS — Z794 Long term (current) use of insulin: Secondary | ICD-10-CM | POA: Insufficient documentation

## 2018-07-02 DIAGNOSIS — O24113 Pre-existing diabetes mellitus, type 2, in pregnancy, third trimester: Secondary | ICD-10-CM

## 2018-07-02 DIAGNOSIS — O99213 Obesity complicating pregnancy, third trimester: Secondary | ICD-10-CM | POA: Diagnosis not present

## 2018-07-02 DIAGNOSIS — O99210 Obesity complicating pregnancy, unspecified trimester: Secondary | ICD-10-CM | POA: Diagnosis present

## 2018-07-02 DIAGNOSIS — IMO0001 Reserved for inherently not codable concepts without codable children: Secondary | ICD-10-CM

## 2018-07-02 NOTE — Procedures (Signed)
Christina Morton [birth year] [redacted]w[redacted]d  Fetus A Non-Stress Test Interpretation for 07/02/18  Indication: Diabetes  Fetal Heart Rate A Mode: External Baseline Rate (A): 140 bpm Variability: Moderate, Minimal Accelerations: 10 x 10 Decelerations: None Multiple birth?: No  Uterine Activity Mode: Palpation, Toco Contraction Frequency (min): None Resting Tone Palpated: Relaxed Resting Time: Adequate  Interpretation (Fetal Testing) Nonstress Test Interpretation: Non-reactive Comments: FHR tracing rev'd by Dr. Grace Bushy

## 2018-07-03 LAB — CULTURE, OB URINE: Culture: >=100000 — AB

## 2018-07-04 ENCOUNTER — Telehealth: Payer: Self-pay | Admitting: Certified Nurse Midwife

## 2018-07-04 MED ORDER — SULFAMETHOXAZOLE-TRIMETHOPRIM 800-160 MG PO TABS: 1.0000 | ORAL_TABLET | Freq: Two times a day (BID) | ORAL | 0 refills | Status: AC

## 2018-07-04 NOTE — Telephone Encounter (Signed)
Pt informed need to change abx for UTI. Was taking Macrobid but UC shows resistance. Rx sent for Bactrim.

## 2018-07-08 ENCOUNTER — Encounter (HOSPITAL_COMMUNITY): Payer: Self-pay

## 2018-07-09 ENCOUNTER — Ambulatory Visit (HOSPITAL_COMMUNITY): Payer: Medicaid Other | Admitting: *Deleted

## 2018-07-09 ENCOUNTER — Encounter (HOSPITAL_COMMUNITY): Payer: Self-pay

## 2018-07-09 ENCOUNTER — Ambulatory Visit (INDEPENDENT_AMBULATORY_CARE_PROVIDER_SITE_OTHER): Payer: Medicaid Other | Admitting: Family Medicine

## 2018-07-09 ENCOUNTER — Other Ambulatory Visit: Payer: Self-pay

## 2018-07-09 ENCOUNTER — Ambulatory Visit (HOSPITAL_COMMUNITY)
Admission: RE | Admit: 2018-07-09 | Discharge: 2018-07-09 | Disposition: A | Discharge status: Home / Self Care | Payer: Medicaid Other | Source: Ambulatory Visit | Attending: Obstetrics and Gynecology | Admitting: Obstetrics and Gynecology

## 2018-07-09 VITALS — BP 123/76 | HR 113 | Temp 98.0°F | Wt 246.1 lb

## 2018-07-09 VITALS — BP 127/76 | HR 102 | Wt 247.0 lb

## 2018-07-09 DIAGNOSIS — O24113 Pre-existing diabetes mellitus, type 2, in pregnancy, third trimester: Secondary | ICD-10-CM

## 2018-07-09 DIAGNOSIS — O34219 Maternal care for unspecified type scar from previous cesarean delivery: Secondary | ICD-10-CM

## 2018-07-09 DIAGNOSIS — IMO0001 Reserved for inherently not codable concepts without codable children: Secondary | ICD-10-CM

## 2018-07-09 DIAGNOSIS — Z98891 History of uterine scar from previous surgery: Secondary | ICD-10-CM

## 2018-07-09 DIAGNOSIS — O24919 Unspecified diabetes mellitus in pregnancy, unspecified trimester: Secondary | ICD-10-CM

## 2018-07-09 DIAGNOSIS — O24913 Unspecified diabetes mellitus in pregnancy, third trimester: Secondary | ICD-10-CM

## 2018-07-09 DIAGNOSIS — O24313 Unspecified pre-existing diabetes mellitus in pregnancy, third trimester: Secondary | ICD-10-CM | POA: Diagnosis present

## 2018-07-09 DIAGNOSIS — O24119 Pre-existing diabetes mellitus, type 2, in pregnancy, unspecified trimester: Secondary | ICD-10-CM

## 2018-07-09 DIAGNOSIS — O99210 Obesity complicating pregnancy, unspecified trimester: Secondary | ICD-10-CM

## 2018-07-09 DIAGNOSIS — Z3A35 35 weeks gestation of pregnancy: Secondary | ICD-10-CM

## 2018-07-09 DIAGNOSIS — O99213 Obesity complicating pregnancy, third trimester: Secondary | ICD-10-CM

## 2018-07-09 DIAGNOSIS — Z794 Long term (current) use of insulin: Principal | ICD-10-CM

## 2018-07-09 DIAGNOSIS — O0990 Supervision of high risk pregnancy, unspecified, unspecified trimester: Secondary | ICD-10-CM

## 2018-07-09 NOTE — Progress Notes (Signed)
   PRENATAL VISIT NOTE  Subjective:  Christina Morton is a 33 y.o. G2P1001 at [redacted]w[redacted]d being seen today for ongoing prenatal care.  She is currently monitored for the following issues for this high-risk pregnancy and has Supervision of high risk pregnancy, antepartum; History of cesarean section, classical; Urachal cyst; Family history of heart disease; Diabetes mellitus complicating pregnancy, antepartum; Depression; Glaucoma; Weight loss; and Obesity in pregnancy on their problem list.  Patient reports no complaints.  Contractions: Irritability. Vag. Bleeding: None.  Movement: Present. Denies leaking of fluid.   The following portions of the patient's history were reviewed and updated as appropriate: allergies, current medications, past family history, past medical history, past social history, past surgical history and problem list.   Objective:   Vitals:   07/09/18 1122  BP: 127/76  Pulse: (!) 102  Weight: 247 lb (112 kg)    Fetal Status: Fetal Heart Rate (bpm): 152   Movement: Present     General:  Alert, oriented and cooperative. Patient is in no acute distress.  Skin: Skin is warm and dry. No rash noted.   Cardiovascular: Normal heart rate noted  Respiratory: Normal respiratory effort, no problems with respiration noted  Abdomen: Soft, gravid, appropriate for gestational age.  Pain/Pressure: Present     Pelvic: Cervical exam deferred        Extremities: Normal range of motion.     Mental Status: Normal mood and affect. Normal behavior. Normal judgment and thought content.   Assessment and Plan:  Pregnancy: G2P1001 at [redacted]w[redacted]d 1. Diabetes mellitus complicating pregnancy, antepartum CBGs are good, see babyscripts log  2. Supervision of high risk pregnancy, antepartum   3. History of cesarean section, classical For RCS with BTL 4/1--in testing with MFM--does not need f/u with [place] if CBGs are normal next week.  Term labor symptoms and general obstetric precautions including but not  limited to vaginal bleeding, contractions, leaking of fluid and fetal movement were reviewed in detail with the patient. Please refer to After Visit Summary for other counseling recommendations.   Return in 6 weeks (on 08/20/2018) for pp check and IUD placement.  Future Appointments  Date Time Provider Department Center  07/09/2018  1:00 PM [facility] NURSE [facility]  07/09/2018  1:15 PM WH-MFC NST WH-MFC MFC-US  07/09/2018  1:45 PM WH-MFC [place] 2 WH-MFCUS MFC-US  07/16/2018  1:00 PM WH-MFC NURSE WH-MFC MFC-US  07/16/2018  1:15 PM WH-MFC NST WH-MFC MFC-US  07/16/2018  2:00 PM WH-MFC [place] 3 WH-MFCUS MFC-US  07/20/2018  8:30 AM MC-LD PAT 1 MC-INDC None  08/23/2018 12:30 PM Sherrie George, MD TRE-TRE None    Reva Bores, MD

## 2018-07-09 NOTE — Patient Instructions (Signed)

## 2018-07-09 NOTE — Procedures (Signed)
[name] 1985/12/13 [redacted]w[redacted]d  Fetus A Non-Stress Test Interpretation for 07/09/18  Indication: Diabetes  Fetal Heart Rate A Mode: External Baseline Rate (A): 155 bpm Variability: Moderate Accelerations: 15 x 15 Decelerations: None Multiple birth?: No  Uterine Activity Mode: Palpation, Toco Contraction Frequency (min): U/I Contraction Duration (sec): 20-30 Contraction Quality: Mild(pt denies feeling) Resting Tone Palpated: Relaxed Resting Time: Adequate  Interpretation (Fetal Testing) Nonstress Test Interpretation: Reactive Comments: FHR tracing rev'd by Dr. Judeth Cornfield

## 2018-07-16 ENCOUNTER — Ambulatory Visit (HOSPITAL_COMMUNITY)
Admission: RE | Admit: 2018-07-16 | Discharge: 2018-07-16 | Disposition: A | Discharge status: Home / Self Care | Payer: Medicaid Other | Source: Ambulatory Visit | Attending: Obstetrics and Gynecology | Admitting: Obstetrics and Gynecology

## 2018-07-16 ENCOUNTER — Encounter (HOSPITAL_COMMUNITY): Payer: Self-pay

## 2018-07-16 ENCOUNTER — Other Ambulatory Visit: Payer: Self-pay

## 2018-07-16 ENCOUNTER — Ambulatory Visit (HOSPITAL_COMMUNITY): Payer: Medicaid Other | Admitting: *Deleted

## 2018-07-16 VITALS — BP 114/74 | HR 113 | Temp 97.8°F

## 2018-07-16 DIAGNOSIS — Z362 Encounter for other antenatal screening follow-up: Secondary | ICD-10-CM

## 2018-07-16 DIAGNOSIS — O34219 Maternal care for unspecified type scar from previous cesarean delivery: Secondary | ICD-10-CM | POA: Diagnosis not present

## 2018-07-16 DIAGNOSIS — O24119 Pre-existing diabetes mellitus, type 2, in pregnancy, unspecified trimester: Secondary | ICD-10-CM | POA: Diagnosis present

## 2018-07-16 DIAGNOSIS — O24313 Unspecified pre-existing diabetes mellitus in pregnancy, third trimester: Secondary | ICD-10-CM | POA: Insufficient documentation

## 2018-07-16 DIAGNOSIS — O24113 Pre-existing diabetes mellitus, type 2, in pregnancy, third trimester: Secondary | ICD-10-CM

## 2018-07-16 DIAGNOSIS — O24414 Gestational diabetes mellitus in pregnancy, insulin controlled: Secondary | ICD-10-CM

## 2018-07-16 DIAGNOSIS — Z794 Long term (current) use of insulin: Secondary | ICD-10-CM

## 2018-07-16 DIAGNOSIS — O99210 Obesity complicating pregnancy, unspecified trimester: Secondary | ICD-10-CM | POA: Diagnosis present

## 2018-07-16 DIAGNOSIS — Z3A36 36 weeks gestation of pregnancy: Secondary | ICD-10-CM

## 2018-07-16 DIAGNOSIS — IMO0001 Reserved for inherently not codable concepts without codable children: Secondary | ICD-10-CM

## 2018-07-16 DIAGNOSIS — O99213 Obesity complicating pregnancy, third trimester: Secondary | ICD-10-CM | POA: Diagnosis not present

## 2018-07-16 NOTE — Patient Instructions (Signed)
Jala Vanacker  07/16/2018   Your procedure is scheduled on:  07/21/2018  Arrive at 0700 at Entrance C on [address] at [facility]  and [address]. You are invited to use the FREE valet parking or use the Visitor's parking deck.  Pick up the phone at the desk and dial [phone].  Call this number if you have problems the morning of surgery: [phone]  Remember:   Do not eat food:(After Midnight) Desps de medianoche.  Do not drink clear liquids: (After Midnight) Desps de medianoche.  Take these medicines the morning of surgery with A SIP OF WATER:  Do not take Metformin the day befor or the day of surgery.  Take 7units of NPH insulin the night before surgery.   Do not wear jewelry, make-up or nail polish.  Do not wear lotions, powders, or perfumes. Do not wear deodorant.  Do not shave 48 hours prior to surgery.  Do not bring valuables to the hospital.  [facility] is not   responsible for any belongings or valuables brought to the hospital.  Contacts, dentures or bridgework may not be worn into surgery.  Leave suitcase in the car. After surgery it may be brought to your room.  For patients admitted to the hospital, checkout time is 11:00 AM the day of              discharge.      Please read over the following fact sheets that you were given:     Preparing for Surgery

## 2018-07-16 NOTE — Procedures (Signed)
Christina Morton [birth year] [redacted]w[redacted]d  Fetus A Non-Stress Test Interpretation for 07/16/18  Indication: Diabetes  Fetal Heart Rate A Mode: External Baseline Rate (A): 145 bpm Variability: Moderate Accelerations: 15 x 15 Decelerations: None Multiple birth?: No  Uterine Activity Mode: Toco Contraction Frequency (min): none noted  Interpretation (Fetal Testing) Nonstress Test Interpretation: Reactive Comments: FHR tracing rev'd by Dr. Grace Bushy

## 2018-07-20 ENCOUNTER — Encounter (HOSPITAL_COMMUNITY)
Admission: RE | Admit: 2018-07-20 | Discharge: 2018-07-20 | Disposition: A | Discharge status: Home / Self Care | Payer: Medicaid Other | Source: Ambulatory Visit

## 2018-07-21 ENCOUNTER — Inpatient Hospital Stay (HOSPITAL_COMMUNITY)
Admission: RE | Admit: 2018-07-21 | Discharge: 2018-07-23 | DRG: 783 | Disposition: A | Discharge status: Home / Self Care | Payer: Medicaid Other | Attending: Obstetrics and Gynecology | Admitting: Obstetrics and Gynecology

## 2018-07-21 ENCOUNTER — Encounter (HOSPITAL_COMMUNITY)
Admission: RE | Disposition: A | Discharge status: Home / Self Care | Payer: Self-pay | Source: Home / Self Care | Attending: Obstetrics and Gynecology

## 2018-07-21 ENCOUNTER — Inpatient Hospital Stay (HOSPITAL_COMMUNITY): Payer: Medicaid Other | Admitting: Certified Registered"

## 2018-07-21 ENCOUNTER — Encounter (HOSPITAL_COMMUNITY): Payer: Self-pay | Admitting: *Deleted

## 2018-07-21 DIAGNOSIS — D649 Anemia, unspecified: Secondary | ICD-10-CM | POA: Diagnosis present

## 2018-07-21 DIAGNOSIS — F329 Major depressive disorder, single episode, unspecified: Secondary | ICD-10-CM | POA: Diagnosis present

## 2018-07-21 DIAGNOSIS — Z794 Long term (current) use of insulin: Secondary | ICD-10-CM

## 2018-07-21 DIAGNOSIS — O99344 Other mental disorders complicating childbirth: Secondary | ICD-10-CM | POA: Diagnosis present

## 2018-07-21 DIAGNOSIS — Z302 Encounter for sterilization: Secondary | ICD-10-CM

## 2018-07-21 DIAGNOSIS — Z880 Allergy status to penicillin: Secondary | ICD-10-CM

## 2018-07-21 DIAGNOSIS — O9902 Anemia complicating childbirth: Secondary | ICD-10-CM | POA: Diagnosis present

## 2018-07-21 DIAGNOSIS — Z3A37 37 weeks gestation of pregnancy: Secondary | ICD-10-CM

## 2018-07-21 DIAGNOSIS — E119 Type 2 diabetes mellitus without complications: Secondary | ICD-10-CM | POA: Diagnosis present

## 2018-07-21 DIAGNOSIS — F424 Excoriation (skin-picking) disorder: Secondary | ICD-10-CM | POA: Diagnosis present

## 2018-07-21 DIAGNOSIS — E1169 Type 2 diabetes mellitus with other specified complication: Secondary | ICD-10-CM | POA: Diagnosis present

## 2018-07-21 DIAGNOSIS — O3663X0 Maternal care for excessive fetal growth, third trimester, not applicable or unspecified: Secondary | ICD-10-CM | POA: Diagnosis present

## 2018-07-21 DIAGNOSIS — O34211 Maternal care for low transverse scar from previous cesarean delivery: Principal | ICD-10-CM | POA: Diagnosis present

## 2018-07-21 DIAGNOSIS — O34212 Maternal care for vertical scar from previous cesarean delivery: Secondary | ICD-10-CM

## 2018-07-21 DIAGNOSIS — O99210 Obesity complicating pregnancy, unspecified trimester: Secondary | ICD-10-CM

## 2018-07-21 DIAGNOSIS — F32A Depression, unspecified: Secondary | ICD-10-CM | POA: Diagnosis present

## 2018-07-21 DIAGNOSIS — Z87891 Personal history of nicotine dependence: Secondary | ICD-10-CM

## 2018-07-21 DIAGNOSIS — E669 Obesity, unspecified: Secondary | ICD-10-CM | POA: Diagnosis present

## 2018-07-21 DIAGNOSIS — O2412 Pre-existing diabetes mellitus, type 2, in childbirth: Secondary | ICD-10-CM | POA: Diagnosis present

## 2018-07-21 DIAGNOSIS — Z98891 History of uterine scar from previous surgery: Secondary | ICD-10-CM

## 2018-07-21 LAB — TYPE AND SCREEN
ABO/RH(D): AB POS
Antibody Screen: NEGATIVE

## 2018-07-21 LAB — CBC
HCT: 34.2 % — ABNORMAL LOW (ref 36.0–46.0)
HCT: 29.2 % — ABNORMAL LOW (ref 36.0–46.0)
Hemoglobin: 9.8 g/dL — ABNORMAL LOW (ref 12.0–15.0)
Hemoglobin: 8.7 g/dL — ABNORMAL LOW (ref 12.0–15.0)
MCH: 20 pg — ABNORMAL LOW (ref 26.0–34.0)
MCH: 20.5 pg — ABNORMAL LOW (ref 26.0–34.0)
MCHC: 28.7 g/dL — ABNORMAL LOW (ref 30.0–36.0)
MCHC: 29.8 g/dL — ABNORMAL LOW (ref 30.0–36.0)
MCV: 69.9 fL — ABNORMAL LOW (ref 80.0–100.0)
MCV: 68.9 fL — ABNORMAL LOW (ref 80.0–100.0)
Platelets: 333 10*3/uL (ref 150–400)
Platelets: 305 10*3/uL (ref 150–400)
RBC: 4.89 MIL/uL (ref 3.87–5.11)
RBC: 4.24 MIL/uL (ref 3.87–5.11)
RDW: 17.2 % — ABNORMAL HIGH (ref 11.5–15.5)
RDW: 17.1 % — ABNORMAL HIGH (ref 11.5–15.5)
WBC: 8.4 10*3/uL (ref 4.0–10.5)
WBC: 11.1 10*3/uL — ABNORMAL HIGH (ref 4.0–10.5)
nRBC: 0 % (ref 0.0–0.2)
nRBC: 0 % (ref 0.0–0.2)

## 2018-07-21 LAB — CREATININE, SERUM
Creatinine, Ser: 0.69 mg/dL (ref 0.44–1.00)
GFR calc Af Amer: >60 mL/min (ref >60)
GFR calc non Af Amer: >60 mL/min (ref >60)

## 2018-07-21 LAB — GLUCOSE, CAPILLARY
Glucose-Capillary: 110 mg/dL — ABNORMAL HIGH (ref 70–99)
Glucose-Capillary: 108 mg/dL — ABNORMAL HIGH (ref 70–99)

## 2018-07-21 LAB — RPR: RPR Ser Ql: NONREACTIVE

## 2018-07-21 LAB — ABO/RH: ABO/RH(D): AB POS

## 2018-07-21 SURGERY — Surgical Case
Anesthesia: Spinal | Laterality: Bilateral

## 2018-07-21 MED ORDER — CLINDAMYCIN PHOSPHATE 900 MG/50ML IV SOLN
INTRAVENOUS | Status: AC
  Filled 2018-07-21: qty 50

## 2018-07-21 MED ORDER — DIPHENHYDRAMINE HCL 25 MG PO CAPS: 25.0000 mg | ORAL_CAPSULE | ORAL | Status: DC | PRN

## 2018-07-21 MED ORDER — MORPHINE SULFATE (PF) 0.5 MG/ML IJ SOLN
INTRAMUSCULAR | Status: AC
  Filled 2018-07-21: qty 10

## 2018-07-21 MED ORDER — SODIUM CHLORIDE 0.9 % IV SOLN
INTRAVENOUS | Status: DC | PRN
  Administered 2018-07-21: 11:00:00 via INTRAVENOUS

## 2018-07-21 MED ORDER — NALBUPHINE HCL 10 MG/ML IJ SOLN: 5.0000 mg | Freq: Once | INTRAMUSCULAR | Status: DC | PRN

## 2018-07-21 MED ORDER — ONDANSETRON HCL 4 MG/2ML IJ SOLN
INTRAMUSCULAR | Status: AC
  Filled 2018-07-21: qty 2

## 2018-07-21 MED ORDER — SIMETHICONE 80 MG PO CHEW
80.0000 mg | CHEWABLE_TABLET | ORAL | Status: DC
  Administered 2018-07-22 (×2): 80 mg via ORAL
  Filled 2018-07-21 (×2): qty 1

## 2018-07-21 MED ORDER — GENTAMICIN SULFATE 40 MG/ML IJ SOLN
5.0000 mg/kg | INTRAVENOUS | Status: DC
  Filled 2018-07-21: qty 14

## 2018-07-21 MED ORDER — ONDANSETRON HCL 4 MG/2ML IJ SOLN
INTRAMUSCULAR | Status: DC | PRN
  Administered 2018-07-21: 4 mg via INTRAVENOUS

## 2018-07-21 MED ORDER — SENNOSIDES-DOCUSATE SODIUM 8.6-50 MG PO TABS
2.0000 | ORAL_TABLET | ORAL | Status: DC
  Administered 2018-07-22 (×2): 2 via ORAL
  Filled 2018-07-21 (×2): qty 2

## 2018-07-21 MED ORDER — SODIUM CHLORIDE 0.9% FLUSH: 3.0000 mL | INTRAVENOUS | Status: DC | PRN

## 2018-07-21 MED ORDER — DIPHENHYDRAMINE HCL 50 MG/ML IJ SOLN
12.5000 mg | INTRAMUSCULAR | Status: DC | PRN
  Administered 2018-07-21: 12.5 mg via INTRAVENOUS

## 2018-07-21 MED ORDER — SIMETHICONE 80 MG PO CHEW
80.0000 mg | CHEWABLE_TABLET | Freq: Three times a day (TID) | ORAL | Status: DC
  Administered 2018-07-21 – 2018-07-23 (×6): 80 mg via ORAL
  Filled 2018-07-21 (×6): qty 1

## 2018-07-21 MED ORDER — PHENYLEPHRINE 40 MCG/ML (10ML) SYRINGE FOR IV PUSH (FOR BLOOD PRESSURE SUPPORT)
PREFILLED_SYRINGE | INTRAVENOUS | Status: DC | PRN
  Administered 2018-07-21: 40 ug via INTRAVENOUS

## 2018-07-21 MED ORDER — LACTATED RINGERS IV SOLN
INTRAVENOUS | Status: DC
  Administered 2018-07-21 – 2018-07-22 (×2): via INTRAVENOUS

## 2018-07-21 MED ORDER — OXYTOCIN 40 UNITS IN NORMAL SALINE INFUSION - SIMPLE MED: 2.5000 [IU]/h | INTRAVENOUS | Status: AC

## 2018-07-21 MED ORDER — CLINDAMYCIN PHOSPHATE 900 MG/50ML IV SOLN
900.0000 mg | INTRAVENOUS | Status: AC
  Administered 2018-07-21: 900 mg via INTRAVENOUS

## 2018-07-21 MED ORDER — DIPHENHYDRAMINE HCL 25 MG PO CAPS: 25.0000 mg | ORAL_CAPSULE | Freq: Four times a day (QID) | ORAL | Status: DC | PRN

## 2018-07-21 MED ORDER — TETANUS-DIPHTH-ACELL PERTUSSIS 5-2.5-18.5 LF-MCG/0.5 IM SUSP: 0.5000 mL | Freq: Once | INTRAMUSCULAR | Status: DC

## 2018-07-21 MED ORDER — WITCH HAZEL-GLYCERIN EX PADS: 1.0000 "application " | MEDICATED_PAD | CUTANEOUS | Status: DC | PRN

## 2018-07-21 MED ORDER — FENTANYL CITRATE (PF) 100 MCG/2ML IJ SOLN: 25.0000 ug | INTRAMUSCULAR | Status: DC | PRN

## 2018-07-21 MED ORDER — FENTANYL CITRATE (PF) 100 MCG/2ML IJ SOLN
INTRAMUSCULAR | Status: AC
  Filled 2018-07-21: qty 2

## 2018-07-21 MED ORDER — PHENYLEPHRINE HCL-NACL 20-0.9 MG/250ML-% IV SOLN
INTRAVENOUS | Status: DC | PRN
  Administered 2018-07-21: 60 ug/min via INTRAVENOUS

## 2018-07-21 MED ORDER — LACTATED RINGERS IV SOLN
INTRAVENOUS | Status: DC | PRN
  Administered 2018-07-21 (×2): via INTRAVENOUS

## 2018-07-21 MED ORDER — SIMETHICONE 80 MG PO CHEW
80.0000 mg | CHEWABLE_TABLET | ORAL | Status: DC | PRN
  Administered 2018-07-22: 80 mg via ORAL
  Filled 2018-07-21: qty 1

## 2018-07-21 MED ORDER — OXYCODONE HCL 5 MG PO TABS: 5.0000 mg | ORAL_TABLET | Freq: Once | ORAL | Status: DC | PRN

## 2018-07-21 MED ORDER — DIPHENHYDRAMINE HCL 50 MG/ML IJ SOLN
INTRAMUSCULAR | Status: AC
  Filled 2018-07-21: qty 1

## 2018-07-21 MED ORDER — NALBUPHINE HCL 10 MG/ML IJ SOLN: 5.0000 mg | INTRAMUSCULAR | Status: DC | PRN

## 2018-07-21 MED ORDER — GENTAMICIN SULFATE 40 MG/ML IJ SOLN
5.0000 mg/kg | INTRAVENOUS | Status: AC
  Administered 2018-07-21: 408 mg via INTRAVENOUS
  Filled 2018-07-21: qty 10.25

## 2018-07-21 MED ORDER — NALOXONE HCL 4 MG/10ML IJ SOLN
1.0000 ug/kg/h | INTRAVENOUS | Status: DC | PRN
  Filled 2018-07-21: qty 5

## 2018-07-21 MED ORDER — PHENYLEPHRINE HCL-NACL 20-0.9 MG/250ML-% IV SOLN
INTRAVENOUS | Status: AC
  Filled 2018-07-21: qty 250

## 2018-07-21 MED ORDER — ENOXAPARIN SODIUM 60 MG/0.6ML ~~LOC~~ SOLN
0.5000 mg/kg | SUBCUTANEOUS | Status: DC
  Administered 2018-07-22 – 2018-07-23 (×2): 55 mg via SUBCUTANEOUS
  Filled 2018-07-21 (×2): qty 0.6

## 2018-07-21 MED ORDER — FENTANYL CITRATE (PF) 100 MCG/2ML IJ SOLN
INTRAMUSCULAR | Status: DC | PRN
  Administered 2018-07-21: 15 ug via INTRATHECAL

## 2018-07-21 MED ORDER — SOD CITRATE-CITRIC ACID 500-334 MG/5ML PO SOLN
30.0000 mL | ORAL | Status: AC
  Administered 2018-07-21: 30 mL via ORAL

## 2018-07-21 MED ORDER — SOD CITRATE-CITRIC ACID 500-334 MG/5ML PO SOLN
ORAL | Status: AC
  Filled 2018-07-21: qty 30

## 2018-07-21 MED ORDER — MORPHINE SULFATE (PF) 0.5 MG/ML IJ SOLN
INTRAMUSCULAR | Status: DC | PRN
  Administered 2018-07-21: .15 mg via INTRATHECAL

## 2018-07-21 MED ORDER — BUPIVACAINE HCL (PF) 0.25 % IJ SOLN
INTRAMUSCULAR | Status: AC
  Filled 2018-07-21: qty 30

## 2018-07-21 MED ORDER — ZOLPIDEM TARTRATE 5 MG PO TABS: 5.0000 mg | ORAL_TABLET | Freq: Every evening | ORAL | Status: DC | PRN

## 2018-07-21 MED ORDER — BUPIVACAINE IN DEXTROSE 0.75-8.25 % IT SOLN
INTRATHECAL | Status: DC | PRN
  Administered 2018-07-21: 1.6 mL via INTRATHECAL

## 2018-07-21 MED ORDER — IBUPROFEN 600 MG PO TABS
600.0000 mg | ORAL_TABLET | Freq: Four times a day (QID) | ORAL | Status: DC | PRN
  Administered 2018-07-21 – 2018-07-23 (×5): 600 mg via ORAL
  Filled 2018-07-21 (×5): qty 1

## 2018-07-21 MED ORDER — ONDANSETRON HCL 4 MG/2ML IJ SOLN: 4.0000 mg | Freq: Three times a day (TID) | INTRAMUSCULAR | Status: DC | PRN

## 2018-07-21 MED ORDER — MENTHOL 3 MG MT LOZG: 1.0000 | LOZENGE | OROMUCOSAL | Status: DC | PRN

## 2018-07-21 MED ORDER — SCOPOLAMINE 1 MG/3DAYS TD PT72
1.0000 | MEDICATED_PATCH | Freq: Once | TRANSDERMAL | Status: DC
  Administered 2018-07-21: 1.5 mg via TRANSDERMAL

## 2018-07-21 MED ORDER — COCONUT OIL OIL: 1.0000 "application " | TOPICAL_OIL | Status: DC | PRN

## 2018-07-21 MED ORDER — OXYCODONE HCL 5 MG PO TABS: 5.0000 mg | ORAL_TABLET | ORAL | Status: DC | PRN

## 2018-07-21 MED ORDER — CLINDAMYCIN PHOSPHATE 900 MG/50ML IV SOLN: 900.0000 mg | INTRAVENOUS | Status: DC

## 2018-07-21 MED ORDER — MEPERIDINE HCL 25 MG/ML IJ SOLN: 6.2500 mg | INTRAMUSCULAR | Status: DC | PRN

## 2018-07-21 MED ORDER — PRENATAL MULTIVITAMIN CH
1.0000 | ORAL_TABLET | Freq: Every day | ORAL | Status: DC
  Administered 2018-07-22 – 2018-07-23 (×2): 1 via ORAL
  Filled 2018-07-21 (×2): qty 1

## 2018-07-21 MED ORDER — METFORMIN HCL ER 500 MG PO TB24
1000.0000 mg | ORAL_TABLET | Freq: Every day | ORAL | Status: DC
  Administered 2018-07-22 – 2018-07-23 (×2): 1000 mg via ORAL
  Filled 2018-07-21 (×3): qty 2

## 2018-07-21 MED ORDER — DIBUCAINE 1 % RE OINT: 1.0000 "application " | TOPICAL_OINTMENT | RECTAL | Status: DC | PRN

## 2018-07-21 MED ORDER — OXYTOCIN 10 UNIT/ML IJ SOLN
INTRAVENOUS | Status: DC | PRN
  Administered 2018-07-21: 40 [IU] via INTRAVENOUS

## 2018-07-21 MED ORDER — NALOXONE HCL 0.4 MG/ML IJ SOLN: 0.4000 mg | INTRAMUSCULAR | Status: DC | PRN

## 2018-07-21 MED ORDER — ACETAMINOPHEN 500 MG PO TABS
1000.0000 mg | ORAL_TABLET | Freq: Four times a day (QID) | ORAL | Status: DC | PRN
  Administered 2018-07-21 – 2018-07-22 (×2): 1000 mg via ORAL
  Filled 2018-07-21 (×2): qty 2

## 2018-07-21 MED ORDER — OXYCODONE HCL 5 MG/5ML PO SOLN: 5.0000 mg | Freq: Once | ORAL | Status: DC | PRN

## 2018-07-21 MED ORDER — OXYTOCIN 40 UNITS IN NORMAL SALINE INFUSION - SIMPLE MED
INTRAVENOUS | Status: AC
  Filled 2018-07-21: qty 1000

## 2018-07-21 MED ORDER — ONDANSETRON HCL 4 MG/2ML IJ SOLN: 4.0000 mg | Freq: Once | INTRAMUSCULAR | Status: DC | PRN

## 2018-07-21 MED ORDER — SCOPOLAMINE 1 MG/3DAYS TD PT72
MEDICATED_PATCH | TRANSDERMAL | Status: AC
  Filled 2018-07-21: qty 1

## 2018-07-21 SURGICAL SUPPLY — 34 items
BENZOIN TINCTURE PRP APPL 2/3 (GAUZE/BANDAGES/DRESSINGS) ×2 IMPLANT
CANISTER SUCT 3000ML PPV (MISCELLANEOUS) ×2 IMPLANT
CHLORAPREP W/TINT 26ML (MISCELLANEOUS) ×2 IMPLANT
CLAMP CORD UMBIL (MISCELLANEOUS) ×2 IMPLANT
CLOTH BEACON ORANGE TIMEOUT ST (SAFETY) ×2 IMPLANT
DRAPE SHEET LG 3/4 BI-LAMINATE (DRAPES) ×2 IMPLANT
DRSG OPSITE POSTOP 4X10 (GAUZE/BANDAGES/DRESSINGS) ×2 IMPLANT
ELECT REM PT RETURN 9FT ADLT (ELECTROSURGICAL) ×2
ELECTRODE REM PT RTRN 9FT ADLT (ELECTROSURGICAL) ×1 IMPLANT
GAUZE SPONGE 4X4 12PLY STRL LF (GAUZE/BANDAGES/DRESSINGS) ×4 IMPLANT
GLOVE BIOGEL PI IND STRL 7.0 (GLOVE) ×3 IMPLANT
GLOVE BIOGEL PI INDICATOR 7.0 (GLOVE) ×3
GLOVE ECLIPSE 6.5 STRL STRAW (GLOVE) ×2 IMPLANT
GOWN STRL REUS W/ TWL LRG LVL3 (GOWN DISPOSABLE) ×2 IMPLANT
GOWN STRL REUS W/TWL LRG LVL3 (GOWN DISPOSABLE) ×2
NEEDLE HYPO 22GX1.5 SAFETY (NEEDLE) ×2 IMPLANT
NS IRRIG 1000ML POUR BTL (IV SOLUTION) ×2 IMPLANT
PAD ABD 7.5X8 STRL (GAUZE/BANDAGES/DRESSINGS) ×2 IMPLANT
PAD OB MATERNITY 4.3X12.25 (PERSONAL CARE ITEMS) ×2 IMPLANT
PAD PREP 24X48 CUFFED NSTRL (MISCELLANEOUS) ×2 IMPLANT
RETRACTOR WND ALEXIS 25 LRG (MISCELLANEOUS) ×1 IMPLANT
RTRCTR WOUND ALEXIS 25CM LRG (MISCELLANEOUS) ×2
SPONGE LAP 18X18 RF (DISPOSABLE) ×6 IMPLANT
STRIP CLOSURE SKIN 1/2X4 (GAUZE/BANDAGES/DRESSINGS) ×2 IMPLANT
SUT PLAIN 0 NONE (SUTURE) ×2 IMPLANT
SUT PLAIN 2 0 XLH (SUTURE) ×2 IMPLANT
SUT VIC AB 0 CT1 36 (SUTURE) ×2 IMPLANT
SUT VIC AB 2-0 CT1 27 (SUTURE) ×1
SUT VIC AB 2-0 CT1 TAPERPNT 27 (SUTURE) ×1 IMPLANT
SUT VIC AB 4-0 KS 27 (SUTURE) ×2 IMPLANT
SYR CONTROL 10ML LL (SYRINGE) ×2 IMPLANT
TOWEL OR 17X24 6PK STRL BLUE (TOWEL DISPOSABLE) ×6 IMPLANT
TRAY FOLEY CATH SILVER 16FR (SET/KITS/TRAYS/PACK) ×2 IMPLANT
WATER STERILE IRR 1000ML POUR (IV SOLUTION) ×2 IMPLANT

## 2018-07-21 NOTE — Op Note (Addendum)
Cesarean Section Operative Report  Christina Morton  07/21/2018  Indications: History of classical cesarean section, undesired fertility  Pre-operative Diagnosis: Repeat Cesarean Section and bilateral tubal ligation .   Post-operative Diagnosis: Same   Surgeon: Surgeon(s) and Role:    * Meia Emley, Wilfred Curtis, MD  - Primary    * Gwenevere Abbot, MD - Fellow  Attending Attestation: I was present and scrubbed for the entire procedure.   Assistants: Gwenevere Abbot, MD - Fellow  Anesthesia: spinal    Estimated Blood Loss: 219 ml  Total IV Fluids: 2600 ml LR  Urine Output:: 50 ml clear urine  Specimens: bilateral fallopian tube segments  Findings: Viable female infant in cephalic presentation; Apgars 9, 9; weight 2925 g; arterial cord pH: not collected; clear amniotic fluid; intact placenta with three vessel cord; normal uterus, fallopian tubes and ovaries bilaterally.  Baby condition / location:  Couplet care / Skin to Skin   Complications: no complications  Indications: Christina Morton is a 33 y.o. [id number] with an IUP [redacted]w[redacted]d presenting for scheduled repeat LTCS and bilateral tubal ligation.  The risks, benefits, complications, treatment options, and exected outcomes were discussed with the patient . The patient dwith the proposed plan, giving informed consent. identified as Christina Morton and the procedure verified as C-Section Delivery.  Procedure Details:  The patient was taken back to the operative suite where spinal anesthesia was placed.  A time out was held and the above information confirmed.   After induction of anesthesia, the patient was draped and prepped in the usual sterile manner and placed in a dorsal supine position with a leftward tilt. A Pfannenstiel incision was made and carried down through the subcutaneous tissue to the fascia. Fascial incision was made and sharply extended transversely. The fascia was separated from the underlying rectus tissue superiorly and  inferiorly. The peritoneum was identified and bluntly entered and extended longitudinally. Alexis retractor was placed. A low transverse uterine incision was made and extended bluntly. Delivered from cephalic presentation was a viable infant with Apgars and weight as above.  After waiting 60 seconds for delayed cord cutting, the umbilical cord was clamped and cut cord blood was obtained for evaluation. Cord ph was not sent. The placenta was initially adherent, but removed Intact manually and appeared normal. The uterine outline, tubes and ovaries appeared normal. An extension was noted in the same course as prior classical incision that extended about 1 inch cephalad. That extension was repaired with one lay of locked 0 vicryl. The low transverse incision was closed with running locked sutures of 0-Vicryl with an imbricating layer of the same. Hemostasis was observed.  The right fallopian tube was identified including the fimbriated end.  The right fallopian tube was grasped and a segment was removed using the modified Pomeroy technique.  There was good hemostasis.  Attention was then turned to the left fallopian tube which was identified including the fimbriated end.  A  segment in the distal isthmic and ampullary region portion of the tube was removed again using a modified Pomeroy technique.  There was good hemostasis. The peritoneum was closed with 0 vicryl. The rectus muscles were examined and hemostasis observed. The fascia was then reapproximated with running sutures of 0 Vicryl. The subcuticular closure was performed using 0plain gut. The skin was closed with 4-0 Vicryl.   Instrument, sponge, and needle counts were correct prior the abdominal closure and were correct at the conclusion of the case.     Disposition: PACU - hemodynamically stable.  Maternal Condition: stable   Signed: Nimeka PhillipMD 07/21/2018 12:11 PM   Attestation of Attending Supervision of Provider:  Evaluation and management  procedures were performed by this provider under my supervision and collaboration. I have reviewed the provider's note and chart, and I agree with the management and plan.   Christina Chock, MD Faculty Practice, [facility]

## 2018-07-21 NOTE — Discharge Summary (Signed)
Postpartum Discharge Summary     Patient Name: Christina Morton DOB: May 18, 1985 MRN: [id number]  Date of admission: 07/21/2018 Delivering Provider: Shonna Chock [place]   Date of discharge: 07/23/2018  Admitting diagnosis: RCS Previous Classical C-Section Undesired Fertility Intrauterine pregnancy: [redacted]w[redacted]d     Secondary diagnosis:  Principal Problem:   Status post repeat low transverse cesarean section Active Problems:   Diabetes mellitus complicating pregnancy, antepartum   Depression   History of classical cesarean section   Unwanted fertility   Excoriation (skin-picking) disorder  Additional problems: none     Discharge diagnosis: Term Pregnancy Delivered, Type 2 DM and Anemia                                                                                                Post partum procedures:postpartum tubal ligation  Augmentation: NA  Complications: None  Hospital course:  Sceduled C/S   33 y.o. yo G2P2002 at [redacted]w[redacted]d was admitted to the hospital 07/21/2018 for scheduled cesarean section with the following indication:history of classical cesarean section.  Membrane Rupture Time/Date: 10:34 AM ,07/21/2018   Patient delivered a Viable infant.07/21/2018  Details of operation can be found in separate operative note.  Pateint had an uncomplicated postpartum course.  She is ambulating, tolerating a regular diet, passing flatus, and urinating well. Patient is discharged home in stable condition on  07/23/18         Magnesium Sulfate recieved: No BMZ received: No  Physical exam  Vitals:   07/22/18 1005 07/22/18 1421 07/22/18 2323 07/23/18 0447  BP: (!) 95/57 103/66 117/68 131/71  Pulse: 89 96 76 85  Resp: 18 18 18 20   Temp: 98.8 F (37.1 C) 98.7 F (37.1 C) 98.4 F (36.9 C) 98.4 F (36.9 C)  TempSrc: Oral Oral Oral Oral  SpO2:  100%  100%  Weight:      Height:       General: alert, cooperative and no distress Lochia: appropriate Uterine Fundus: firm Incision: Healing well  with no significant drainage, No significant erythema, Dressing is clean, dry, and intact DVT Evaluation: No evidence of DVT seen on physical exam. Labs: Lab Results  Component Value Date   WBC 8.7 07/22/2018   HGB 8.1 (L) 07/22/2018   HCT 26.9 (L) 07/22/2018   MCV 70.1 (L) 07/22/2018   PLT 269 07/22/2018   CMP Latest Ref Rng & Units 07/23/2018  Glucose 70 - 99 mg/dL 552(C)  Creatinine 8.02 - 1.00 mg/dL -    Discharge instruction: per After Visit Summary and "Baby and Me Booklet".  After visit meds:  Allergies as of 07/23/2018      Reactions   Amoxicillin Anaphylaxis   Did it involve swelling of the face/tongue/throat, SOB, or low BP? Yes Did it involve sudden or severe rash/hives, skin peeling, or any reaction on the inside of your mouth or nose? No Did you need to seek medical attention at a hospital or doctor's office? Yes When did it last happen?childhood allergy If all above answers are "NO", may proceed with cephalosporin use.   Penicillins Anaphylaxis   Did it involve swelling  of the face/tongue/throat, SOB, or low BP? Yes Did it involve sudden or severe rash/hives, skin peeling, or any reaction on the inside of your mouth or nose? No Did you need to seek medical attention at a hospital or doctor's office? Yes When did it last happen?childhood allergy If all above answers are "NO", may proceed with cephalosporin use.      Medication List    STOP taking these medications   Accu-Chek FastClix Lancets Misc   aspirin EC 81 MG tablet   glucose blood test strip Commonly known as:  Accu-Chek Guide   insulin aspart 100 UNIT/ML injection Commonly known as:  novoLOG   insulin NPH Human 100 UNIT/ML injection Commonly known as:  HUMULIN N,NOVOLIN N   INSULIN SYRINGE .5CC/31GX5/16" 31G X 5/16" 0.5 ML Misc   Insulin Syringe-Needle U-100 31G X 5/16" 0.5 ML Misc   Melatonin 2.5 MG Chew   metFORMIN 1000 MG tablet Commonly known as:  GLUCOPHAGE   Prenatal  Vitamins 0.8 MG tablet     TAKE these medications   ibuprofen 600 MG tablet Commonly known as:  ADVIL,MOTRIN Take 1 tablet (600 mg total) by mouth every 6 (six) hours as needed for fever or headache.   oxyCODONE 5 MG immediate release tablet Commonly known as:  Oxy IR/ROXICODONE Take 1-2 tablets (5-10 mg total) by mouth every 4 (four) hours as needed for moderate pain.   sertraline 50 MG tablet Commonly known as:  ZOLOFT Take 1 tablet (50 mg total) by mouth daily.       Diet: routine diet  Activity: Advance as tolerated. Pelvic rest for 6 weeks.   Outpatient follow up:6 weeks Follow up Appt: Future Appointments  Date Time Provider Department Center  08/23/2018 12:30 PM Sherrie George, MD [facility] None   Follow up Visit: Please schedule this patient for Postpartum visit in: 6 weeks with the following provider: Any provider For C/S patients schedule nurse incision check in weeks 2 weeks: yes High risk pregnancy complicated by: DM2, hx of classical cesarean section, obesity  Delivery mode:  CS Anticipated Birth Control:  BTL done PP PP Procedures needed: Incision check  Schedule Integrated BH visit: no  Newborn Data: Live born female  Birth Weight: 6 lb 7.2 oz (2925 g) APGAR: 9, 9  Newborn Delivery   Birth date/time:  07/21/2018 10:35:00 Delivery type:  C-Section, Low Transverse Trial of labor:  No C-section categorization:  Repeat     Baby Feeding: Bottle Disposition:home with mother   07/23/2018 Jacklyn Shell, CNM

## 2018-07-21 NOTE — Anesthesia Procedure Notes (Signed)
Spinal  Patient location during procedure: OR Staffing Anesthesiologist: Mariya Mottley E, MD Performed: anesthesiologist  Preanesthetic Checklist Completed: patient identified, surgical consent, pre-op evaluation, timeout performed, IV checked, risks and benefits discussed and monitors and equipment checked Spinal Block Patient position: sitting Prep: site prepped and draped and DuraPrep Patient monitoring: continuous pulse ox, blood pressure and heart rate Approach: midline Location: L3-4 Injection technique: single-shot Needle Needle type: Pencan  Needle gauge: 24 G Needle length: 9 cm Additional Notes Functioning IV was confirmed and monitors were applied. Sterile prep and drape, including hand hygiene and sterile gloves were used. The patient was positioned and the spine was prepped. The skin was anesthetized with lidocaine.  Free flow of clear CSF was obtained prior to injecting local anesthetic into the CSF. The needle was carefully withdrawn. The patient tolerated the procedure well.      

## 2018-07-21 NOTE — Progress Notes (Addendum)
Spoke to Dr Alfonso Patten by phone to clarify orders for that next glucose order is for AM 07/22/18 and Metformin 1,000 mg with breakfast. No further glucose orders received. Requested orders for Tylenol, toradol or motrin. Dr Tawanna Sat will place the medication orders.

## 2018-07-21 NOTE — Anesthesia Preprocedure Evaluation (Signed)
Anesthesia Evaluation  Patient identified by MRN, date of birth, ID band Patient awake    Reviewed: Allergy & Precautions, H&P , NPO status , Patient's Chart, lab work & pertinent test results  History of Anesthesia Complications Negative for: history of anesthetic complications  Airway Mallampati: II  TM Distance: >3 FB Neck ROM: full    Dental no notable dental hx.    Pulmonary neg pulmonary ROS, former smoker,    Pulmonary exam normal        Cardiovascular negative cardio ROS Normal cardiovascular exam     Neuro/Psych negative neurological ROS  negative psych ROS   GI/Hepatic negative GI ROS, Neg liver ROS,   Endo/Other  diabetes, Type 2, Insulin Dependent, Oral Hypoglycemic Agents  Renal/GU negative Renal ROS  negative genitourinary   Musculoskeletal   Abdominal   Peds  Hematology  (+) Blood dyscrasia, anemia ,   Anesthesia Other Findings IDDMII, anemia Hgb 9.8, plts 333  Reproductive/Obstetrics (+) Pregnancy Prior C/S x1 (classical)                             Anesthesia Physical Anesthesia Plan  ASA: II  Anesthesia Plan: Spinal   Post-op Pain Management:    Induction:   PONV Risk Score and Plan: Ondansetron and Treatment may vary due to age or medical condition  Airway Management Planned:   Additional Equipment:   Intra-op Plan:   Post-operative Plan:   Informed Consent: I have reviewed the patients History and Physical, chart, labs and discussed the procedure including the risks, benefits and alternatives for the proposed anesthesia with the patient or authorized representative who has indicated his/her understanding and acceptance.       Plan Discussed with:   Anesthesia Plan Comments:         Anesthesia Quick Evaluation

## 2018-07-21 NOTE — H&P (Signed)
LABOR AND DELIVERY ADMISSION HISTORY AND PHYSICAL NOTE  Christina Morton is a 33 y.o. female G2P1001 with IUP at [redacted]w[redacted]d by first trimester u/s presenting for scheduled repeat c/s.   She reports positive fetal movement. She denies leakage of fluid or vaginal bleeding.  Took half nightly insulin last night, none this am.  Prenatal History/Complications:  Past Medical History: Past Medical History:  Diagnosis Date  . Anemia   . Depression   . Glaucoma   . Mental disorder   . Type 2 diabetes mellitus without complications [facility])     Past Surgical History: Past Surgical History:  Procedure Laterality Date  . CESAREAN SECTION    . CYSTECTOMY      Obstetrical History: OB History    Gravida  2   Para  1   Term  1   Preterm  0   AB  0   Living  1     SAB  0   TAB  0   Ectopic  0   Multiple  0   Live Births  1           Social History: Social History   Socioeconomic History  . Marital status: Single    Spouse name: Not on file  . Number of children: Not on file  . Years of education: Not on file  . Highest education level: Not on file  Occupational History  . Not on file  Social Needs  . Financial resource strain: Not on file  . Food insecurity:    Worry: Never true    Inability: Never true  . Transportation needs:    Medical: No    Non-medical: No  Tobacco Use  . Smoking status: Former Smoker    Packs/day: 1.00    Years: 1.00    Pack years: 1.00    Types: Cigarettes    Last attempt to quit: 11/22/2017    Years since quitting: 0.6  . Smokeless tobacco: Never Used  Substance and Sexual Activity  . Alcohol use: Never    Frequency: Never  . Drug use: Never  . Sexual activity: Yes    Birth control/protection: None  Lifestyle  . Physical activity:    Days per week: Not on file    Minutes per session: Not on file  . Stress: Not on file  Relationships  . Social connections:    Talks on phone: Not on file    Gets together: Not on file   Attends religious service: Not on file    Active member of club or organization: Not on file    Attends meetings of clubs or organizations: Not on file    Relationship status: Not on file  Other Topics Concern  . Not on file  Social History Narrative  . Not on file    Family History: Family History  Problem Relation Age of Onset  . Hypertrophic cardiomyopathy Mother   . Bradycardia Mother     Allergies: Allergies  Allergen Reactions  . Amoxicillin Anaphylaxis    Did it involve swelling of the face/tongue/throat, SOB, or low BP? Yes Did it involve sudden or severe rash/hives, skin peeling, or any reaction on the inside of your mouth or nose? No Did you need to seek medical attention at a hospital or doctor's office? Yes When did it last happen?childhood allergy If all above answers are "NO", may proceed with cephalosporin use.  [name] Penicillins Anaphylaxis    Did it involve swelling of the face/tongue/throat, SOB,  or low BP? Yes Did it involve sudden or severe rash/hives, skin peeling, or any reaction on the inside of your mouth or nose? No Did you need to seek medical attention at a hospital or doctor's office? Yes When did it last happen?childhood allergy If all above answers are "NO", may proceed with cephalosporin use.     Medications Prior to Admission  Medication Sig Dispense Refill Last Dose  . aspirin EC 81 MG tablet Take 1 tablet (81 mg total) by mouth daily. Take after 12 weeks for prevention of preeclampsia later in pregnancy 300 tablet 2 07/20/2018 at Unknown time  . insulin aspart (NOVOLOG) 100 UNIT/ML injection Inject 5 Units into the skin daily before supper. 10 mL 12 Taking  . insulin NPH Human (HUMULIN N,NOVOLIN N) 100 UNIT/ML injection Inject 0.1 mLs (10 Units total) into the skin at bedtime. (Patient taking differently: Inject 10-15 Units into the skin 2 (two) times daily. 10 units Before breakfast and 15 units after dinner) 10 mL 3 07/20/2018 at  Unknown time  . Melatonin 2.5 MG CHEW Chew 2.5 mg by mouth at bedtime.    Past Week at Unknown time  . metFORMIN (GLUCOPHAGE) 1000 MG tablet Take 1 tablet (1,000 mg total) by mouth 2 (two) times daily with a meal. 60 tablet 5 Past Week at Unknown time  . Prenatal Multivit-Min-Fe-FA (PRENATAL VITAMINS) 0.8 MG tablet Take 1 tablet by mouth daily. 30 tablet 12 07/20/2018 at Unknown time  . ACCU-CHEK FASTCLIX LANCETS MISC 1 Device by Percutaneous route 4 (four) times daily. 100 each 12 Taking  . glucose blood (ACCU-CHEK GUIDE) test strip Use as instructed QID 100 each 12 Taking  . Insulin Syringe-Needle U-100 (INSULIN SYRINGE .5CC/31GX5/16") 31G X 5/16" 0.5 ML MISC Use as directed 100 each 6 Taking  . Insulin Syringe-Needle U-100 31G X 5/16" 0.5 ML MISC Inject 1 Syringe into the skin 2 (two) times daily. 100 each 12 Taking     Review of Systems   All systems reviewed and negative except as stated in HPI  Blood pressure 122/86, pulse (!) 104, temperature 98.7 F (37.1 C), temperature source Oral, resp. rate 18, height 5\' 7"  (1.702 m), weight 114.3 kg, last menstrual period 11/04/2017, SpO2 99 %. General appearance: alert, cooperative and appears stated age Lungs: clear to auscultation bilaterally Heart: regular rate and rhythm Abdomen: soft, non-tender; bowel sounds normal Extremities: No calf swelling or tenderness   Prenatal labs: ABO, Rh: --/--/AB POS, AB POS Performed at [facility], [address] [place], [place] [address]  [phone]) Antibody: NEG (04/01 0720) Rubella: Immune (09/19 0000) RPR: Non Reactive (02/03 1351)  HBsAg:   neg HIV: Non Reactive (02/03 1351)  GBS:   not performed 1 hr Glucola: n/a Genetic screening:  Nips low risk Anatomy US: wnl  Prenatal Transfer Tool  Maternal Diabetes: Yes:  Diabetes Type:  Pre-pregnancy Genetic Screening: Normal Maternal Ultrasounds/Referrals: Normal Fetal Ultrasounds or other Referrals:  Do not see results of fetal echo  in chart Maternal Substance Abuse:  No Significant Maternal Medications:  Insulin, metformin Significant Maternal Lab Results: gbs unk  Results for orders placed or performed during the hospital encounter of 07/21/18 (from the past 24 hour(s))  CBC   Collection Time: 07/21/18  7:20 AM  Result Value Ref Range   WBC 8.4 4.0 - 10.5 K/uL   RBC 4.89 3.87 - 5.11 MIL/uL   Hemoglobin 9.8 (L) 12.0 - 15.0 g/dL   HCT 39.0 (L) 30.0 - 92.3 %  MCV 69.9 (L) 80.0 - 100.0 fL   MCH 20.0 (L) 26.0 - 34.0 pg   MCHC 28.7 (L) 30.0 - 36.0 g/dL   RDW 40.9 (H) 81.1 - 91.4 %   Platelets 333 150 - 400 K/uL   nRBC 0.0 0.0 - 0.2 %  Type and screen [facility]   Collection Time: 07/21/18  7:20 AM  Result Value Ref Range   ABO/RH(D) AB POS    Antibody Screen NEG    Sample Expiration      07/24/2018 Performed at [facility], [address] [place], [place] [address]   ABO/Rh   Collection Time: 07/21/18  7:20 AM  Result Value Ref Range   ABO/RH(D)      AB POS Performed at [facility], [address], [place], [place] [address]   Glucose, capillary   Collection Time: 07/21/18  7:43 AM  Result Value Ref Range   Glucose-Capillary 110 (H) 70 - 99 mg/dL    Patient Active Problem List   Diagnosis Date Noted  . History of classical cesarean section 07/21/2018  . Obesity in pregnancy 05/24/2018  . Weight loss 04/23/2018  . Depression 03/23/2018  . Glaucoma 03/23/2018  . Supervision of high risk pregnancy, antepartum 01/21/2018  . History of cesarean section, classical 01/21/2018  . 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 Family history of heart disease 01/21/2018  . Diabetes mellitus complicating pregnancy, antepartum 01/21/2018    Assessment: Christina Morton is a 33 y.o. G2P1001 at [redacted]w[redacted]d here for scheduled repeat c/s.   The risks of cesarean section were discussed with the patient including but were not limited to: bleeding which may require transfusion or reoperation; infection  which may require antibiotics; injury to bowel, bladder, ureters or other surrounding organs; injury to the fetus; need for additional procedures including hysterectomy in the event of a life-threatening hemorrhage; placental abnormalities wth subsequent pregnancies, incisional problems, thromboembolic phenomenon and other postoperative/anesthesia complications.  Patient also desires permanent sterilization.  Other reversible forms of contraception were discussed with patient; she declines all other modalities. Risks of procedure discussed with patient including but not limited to: risk of regret, permanence of method, bleeding, infection, injury to surrounding organs and need for additional procedures.  Failure risk of 1-2% with increased risk of ectopic gestation if pregnancy occurs was also discussed with patient.  The patient concurred with the proposed plan, giving informed written consent for the procedures.    #Labor: scheduled repeat today at 37+0 for history classical incision #pregestational dm: a1c wnl last check, glucose this morning wnl. Was on metformin 500 bid prior to pregnancy and reports good control with that, so plan will be to resume that postpartum #Anemia: h 9.8, verbal consent given should blood products be needed #penicillin allergy: clinda/gent ordered #LGA infant: noted #ID:  No indication for gbs ppx #MOF: bottle #MOC: btl, consent papers signed #Circ:  n/a  Silvano Bilis 07/21/2018, 9:39 AM

## 2018-07-22 ENCOUNTER — Encounter (HOSPITAL_COMMUNITY): Payer: Self-pay | Admitting: Obstetrics and Gynecology

## 2018-07-22 DIAGNOSIS — F424 Excoriation (skin-picking) disorder: Secondary | ICD-10-CM

## 2018-07-22 LAB — CBC
HCT: 26.9 % — ABNORMAL LOW (ref 36.0–46.0)
Hemoglobin: 8.1 g/dL — ABNORMAL LOW (ref 12.0–15.0)
MCH: 21.1 pg — ABNORMAL LOW (ref 26.0–34.0)
MCHC: 30.1 g/dL (ref 30.0–36.0)
MCV: 70.1 fL — ABNORMAL LOW (ref 80.0–100.0)
Platelets: 269 10*3/uL (ref 150–400)
RBC: 3.84 MIL/uL — ABNORMAL LOW (ref 3.87–5.11)
RDW: 17.2 % — ABNORMAL HIGH (ref 11.5–15.5)
WBC: 8.7 10*3/uL (ref 4.0–10.5)
nRBC: 0 % (ref 0.0–0.2)

## 2018-07-22 LAB — GLUCOSE, RANDOM: Glucose, Bld: 91 mg/dL (ref 70–99)

## 2018-07-22 LAB — GLUCOSE, CAPILLARY: Glucose-Capillary: 86 mg/dL (ref 70–99)

## 2018-07-22 LAB — HEPATITIS B SURFACE ANTIGEN: Hepatitis B Surface Ag: NEGATIVE

## 2018-07-22 MED ORDER — SERTRALINE HCL 50 MG PO TABS
50.0000 mg | ORAL_TABLET | Freq: Every day | ORAL | Status: DC
  Administered 2018-07-22 – 2018-07-23 (×2): 50 mg via ORAL
  Filled 2018-07-22 (×2): qty 1

## 2018-07-22 MED ORDER — FERROUS SULFATE 325 (65 FE) MG PO TABS
325.0000 mg | ORAL_TABLET | Freq: Every day | ORAL | Status: DC
  Administered 2018-07-22 – 2018-07-23 (×2): 325 mg via ORAL
  Filled 2018-07-22 (×2): qty 1

## 2018-07-22 NOTE — Progress Notes (Signed)
CSW received consult for hx of  Depression.  CSW met with MOB to offer support and complete assessment.    CSW spoke with MOB at bedside. Upon arrival to [facility] room CSW observed that MOB's mom was at bedside. CSW explained HIPPA policy in the hospital and MOB requested that her mother stay. CSW understanding and proceed with assessment. CSW was advised that MOB was diagnosed with depression between the years of 2007-2009. MOB reports that she was on medication at one time however chose to not take it any longer in 2007. MOB reports that she is currently seeing Roselyn Reef with [facility]. MOB reports that her last session with Roselyn Reef was in 06/2018. CSW was advised that MOB is planning to follow back up with Roselyn Reef once settled in with infant. MOB reports that these sessions are very helpful in helping manage he depression. MOB's mom sought further details on how depression signs and symptoms can be prevented/addressed before they began. CSW suggested that MOB continue to see [facility] therapist and inform her of any signs early on so that they can work together to address further coping skills that may prevent increase in symptoms of depression. MOB currently denying SI, HI, or DV.  MOB identifies her mother, sister and boyfriend (FOB) as supports for her and infant. MOB is set up with WIC/FS and plans to add infant on both. MOB reports having all needed items to care for infant at this time.   CSW provided education regarding the baby blues period vs. perinatal mood disorders, discussed treatment and gave resources for mental health follow up if concerns arise.  CSW recommends self-evaluation during the postpartum time period using the New Mom Checklist from Postpartum Progress and encouraged MOB to contact a medical professional if symptoms are noted at any time.   CSW provided review of Sudden Infant Death Syndrome (SIDS) precautions.   CSW identifies no further need for intervention and no  barriers to discharge at this time.   Virgie Dad Shamaria Kavan, MSW, LCSW-A [facility]  [phone]

## 2018-07-22 NOTE — Progress Notes (Signed)
Christina Morton [id number] Postpartum Day 1 S/P Repeat Cesarean Section due to patient desire with completion of BTL  Subjective: Patient up ad lib, denies syncope or dizziness. Reports consuming regular diet without issues and denies N/V. Patient reports no bowel movement and is not passing flatus, but states "it's right there."  Denies issues with urination and reports bleeding is "light actually."  Patient is bottlefeeding anddenies issues with her breast.  Pain is being appropriately managed with use of ibuprofen and tylenol as patient reports more soreness than anything. Patient's mom in room and reports that patient showing signs of depression.  Patient reports feeling "alright for now," but does admit to skin picking as disclosed by her mother.  Mother expresses concern for patient regarding her history of depression and mood stability.  Patient is open to starting an antidepressant today.   Objective: Temp:  [97.6 F (36.4 C)-98.8 F (37.1 C)] 98.8 F (37.1 C) (04/02 1005) Pulse Rate:  [67-91] 89 (04/02 1005) Resp:  [12-22] 18 (04/02 1005) BP: (95-135)/(56-88) 95/57 (04/02 1005) SpO2:  [99 %-100 %] 100 % (04/02 0218)  Recent Labs    07/21/18 0720 07/21/18 1535 07/22/18 0545  HGB 9.8* 8.7* 8.1*  HCT 34.2* 29.2* 26.9*  WBC 8.4 11.1* 8.7    Physical Exam:  General: alert, cooperative and no distress Mood/Affect: Appropriate/Congruent Lungs: clear to auscultation, no wheezes, rales or rhonchi, symmetric air entry.  Heart: normal rate and regular rhythm. Breast: breasts appear normal, no suspicious masses, no skin or nipple changes or axillary nodes. Abdomen:  + bowel sounds, Soft, Appropriately Tender, Mild Distention Incision: Pressure dressing reomved, Honeycomb dressing intact-some drainage. Uterine Fundus: firm, U/-2 Lochia: appropriate Skin: Warm, Dry.  Areas of induration c/w picking noted on left side abdomen x 4 in various stages of healing.  Most ~1/2cm in size. DVT  Evaluation: No evidence of DVT seen on physical exam. Calf/Ankle edema is present. JP drain:   None  Assessment Post Operative Day 1 S/P Repeat C/S with BTL Normal Involution BottleFeeding Anemia DM-T2 History of Depression Excoriation Behaviors   Plan: -Discussed initation of Zoloft 50mg  now -Will send email for one week mood check at [facility]. -BS remain normal-continue Metformin as ordered. -Discussed current HgB and will start on Iron supplementation daily.  Given precautions regarding symptomatic anemia. -Discussed ambulation to promote increased bowel function. -Continue other mgmt as ordered. -Discussed possible discharge tomorrow after assessment of behaviors.  [facility] team to be updated on patient status  Cherre Robins MSN, CNM 07/22/2018, 10:39 AM

## 2018-07-22 NOTE — Anesthesia Postprocedure Evaluation (Signed)
Anesthesia Post Note  Patient: Christina Morton  Procedure(s) Performed: CESAREAN SECTION WITH BILATERAL TUBAL LIGATION (Bilateral )     Patient location during evaluation: PACU Anesthesia Type: Spinal Level of consciousness: oriented and awake and alert Pain management: pain level controlled Vital Signs Assessment: post-procedure vital signs reviewed and stable Respiratory status: spontaneous breathing, respiratory function stable and nonlabored ventilation Cardiovascular status: blood pressure returned to baseline and stable Postop Assessment: no headache, no backache, no apparent nausea or vomiting and spinal receding Anesthetic complications: no    Last Vitals:  Vitals:   07/22/18 1005 07/22/18 1421  BP: (!) 95/57 103/66  Pulse: 89 96  Resp: 18 18  Temp: 37.1 C 37.1 C  SpO2:  100%    Last Pain:  Vitals:   07/22/18 1802  TempSrc:   PainSc: 0-No pain   Pain Goal: Patients Stated Pain Goal: 3 (07/21/18 1745)                 Lucretia Kern

## 2018-07-22 NOTE — Transfer of Care (Signed)
Immediate Anesthesia Transfer of Care Note  Patient: Christina Morton  Procedure(s) Performed: CESAREAN SECTION WITH BILATERAL TUBAL LIGATION (Bilateral )  Patient Location: PACU  Anesthesia Type:Spinal  Level of Consciousness: awake, alert  and oriented  Airway & Oxygen Therapy: Patient Spontanous Breathing  Post-op Assessment: Report given to RN and Post -op Vital signs reviewed and stable  Post vital signs: Reviewed and stable  Last Vitals:  Vitals Value Taken Time  BP    Temp    Pulse    Resp    SpO2      Last Pain:  Vitals:   07/22/18 0644  TempSrc: Oral  PainSc: 0-No pain      Patients Stated Pain Goal: 3 (07/21/18 1745)  Complications: No apparent anesthesia complications

## 2018-07-23 LAB — GLUCOSE, RANDOM: Glucose, Bld: 110 mg/dL — ABNORMAL HIGH (ref 70–99)

## 2018-07-23 MED ORDER — OXYCODONE HCL 5 MG PO TABS: 5.0000 mg | ORAL_TABLET | ORAL | 0 refills | Status: DC | PRN

## 2018-07-23 MED ORDER — IBUPROFEN 600 MG PO TABS: 600.0000 mg | ORAL_TABLET | Freq: Four times a day (QID) | ORAL | 0 refills | Status: DC | PRN

## 2018-07-23 MED ORDER — SERTRALINE HCL 50 MG PO TABS: 50.0000 mg | ORAL_TABLET | Freq: Every day | ORAL | 0 refills | Status: DC

## 2018-07-28 ENCOUNTER — Telehealth: Payer: Self-pay | Admitting: Obstetrics & Gynecology

## 2018-07-28 NOTE — Telephone Encounter (Signed)
Placing the patient on the wait list for the upcoming postpartum visit. Scheduled the incision check. No birth control as the patient received a tubal.

## 2018-07-28 NOTE — Telephone Encounter (Signed)
Called the patient to inform of upcoming appointment with Asher Muir. Left the patient a detailed voicemail message.

## 2018-08-02 ENCOUNTER — Ambulatory Visit: Payer: Medicaid Other

## 2018-08-02 NOTE — Clinical Note (Incomplete)
Integrated Behavioral Health Visit via Telemedicine (Telephone)  08/02/2018 Christina Morton [id number]   Session Start time: ***  Session End time: *** Total time: {IBH Total KRCV:81840375}  Referring Provider: *** Type of Visit: Telephonic Patient location: *** [facility] Provider location: *** All persons participating in visit: ***  Confirmed patient's address: {YES/NO:[address]} Confirmed patient's phone number: {YES/NO:21197} Any changes to demographics: {YES/NO:21197}  Confirmed patient's insurance: {YES/NO:21197} Any changes to patient's insurance: {YES/NO:21197}  Discussed confidentiality: {YES/NO:21197}   The following statements were read to the patient and/or legal guardian that are established with the [employer] Provider.  "The purpose of this phone visit is to provide behavioral health care while limiting exposure to the coronavirus (COVID19).  There is a possibility of technology failure and discussed alternative modes of communication if that failure occurs."  "By engaging in this telephone visit, you consent to the provision of healthcare.  Additionally, you authorize for your insurance to be billed for the services provided during this telephone visit."   Patient and/or legal guardian consented to telephone visit: {YES/NO:21197}  PRESENTING CONCERNS: Patient and/or family reports the following symptoms/concerns: *** Duration of problem: ***; Severity of problem: {Mild/Moderate/Severe:20260}  STRENGTHS (Protective Factors/Coping Skills): ***  GOALS ADDRESSED: Patient will: 1.  Reduce symptoms of: {IBH Symptoms:21014056}  2.  Increase knowledge and/or ability of: {IBH Patient Tools:21014057}  3.  Demonstrate ability to: {IBH Goals:21014053}  INTERVENTIONS: Interventions utilized:  {IBH Interventions:21014054} Standardized Assessments completed: {IBH Screening Tools:21014051}  ASSESSMENT: Patient currently experiencing ***.   Patient may benefit from  ***.  PLAN: 1. Follow up with behavioral health clinician on : *** 2. Behavioral recommendations: *** 3. Referral(s): {IBH Referrals:21014055}  [name]

## 2018-08-05 ENCOUNTER — Ambulatory Visit (INDEPENDENT_AMBULATORY_CARE_PROVIDER_SITE_OTHER): Payer: Medicaid Other | Admitting: Clinical

## 2018-08-05 ENCOUNTER — Other Ambulatory Visit: Payer: Self-pay

## 2018-08-05 ENCOUNTER — Ambulatory Visit (INDEPENDENT_AMBULATORY_CARE_PROVIDER_SITE_OTHER): Payer: Medicaid Other | Admitting: *Deleted

## 2018-08-05 VITALS — BP 124/83 | HR 88 | Ht 66.5 in | Wt 236.1 lb

## 2018-08-05 DIAGNOSIS — F331 Major depressive disorder, recurrent, moderate: Secondary | ICD-10-CM

## 2018-08-05 DIAGNOSIS — Z5189 Encounter for other specified aftercare: Secondary | ICD-10-CM

## 2018-08-05 DIAGNOSIS — F4323 Adjustment disorder with mixed anxiety and depressed mood: Secondary | ICD-10-CM

## 2018-08-05 NOTE — Progress Notes (Signed)
I have reviewed this chart and agree with the RN/CMA assessment and management.    K. Meryl Zoa Dowty, M.D. Attending [facility]ulty Practice)   

## 2018-08-05 NOTE — BH Specialist Note (Signed)
Integrated Behavioral Health Follow Up Visit  MRN: [id number] Name: Christina MustacheJasmine Neiswender  Number of Integrated Behavioral Health Clinician visits: 7  Session Start time: 9:20  Session End time: 9:49 Total time: 30 minutes  Type of Service: Integrated Behavioral Health- Individual/Family Interpretor:No. Interpretor Name and Language: n/a  SUBJECTIVE: Christina Morton is a 33 y.o. female accompanied by n/a Patient was referred by Candelaria CelesteJacob Stinson, DO for history of depression at a previous visit. Patient reports the following symptoms/concerns: Pt states her primary concern today is overwhelming fatigue, no energy, oversleeping, depression, crying "3 or 4 times a day", and constant worry about coronavirus exposure. Pt is eating once a day, and lack of appetite the remainder of the day. Taking Zoloft in the morning; did not take this morning.  Duration of problem: Increase postpartum; Severity of problem: moderate  OBJECTIVE: Mood: Depressed and Affect: Depressed and Tearful Risk of harm to self or others: No plan to harm self or others  LIFE CONTEXT: Family and Social: Pt lives with her fiance and children (7yo son and newborn) School/Work: Fiance returns to work at [employer] on Sunday; Pt feels unable to work due to depression and overwhelming fatigue Self-Care: Personal boundaries  Life Changes: Recent childbirth. Loss of family members in the past 5 years (with most recent being loss of 19yo brother in [place]) prior to move to [place]. Sister moved back to [place] after a miscarriage.   GOALS ADDRESSED: Patient will: 1.  Reduce symptoms of: depression  2.  Increase knowledge and/or ability of: healthy habits 3.  Demonstrate ability to: Increase healthy adjustment to current life circumstances, Increase adequate support systems for patient/family and Increase motivation to adhere to plan of care  INTERVENTIONS: Interventions utilized:  Solution-Focused Strategies, Medication Monitoring and Link to  [facility] Standardized Assessments completed: GAD-7 and PHQ 9  ASSESSMENT: Patient currently experiencing Major depressive disorder, current, moderate..   Patient may benefit from continued brief therapeutic interventions regarding coping with depression, along with referral to psychiatry.  PLAN: 1. Follow up with behavioral health clinician on : One week via telehealth (will do CCA at that time) 2. Behavioral recommendations:  -Take Zoloft as prescribed -Continue taking iron pill daily, as recommended by medical provider -Establish care with psychiatrist of choice, as discussed in office visit, for ongoing BH medication management and short-term disability assessment -Register to attend at least one online new mom support group at [email or web address].net 3. Referral(s): [facility] and Psychiatrist 4. "From scale of 1-10, how likely are you to follow plan?": 9  Rae LipsJamie C McMannes, LCSW   Depression screen [facility] 2/9 08/05/2018 08/05/2018 06/21/2018 06/07/2018 04/23/2018  Decreased Interest 2 2 1 2 1   Down, Depressed, Hopeless 3 3 3 3 2   PHQ - 2 Score 5 5 4 5 3   Altered sleeping 3 3 2 3 2   Tired, decreased energy 3 3 2 3 3   Change in appetite 2 2 0 1 0  Feeling bad or failure about yourself  1 1 2 2 2   Trouble concentrating 3 3 0 0 0  Moving slowly or fidgety/restless 0 0 0 0 0  Suicidal thoughts 0 0 0 0 0  PHQ-9 Score 17 17 10 14 10   Difficult doing work/chores - - - - -  Some recent data might be hidden   GAD 7 : Generalized Anxiety Score 08/05/2018 08/05/2018 06/21/2018 06/07/2018  Nervous, Anxious, on Edge 3 3 3 3   Control/stop worrying 2 2 3 2   Worry too  much - different things 3 3 3 3   Trouble relaxing 2 2 2 3   Restless 0 0 0 1  Easily annoyed or irritable 0 0 1 0  Afraid - awful might happen 2 2 3 3   Total GAD 7 Score 12 12 15  15

## 2018-08-05 NOTE — Progress Notes (Signed)
Pt presents today for an incision check.  Pt denies pain today and reports she has removed honeycomb dressing and has someone at home who checks the incision daily.  Incision clean, dry, and well approximated.  No drainage, redness, or other s/s of infection noted.  Pt advised to continue with her normal routine and to notify the office if she notices any s/s of infection.  Pt instructed not to scrub the incision site but to gently cleanse with soap and water and to not put anything on the incision itself.  Pt verbalized understanding.    Pt noted to have elevated PHQ-9 and GAD-7 scores.  Pt agreeable to seeing Integrated Behavioral Health today.

## 2018-08-09 ENCOUNTER — Ambulatory Visit (INDEPENDENT_AMBULATORY_CARE_PROVIDER_SITE_OTHER): Payer: Medicaid Other | Admitting: Clinical

## 2018-08-09 DIAGNOSIS — F331 Major depressive disorder, recurrent, moderate: Secondary | ICD-10-CM | POA: Diagnosis present

## 2018-08-09 NOTE — BH Specialist Note (Signed)
Integrated Behavioral Health Visit via Telemedicine (Telephone)  08/09/2018 Christina Morton [id number]   Session Start time: 2:02  Session End time: 2:09 Total time: 15 minutes  Referring Provider: Leroy Libman, MD Type of Visit: Telephonic Patient location: Home [facility] Provider location: [facility] All persons participating in visit: [facility] and Patient  Confirmed patient's address: Yes  Confirmed patient's phone number: Yes  Any changes to demographics: No   Confirmed patient's insurance: Yes  Any changes to patient's insurance: No   Discussed confidentiality: No  Discussed at previous visit   The following statements were read to the patient and/or legal guardian that are established with the [facility] Provider.  "The purpose of this phone visit is to provide behavioral health care while limiting exposure to the coronavirus (COVID19).  There is a possibility of technology failure and discussed alternative modes of communication if that failure occurs."  "By engaging in this telephone visit, you consent to the provision of healthcare.  Additionally, you authorize for your insurance to be billed for the services provided during this telephone visit."   Patient and/or legal guardian consented to telephone visit: Yes   PRESENTING CONCERNS: Patient and/or family reports the following symptoms/concerns: Pt states her primary concern today is lack of sleep due to baby's sleep schedule. Pt did not check MyChart for Webex instructions and is currently attempting to nap before baby awakens. Pt is waiting for call-back from psychiatry; if she does not hear back, will do walk-in via [facility] to establish with psychiatry this week. Pt agrees to follow-up call from [facility] in one week to determine whether or not she has first appointment with psychiatry scheduled.  Duration of problem: Increase postpartum; Severity of problem: moderate  STRENGTHS (Protective Factors/Coping Skills): Actively seeking care;  taking medication as prescribed  GOALS ADDRESSED: Patient will: 1.  Reduce symptoms of: anxiety and depression 2.  Demonstrate ability to: Increase motivation to adhere to plan of care  INTERVENTIONS: Interventions utilized:  Supportive Counseling and Medication Monitoring Standardized Assessments completed: Not given today  ASSESSMENT: Patient currently experiencing Major depressive disorder, moderate, recurrent.   Patient may benefit from continued brief therapeutic interventions regarding coping with symptoms of depression and anxiety.  PLAN: 1. Follow up with behavioral health clinician on : One week for brief phone follow-up  2. Behavioral recommendations:  -Continue taking daily naps as needed; sleep when baby sleeps -Continue taking medication as prescribed -Continue with plan to establish care with psychiatry this week 3. Referral(s): Integrated [facility] (In Clinic) and Psychiatrist  Valetta Close [facility]

## 2018-08-10 ENCOUNTER — Other Ambulatory Visit: Payer: Self-pay | Admitting: Obstetrics and Gynecology

## 2018-08-10 ENCOUNTER — Ambulatory Visit: Payer: Self-pay

## 2018-08-10 MED ORDER — SERTRALINE HCL 50 MG PO TABS: 75.0000 mg | ORAL_TABLET | Freq: Every day | ORAL | 0 refills | Status: DC

## 2018-08-10 NOTE — Progress Notes (Signed)
Increased zoloft to 75 mg daily

## 2018-08-17 ENCOUNTER — Ambulatory Visit: Payer: Medicaid Other | Admitting: Clinical

## 2018-08-17 ENCOUNTER — Other Ambulatory Visit: Payer: Self-pay

## 2018-08-17 DIAGNOSIS — F331 Major depressive disorder, recurrent, moderate: Secondary | ICD-10-CM

## 2018-08-17 NOTE — BH Specialist Note (Signed)
Pt did not arrive via WebEx video; pt did answer the phone. Pt is waiting to hear back from [facility] of the [place] to begin services for ongoing therapy.   MRN: [id number] Name: Christina Morton   Less than 5 minutes via phone visit, 10:36-10:40   [facility]-Behavioral Health Clinician

## 2018-08-20 ENCOUNTER — Telehealth: Payer: Self-pay | Admitting: Obstetrics and Gynecology

## 2018-08-20 NOTE — Telephone Encounter (Signed)
Called the patient, left a detailed voicemail concerning the [employer] webex app and upcoming virtual appointment.

## 2018-08-23 ENCOUNTER — Ambulatory Visit (INDEPENDENT_AMBULATORY_CARE_PROVIDER_SITE_OTHER): Payer: Medicaid Other | Admitting: Obstetrics and Gynecology

## 2018-08-23 ENCOUNTER — Encounter (INDEPENDENT_AMBULATORY_CARE_PROVIDER_SITE_OTHER): Payer: Medicaid Other | Admitting: Ophthalmology

## 2018-08-23 ENCOUNTER — Telehealth: Payer: Self-pay | Admitting: Obstetrics and Gynecology

## 2018-08-23 ENCOUNTER — Other Ambulatory Visit: Payer: Self-pay

## 2018-08-23 DIAGNOSIS — H33303 Unspecified retinal break, bilateral: Secondary | ICD-10-CM

## 2018-08-23 DIAGNOSIS — H35413 Lattice degeneration of retina, bilateral: Secondary | ICD-10-CM

## 2018-08-23 DIAGNOSIS — H35033 Hypertensive retinopathy, bilateral: Secondary | ICD-10-CM | POA: Diagnosis not present

## 2018-08-23 DIAGNOSIS — I10 Essential (primary) hypertension: Secondary | ICD-10-CM | POA: Diagnosis not present

## 2018-08-23 DIAGNOSIS — H2513 Age-related nuclear cataract, bilateral: Secondary | ICD-10-CM

## 2018-08-23 NOTE — Telephone Encounter (Signed)
The patient came in to the office stating she was unaware of the virtual visit. She stated how is the doctor going to check my incision over the phone. Informed of the evaluation of the chart and how the visits are decided of face to face or virtual. She then stated she would like to have an IUD. Informed the nurse Toni Amend) of the patient's statement. The visit was changed from virtual to face to face.

## 2018-08-23 NOTE — Progress Notes (Signed)
Opened in error

## 2018-08-23 NOTE — Progress Notes (Signed)
Obstetrics and Gynecology Postpartum Visit  Appointment Date: 08/24/2018  OBGYN Clinic: [facility]  Primary Care Provider: Shirley, [place]  Chief Complaint:  Chief Complaint  Patient presents with  . Postpartum Care    History of Present Illness: Christina Morton is a 33 y.o. African-American [id number] (No LMP recorded.), seen for the above chief complaint. Her past medical history is significant for h/o c/s, anx/dep, dm2  She is s/p rpt c/s and BTL on 4/1; she was discharged to home on POD#2  Vaginal bleeding or discharge: No  Breast or formula feeding: formula Intercourse: No  Contraception after delivery: abstinence PP depression s/s: No  Any bowel or bladder issues: No  Pap smear: no abnormalities (date: 2019)  Review of Systems: as noted in the History of Present Illness.  Patient Active Problem List   Diagnosis Date Noted  . 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 History of classical cesarean section 07/21/2018  . Unwanted fertility 07/21/2018  . Status post repeat low transverse cesarean section 07/21/2018  . Obesity in pregnancy 05/24/2018  . Weight loss 04/23/2018  . Depression 03/23/2018  . Glaucoma 03/23/2018  . Supervision of high risk pregnancy, antepartum 01/21/2018  . History of cesarean section, classical 01/21/2018  . 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 Family history of heart disease 01/21/2018  . Diabetes mellitus complicating pregnancy, antepartum 01/21/2018    Medications Sherrie Mustache had no medications administered during this visit. Current Outpatient Medications  Medication Sig Dispense Refill  . ibuprofen (ADVIL,MOTRIN) 600 MG tablet Take 1 tablet (600 mg total) by mouth every 6 (six) hours as needed for fever or headache. 30 tablet 0  . sertraline (ZOLOFT) 50 MG tablet Take 1.5 tablets (75 mg total) by mouth daily. 30 tablet 0   No current facility-administered medications for this visit.     Allergies Amoxicillin  and Penicillins  Physical Exam:  BP 114/71   Pulse 96   Wt 238 lb 1.6 oz (108 kg)   BMI 37.85 kg/m  Body mass index is 37.85 kg/m. General appearance: Well nourished, well developed female in no acute distress.  Respiratory:  Normal respiratory effort Abdomen: +BS, soft, nttp, nd, well healed low transverse skin incision Neuro/Psych:  Normal mood and affect.  Skin:  Warm and dry.   Laboratory: none  PP Depression Screening:  EPDS score 10, no thoughts of harm  Assessment: pt doing well  Plan:  *No period yet; pt told to expect one in the next few weeks *Patient would like LNG IUD to help with her periods. She states that some months were bad and some weren't and they were always qmonth and regular. I told her that menses can change after a child and to wait 61m and if she'd like one to let us know. Pt amenable to plan *Doing well on sertraline. Continue current regimen *Patient to continue on pre pregnancy metformin. Pt told to call pcp for regular check for sometime in June and to check CBGs during the day  RTC 71m, PRN  [place] Bing, Montez Hageman MD Attending [facility])

## 2018-08-24 ENCOUNTER — Encounter: Payer: Self-pay | Admitting: Obstetrics and Gynecology

## 2018-08-26 ENCOUNTER — Encounter: Payer: Self-pay | Admitting: Family Medicine

## 2018-08-26 ENCOUNTER — Other Ambulatory Visit: Payer: Self-pay | Admitting: Obstetrics and Gynecology

## 2018-08-26 DIAGNOSIS — O24919 Unspecified diabetes mellitus in pregnancy, unspecified trimester: Secondary | ICD-10-CM

## 2018-08-26 MED ORDER — METFORMIN HCL 1000 MG PO TABS: 1000.0000 mg | ORAL_TABLET | Freq: Two times a day (BID) | ORAL | 1 refills | Status: DC

## 2018-08-30 ENCOUNTER — Other Ambulatory Visit: Payer: Self-pay

## 2018-08-30 ENCOUNTER — Encounter (INDEPENDENT_AMBULATORY_CARE_PROVIDER_SITE_OTHER): Payer: Medicaid Other | Admitting: Ophthalmology

## 2018-08-30 DIAGNOSIS — H33301 Unspecified retinal break, right eye: Secondary | ICD-10-CM | POA: Diagnosis not present

## 2018-09-15 ENCOUNTER — Encounter (INDEPENDENT_AMBULATORY_CARE_PROVIDER_SITE_OTHER): Payer: Medicaid Other | Admitting: Ophthalmology

## 2018-09-16 ENCOUNTER — Encounter (INDEPENDENT_AMBULATORY_CARE_PROVIDER_SITE_OTHER): Payer: Medicaid Other | Admitting: Ophthalmology

## 2018-09-17 ENCOUNTER — Ambulatory Visit: Payer: Medicaid Other | Admitting: Family Medicine

## 2018-09-21 ENCOUNTER — Other Ambulatory Visit: Payer: Self-pay | Admitting: Ophthalmology

## 2018-09-21 DIAGNOSIS — T380X5A Adverse effect of glucocorticoids and synthetic analogues, initial encounter: Secondary | ICD-10-CM

## 2018-09-21 DIAGNOSIS — H40003 Preglaucoma, unspecified, bilateral: Secondary | ICD-10-CM

## 2018-09-21 DIAGNOSIS — R6889 Other general symptoms and signs: Secondary | ICD-10-CM

## 2018-09-22 ENCOUNTER — Other Ambulatory Visit: Payer: Self-pay

## 2018-09-22 ENCOUNTER — Encounter: Payer: Self-pay | Admitting: Family Medicine

## 2018-09-22 ENCOUNTER — Ambulatory Visit (INDEPENDENT_AMBULATORY_CARE_PROVIDER_SITE_OTHER): Payer: Medicaid Other | Admitting: Family Medicine

## 2018-09-22 VITALS — BP 120/62 | HR 99 | Ht 67.0 in | Wt 238.5 lb

## 2018-09-22 DIAGNOSIS — E669 Obesity, unspecified: Secondary | ICD-10-CM | POA: Diagnosis not present

## 2018-09-22 DIAGNOSIS — E1169 Type 2 diabetes mellitus with other specified complication: Secondary | ICD-10-CM

## 2018-09-22 DIAGNOSIS — F3289 Other specified depressive episodes: Secondary | ICD-10-CM | POA: Diagnosis not present

## 2018-09-22 LAB — POCT GLYCOSYLATED HEMOGLOBIN (HGB A1C): HbA1c, POC (prediabetic range): 5.7 % (ref 5.7–6.4)

## 2018-09-22 NOTE — Assessment & Plan Note (Signed)
Patient reports that she currently follows with a psychiatrist.  States that she saw her psychiatrist on Monday who stated that she believes she would be okay to trial off of Zoloft given that patient has not noted any benefit however stated that she should ask her PCP if this is okay.  After long discussion with patient we have decided that she may trial off of Zoloft as she does not believe it is helping her very much and states that it has affected her sleep. -Patient encouraged to follow-up sooner than 3 months if she begins feeling any symptoms of depression.  She was instructed to contact our office or 911 if she has any thoughts of hurting herself or others.

## 2018-09-22 NOTE — Assessment & Plan Note (Signed)
Patient on metformin 2000 mg daily and is requesting trial off of medication.  A1c 5.7 today which is well controlled.  Agree that she may trial off of medication although counseled that there is benefit with metformin.  Patient will check her CBGs weekly and will return in 3 months for a recheck in order to determine if she may remain diet controlled without metformin.  Encouraged her to continue exercising and counseled on dietary changes and given handout.

## 2018-09-22 NOTE — Patient Instructions (Addendum)
Thank you for coming to see me today. It was a pleasure! Today we talked about:   Your diabetes.  Your A1c today is 5.7 which is well controlled, so we can trial off of the metformin.  I do suggest that you continue checking your blood glucose at least weekly.  I encourage you to try walking daily as we discussed in the office as well as trying to make that diet adjustments that we discussed.  You may also stop the Zoloft as you do not believe that it is helping.  As we discussed in the office if you have any troubles or thoughts of hurting yourself or others and please do not hesitate to call the hospital or come into the clinic.  Please follow-up with me in 3 months or sooner as needed.  If you have any questions or concerns, please do not hesitate to call the office at [phone].  Take Care,   [place] Thales Knipple, DO   Diet Recommendations for Diabetes  Carbohydrate includes starch, sugar, and fiber.  Of these, only sugar and starch raise blood glucose.  (Fiber is found in fruits, vegetables [especially skin, seeds, and stalks] and whole grains.)   Starchy (carb) foods: Bread, rice, pasta, potatoes, corn, cereal, grits, crackers, bagels, muffins, all baked goods.  (Fruit, milk, and yogurt also have carbohydrate, but most of these foods will not spike your blood sugar as most starchy foods will.)  A few fruits do cause high blood sugars; use small portions of bananas (limit to 1/2 at a time), grapes, watermelon, oranges, and most tropical fruits.   Protein foods: Meat, fish, poultry, eggs, dairy foods, and beans such as pinto and kidney beans (beans also provide carbohydrate).   1. Eat at least REAL 3 meals and 1-2 snacks per day. Never go more than 4-5 hours while awake without eating. Eat breakfast within the first hour of getting up.   2. Limit starchy foods to TWO per meal and ONE per snack. ONE portion of a starchy  food is equal to the following:   - ONE slice of bread (or its  equivalent, such as half of a hamburger bun).   - 1/2 cup of a "scoopable" starchy food such as potatoes or rice.   - 15 grams of Total Carbohydrate as shown on food label.  3. Include at every meal: a protein food, a carb food, and vegetables and/or fruit.   - Obtain twice the volume of veg's as protein or carbohydrate foods for both lunch and dinner.   - Fresh or frozen veg's are best.   - Keep frozen veg's on hand for a quick vegetable serving.

## 2018-09-22 NOTE — Progress Notes (Signed)
Subjective:  Patient ID: Christina Morton  DOB: 1986-01-28 MRN: [id number]  Shilynn Rascher is a 33 y.o. female with a PMH of Diabetes, glaucoma, depression, skin-picking disorder, here today for f/u diabetes.   HPI:  Diabetes: Disease Monitoring: - Blood Sugar ranges-70's-120's - Medications: metformin, Compliant: yes  - Not on Aspirin, and on statin - Last eye exam: just had laser surgery May 4, May 11, has glaucoma - Last foot exam: performed today - Diet: Counseled patient on importance of limiting starches as she did not know that potatoes were not good for people with diabetes - Exercise: Patient starting to walk twice per day. ROS: denies hypoglycemic sx, dizziness, diaphoresis, LOC, polyuria, polydipsia  Monitoring Labs and Parameters - Last A1C:  Lab Results  Component Value Date   HGBA1C 5.7 09/22/2018    Depression: - Medications: zoloft 75mg   - Compliance: yes, but patient would like to stop - Denies any SOB, CP, vision changes, LE edema, medication SEs, or symptoms of hypotension  ROS: as mentioned in HPI  Family hx: Cardiovascular disease, Diabetes   Social hx: Denies use of illicit drugs, alcohol use Smoking status reviewed  Patient Active Problem List   Diagnosis Date Noted  . 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 Depression 03/23/2018  . Glaucoma 03/23/2018  . Family history of heart disease 01/21/2018  . Diabetes mellitus type 2 in obese (HCC) 01/21/2018     Objective:  BP 120/62   Pulse 99   Ht 5\' 7"  (1.702 m)   Wt 238 lb 8 oz (108.2 kg)   SpO2 99%   BMI 37.35 kg/m   Vitals and nursing note reviewed  General: NAD, pleasant Cardiac: RRR, normal heart sounds, no m/r/g Pulm: normal effort, CTAB Extremities: no edema or cyanosis. WWP. Skin: warm and dry, no rashes noted Neuro: alert and oriented, no focal deficits Psych: normal affect, normal thought content  Diabetic Foot Exam - Simple   Simple Foot Form Visual Inspection No  deformities, no ulcerations, no other skin breakdown bilaterally:  Yes Sensation Testing See comments:  Yes Pulse Check Posterior Tibialis and Dorsalis pulse intact bilaterally:  Yes Comments Intact to touch. Multiple well-healing excoriations secondary to patient's known skin-picking disorder     Assessment & Plan:   Diabetes mellitus type 2 in obese [facility]) Patient on metformin 2000 mg daily and is requesting trial off of medication.  A1c 5.7 today which is well controlled.  Agree that she may trial off of medication although counseled that there is benefit with metformin.  Patient will check her CBGs weekly and will return in 3 months for a recheck in order to determine if she may remain diet controlled without metformin.  Encouraged her to continue exercising and counseled on dietary changes and given handout.  Depression Patient reports that she currently follows with a psychiatrist.  States that she saw her psychiatrist on Monday who stated that she believes she would be okay to trial off of Zoloft given that patient has not noted any benefit however stated that she should ask her PCP if this is okay.  After long discussion with patient we have decided that she may trial off of Zoloft as she does not believe it is helping her very much and states that it has affected her sleep. -Patient encouraged to follow-up sooner than 3 months if she begins feeling any symptoms of depression.  She was instructed to contact our office or 911 if she has any thoughts of hurting herself or others.  Health Maintenance: Offer pneumonia vaccine at next visit, as well as obtain urine microalbumin- patient brought baby in today and did not have time   [place] Victory Strollo, DO Family Medicine Resident PGY-2

## 2018-09-30 ENCOUNTER — Encounter: Payer: Self-pay | Admitting: Family Medicine

## 2018-09-30 MED ORDER — FAMOTIDINE 20 MG PO TABS: 20.0000 mg | ORAL_TABLET | Freq: Every day | ORAL | 1 refills | Status: DC

## 2018-10-07 ENCOUNTER — Encounter (INDEPENDENT_AMBULATORY_CARE_PROVIDER_SITE_OTHER): Payer: Medicaid Other | Admitting: Ophthalmology

## 2018-10-07 ENCOUNTER — Other Ambulatory Visit: Payer: Self-pay

## 2018-10-07 DIAGNOSIS — H33303 Unspecified retinal break, bilateral: Secondary | ICD-10-CM

## 2018-10-12 ENCOUNTER — Other Ambulatory Visit: Payer: Self-pay

## 2018-10-19 ENCOUNTER — Other Ambulatory Visit: Payer: Medicaid Other

## 2018-10-19 ENCOUNTER — Other Ambulatory Visit: Payer: Self-pay

## 2018-10-19 ENCOUNTER — Ambulatory Visit
Admission: RE | Admit: 2018-10-19 | Discharge: 2018-10-19 | Disposition: A | Discharge status: Home / Self Care | Payer: Medicaid Other | Source: Ambulatory Visit | Attending: Ophthalmology | Admitting: Ophthalmology

## 2018-10-19 DIAGNOSIS — H40003 Preglaucoma, unspecified, bilateral: Secondary | ICD-10-CM

## 2018-10-19 DIAGNOSIS — T380X5A Adverse effect of glucocorticoids and synthetic analogues, initial encounter: Secondary | ICD-10-CM

## 2018-10-19 DIAGNOSIS — R6889 Other general symptoms and signs: Secondary | ICD-10-CM

## 2018-10-19 MED ORDER — GADOBENATE DIMEGLUMINE 529 MG/ML IV SOLN
20.0000 mL | Freq: Once | INTRAVENOUS | Status: AC | PRN
  Administered 2018-10-19: 20 mL via INTRAVENOUS

## 2018-11-09 ENCOUNTER — Telehealth: Payer: Self-pay | Admitting: Obstetrics & Gynecology

## 2018-11-09 NOTE — Telephone Encounter (Signed)
Spoke to patient about her appointment on 7/22 @ 3:55. Patient instructed to wear a face mask for the entire appointment and no visitors are allowed with her during the visit. Patient screened for covid symptoms and denied having any

## 2018-11-10 ENCOUNTER — Ambulatory Visit (INDEPENDENT_AMBULATORY_CARE_PROVIDER_SITE_OTHER): Payer: Medicaid Other | Admitting: Obstetrics & Gynecology

## 2018-11-10 ENCOUNTER — Other Ambulatory Visit: Payer: Self-pay

## 2018-11-10 ENCOUNTER — Encounter: Payer: Self-pay | Admitting: Obstetrics & Gynecology

## 2018-11-10 VITALS — BP 129/84 | HR 101 | Wt 236.8 lb

## 2018-11-10 DIAGNOSIS — N938 Other specified abnormal uterine and vaginal bleeding: Secondary | ICD-10-CM | POA: Diagnosis not present

## 2018-11-10 DIAGNOSIS — Z3040 Encounter for surveillance of contraceptives, unspecified: Secondary | ICD-10-CM

## 2018-11-10 MED ORDER — LEVONORGESTREL 20 MCG/24HR IU IUD
INTRAUTERINE_SYSTEM | Freq: Once | INTRAUTERINE | Status: AC
  Administered 2018-11-10: 17:00:00 via INTRAUTERINE

## 2018-11-11 NOTE — Progress Notes (Signed)
   Subjective:    Patient ID: Christina Morton, female    DOB: 03-Dec-1985, 33 y.o.   MRN: [id number]  HPI 33 yo P2 here for insertion of IUD. This is being done for DUB. She had seen Dr. Ilda Basset 08/23/18 and discussed this with him.   Review of Systems Pap normal 01/07/18 She had a BTL at the time of her recent C/S 07/21/18.      Objective:   Physical Exam Breathing, conversing, and ambulating normally Well nourished, well hydrated Black female, no apparent distress UPT negative, consent signed, Time out procedure done. Cervix prepped with betadine and grasped with a single tooth tenaculum. Mirena was easily placed and the strings were cut to 3-4 cm. Uterus sounded to 9 cm. She tolerated the procedure well.     Assessment & Plan:  DUB- trial of Mirena

## 2018-11-30 ENCOUNTER — Encounter: Payer: Self-pay | Admitting: *Deleted

## 2018-11-30 ENCOUNTER — Other Ambulatory Visit (HOSPITAL_COMMUNITY): Payer: Self-pay | Admitting: Maternal & Fetal Medicine

## 2018-11-30 ENCOUNTER — Encounter: Payer: Self-pay | Admitting: Family Medicine

## 2018-11-30 DIAGNOSIS — O24119 Pre-existing diabetes mellitus, type 2, in pregnancy, unspecified trimester: Secondary | ICD-10-CM

## 2018-12-01 MED ORDER — FAMOTIDINE 20 MG PO TABS: 20.0000 mg | ORAL_TABLET | Freq: Every day | ORAL | 1 refills | Status: DC

## 2018-12-03 ENCOUNTER — Other Ambulatory Visit: Payer: Self-pay

## 2018-12-03 ENCOUNTER — Encounter (HOSPITAL_COMMUNITY): Payer: Self-pay | Admitting: Urgent Care

## 2018-12-03 ENCOUNTER — Ambulatory Visit (HOSPITAL_COMMUNITY)
Admission: EM | Admit: 2018-12-03 | Discharge: 2018-12-03 | Disposition: A | Discharge status: Home / Self Care | Payer: Medicaid Other | Attending: Urgent Care | Admitting: Urgent Care

## 2018-12-03 DIAGNOSIS — M5412 Radiculopathy, cervical region: Secondary | ICD-10-CM | POA: Diagnosis not present

## 2018-12-03 DIAGNOSIS — R202 Paresthesia of skin: Secondary | ICD-10-CM

## 2018-12-03 DIAGNOSIS — M542 Cervicalgia: Secondary | ICD-10-CM

## 2018-12-03 DIAGNOSIS — R200 Anesthesia of skin: Secondary | ICD-10-CM

## 2018-12-03 MED ORDER — CYCLOBENZAPRINE HCL 5 MG PO TABS: 5.0000 mg | ORAL_TABLET | Freq: Three times a day (TID) | ORAL | 0 refills | Status: DC | PRN

## 2018-12-03 MED ORDER — PREDNISONE 20 MG PO TABS: ORAL_TABLET | ORAL | 0 refills | Status: DC

## 2018-12-03 NOTE — ED Triage Notes (Signed)
PT reports neck pain on left side with "heaviness" in shoulder and pain in shoulder that shoots down to fingers. PT reports pain is positional and gets worse with movements.

## 2018-12-03 NOTE — ED Provider Notes (Signed)
MRN: [id number] DOB: 04-09-1986  Subjective:   Christina Morton is a 33 y.o. female presenting for several day history of acute onset worsening constant left-sided neck pain that radiates down her trapezius into the left shoulder all the way down to her fingers.  She is also now getting numbness and tingling of her left arm including her fingers.  Patient states that the pain is much worse when she tries to use her arm lifted above shoulder height and also with moving her neck.  Denies any falls, trauma, history of neck or bone issues.  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  No particular inciting event with them.   No current facility-administered medications for this encounter.   Current Outpatient Medications:  .  famotidine (PEPCID) 20 MG tablet, Take 1 tablet (20 mg total) by mouth at bedtime., Disp: 30 tablet, Rfl: 1    Allergies  Allergen Reactions  . Amoxicillin Anaphylaxis    Did it involve swelling of the face/tongue/throat, SOB, or low BP? Yes Did it involve sudden or severe rash/hives, skin peeling, or any reaction on the inside of your mouth or nose? No Did you need to seek medical attention at a hospital or doctor's office? Yes When did it last happen?childhood allergy If all above answers are "NO", may proceed with cephalosporin use.  [name] Penicillins Anaphylaxis    Did it involve swelling of the face/tongue/throat, SOB, or low BP? Yes Did it involve sudden or severe rash/hives, skin peeling, or any reaction on the inside of your mouth or nose? No Did you need to seek medical attention at a hospital or doctor's office? Yes When did it last happen?childhood allergy If all above answers are "NO", may proceed with cephalosporin use.     Past Medical History:  Diagnosis Date  . Anemia   . Depression   . Glaucoma   . History of cesarean section, classical 01/21/2018   Low transverse with 5 cm extension into  muscular layer--was told at [facility] risk of rupture--will treat as prior classical  . Mental disorder   . Type 2 diabetes mellitus without complications ([place])   . 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 CESAREAN SECTION    . CESAREAN SECTION WITH BILATERAL TUBAL LIGATION Bilateral 07/21/2018   Procedure: CESAREAN SECTION WITH BILATERAL TUBAL LIGATION;  Surgeon: Gwynne Edinger, MD;  Location: [facility] LD ORS;  Service: Obstetrics;  Laterality: Bilateral;  . CYSTECTOMY      ROS  Objective:   Vitals: BP 120/77 (BP Location: Right Arm)   Pulse 88   Temp 98.3 F (36.8 C) (Oral)   Resp 16   LMP 11/14/2018   SpO2 97%   Physical Exam Constitutional:      General: She is not in acute distress.    Appearance: Normal appearance. She is well-developed. She is not ill-appearing.  HENT:     Head: Normocephalic and atraumatic.     Nose: Nose normal.     Mouth/Throat:     Mouth: Mucous membranes are moist.     Pharynx: Oropharynx is clear.  Eyes:     General: No scleral icterus.    Extraocular Movements: Extraocular movements intact.     Pupils: Pupils are equal, round, and reactive to light.  Cardiovascular:     Rate and Rhythm: Normal rate.  Pulmonary:  Effort: Pulmonary effort is normal.  Musculoskeletal:     Cervical back: She exhibits decreased range of motion, tenderness and spasm. She exhibits no bony tenderness, no swelling, no edema, no deformity and no laceration.       Back:  Skin:    General: Skin is warm and dry.  Neurological:     General: No focal deficit present.     Mental Status: She is alert and oriented to person, place, and time.  Psychiatric:        Mood and Affect: Mood normal.        Behavior: Behavior normal.     Assessment and Plan :   1. Cervical radiculopathy   2. Neck pain   3. Left arm numbness   4. Numbness and tingling in left arm     We will  treat patient with high-dose steroids given her severe pain and cervical radiculopathy symptoms.  Counseled patient that I need her to work with her PCP on obtaining an MRI.  Will use Flexeril in the meantime.  Her A1c in June was less than 6% but patient will monitor blood sugar at home regardless. Counseled patient on potential for adverse effects with medications prescribed/recommended today, ER and return-to-clinic precautions discussed, patient verbalized understanding.    Wallis BambergMani, Christina Morton, [place]PA-C 12/03/18 1651

## 2018-12-06 ENCOUNTER — Encounter: Payer: Self-pay | Admitting: Family Medicine

## 2018-12-08 ENCOUNTER — Other Ambulatory Visit: Payer: Self-pay | Admitting: Family Medicine

## 2018-12-08 ENCOUNTER — Other Ambulatory Visit: Payer: Self-pay

## 2018-12-08 ENCOUNTER — Encounter: Payer: Self-pay | Admitting: Family Medicine

## 2018-12-08 ENCOUNTER — Ambulatory Visit: Payer: Medicaid Other | Admitting: Family Medicine

## 2018-12-08 VITALS — BP 120/60 | Ht 67.0 in | Wt 239.2 lb

## 2018-12-08 DIAGNOSIS — M5412 Radiculopathy, cervical region: Secondary | ICD-10-CM

## 2018-12-08 DIAGNOSIS — M542 Cervicalgia: Secondary | ICD-10-CM

## 2018-12-08 MED ORDER — FAMOTIDINE 20 MG PO TABS: 20.0000 mg | ORAL_TABLET | Freq: Every day | ORAL | 1 refills | Status: AC

## 2018-12-08 MED ORDER — TRAMADOL HCL 50 MG PO TABS: 50.0000 mg | ORAL_TABLET | Freq: Three times a day (TID) | ORAL | 0 refills | Status: AC | PRN

## 2018-12-08 NOTE — Progress Notes (Signed)
    Subjective:  Christina Morton is a 33 y.o. female who presents to the [facility] today with a chief complaint of neck pain.   HPI: Christina Morton complains of neck pain since 11/30/18 that began while driving. Patient seen in ED for similar complaints on 12/03/18 and dx with cervical radiculopathy and treated with high dose steroids and flexeril. Patient as also tried heat, lidocaine patches and massage without relief.  She reports no improvement in her neck pain since starting steroids and taking flexeril.  Endorses paraesthesias, numbness and tingling in upper extremity, right shoulder pain and tingling.  Pain is worse with movement of left arm especially with lifting. Denies hand pain, weakness, fever, back pain, headaches and rash.    PMHx: T2DM. Depression, Anemia, Glaucoma,  Surg Hx: Urachal cyst removal, C-section  Objective:  Physical Exam: BP 120/60   Ht 5\' 7"  (1.702 m)   Wt 239 lb 4 oz (108.5 kg)   LMP 11/14/2018   BMI 37.47 kg/m    GEN:     alert, cooperative and uncomfortable EYES:   pupils equal and reactive, EOM intact NECK:  Normal flexion and right rotation, decreased extension and left rotation due to pain, TTP left neck, no lymphadenopathy, no midline bony tenderness, no bony abnormalities   RESP:  clear to auscultation bilaterally, no increased work of breathing  CVS:   regular rate and rhythm, no murmur, distal pulses intact   EXT:   normal ROM left LE, right LE: atraumatic, no edema, no erythema, limited abduction, flexion and extension due pain, no bony abnormalities  NEURO:  Gross sensation intact, strength 5/5 RUE and LUE Skin:   warm and dry    No results found for this or any previous visit (from the past 72 hour(s)).   Assessment/Plan:  Cervical radiculopathy Patient without relief with high dose steroids and flexeril.  Exam limited due to pain even with mild palpation.  Will obtain an cervical MRI.  Prescribed 50 mg Tramadol for pain.     Orders Placed This  Encounter  Procedures  . MR CERVICAL SPINE W CONTRAST    Constant neck pain    Standing Status:   Future    Standing Expiration Date:   02/07/2020    Order Specific Question:   If indicated for the ordered procedure, I authorize the administration of contrast media per Radiology protocol    Answer:   Yes    Order Specific Question:   What is the patient's sedation requirement?    Answer:   Anti-anxiety    Order Specific Question:   Does the patient have a pacemaker or implanted devices?    Answer:   No    Order Specific Question:   Radiology Contrast Protocol - do NOT remove file path    Answer:   [email or web address]    Order Specific Question:   Preferred imaging location?    Answer:   [facility] (table limit-550lbs)    Meds ordered this encounter  Medications  . traMADol (ULTRAM) 50 MG tablet    Sig: Take 1 tablet (50 mg total) by mouth every 8 (eight) hours as needed for up to 7 days.    Dispense:  21 tablet    Refill:  0      Lyndee Hensen, DO PGY-1, [facility] 12/08/2018 12:11 PM

## 2018-12-08 NOTE — Assessment & Plan Note (Signed)
Patient without relief with high dose steroids and flexeril.  Exam limited due to pain even with mild palpation.  Will obtain an cervical MRI.  Prescribed 50 mg Tramadol for pain.

## 2018-12-08 NOTE — Patient Instructions (Addendum)
It was great seeing you today!   I'd like to see you back after your imaging but if you need to be seen earlier than that for any new issues we're happy to fit you in, just give [place] a call!   If you have questions or concerns please do not hesitate to call at [phone].  Cervical Radiculopathy  Cervical radiculopathy means that a nerve in the neck (a cervical nerve) is pinched or bruised. This can happen because of an injury to the cervical spine (vertebrae) in the neck, or as a normal part of getting older. This can cause pain or loss of feeling (numbness) that runs from your neck all the way down to your arm and fingers. Often, this condition gets better with rest. Treatment may be needed if the condition does not get better. What are the causes?  A neck injury.  A bulging disk in your spine.  Muscle movements that you cannot control (muscle spasms).  Tight muscles in your neck due to overuse.  Arthritis.  Breakdown in the bones and joints of the spine (spondylosis) due to getting older.  Bone spurs that form near the nerves in the neck. What are the signs or symptoms?  Pain. The pain may: ? Run from the neck to the arm and hand. ? Be very bad or irritating. ? Be worse when you move your neck.  Loss of feeling or tingling in your arm or hand.  Weakness in your arm or hand, in very bad cases. How is this treated? In many cases, treatment is not needed for this condition. With rest, the condition often gets better over time. If treatment is needed, options may include:  Wearing a soft neck collar (cervical collar) for short periods of time, as told by your doctor.  Doing exercises (physical therapy) to strengthen your neck muscles.  Taking medicines.  Having shots (injections) in your spine, in very bad cases.  Having surgery. This may be needed if other treatments do not help. The type of surgery that is used depends on the cause of your condition. Follow these  instructions at home: If you have a soft neck collar:  Wear it as told by your doctor. Remove it only as told by your doctor.  Ask your doctor if you can remove the collar for cleaning and bathing. If you are allowed to remove the collar for cleaning or bathing: ? Follow instructions from your doctor about how to remove the collar safely. ? Clean the collar by wiping it with mild soap and water and drying it completely. ? Take out any removable pads in the collar every 1-2 days. Wash them by hand with soap and water. Let them air-dry completely before you put them back in the collar. ? Check your skin under the collar for redness or sores. If you see any, tell your doctor. Managing pain      Take over-the-counter and prescription medicines only as told by your doctor.  If told, put ice on the painful area. ? If you have a soft neck collar, remove it as told by your doctor. ? Put ice in a plastic bag. ? Place a towel between your skin and the bag. ? Leave the ice on for 20 minutes, 2-3 times a day.  If using ice does not help, you can try using heat. Use the heat source that your doctor recommends, such as a moist heat pack or a heating pad. ? Place a towel between your  skin and the heat source. ? Leave the heat on for 20-30 minutes. ? Remove the heat if your skin turns bright red. This is very important if you are unable to feel pain, heat, or cold. You may have a greater risk of getting burned.  You may try a gentle neck and shoulder rub (massage). Activity  Rest as needed.  Return to your normal activities as told by your doctor. Ask your doctor what activities are safe for you.  Do exercises as told by your doctor or physical therapist.  Do not lift anything that is heavier than 10 lb (4.5 kg) until your doctor tells you that it is safe. General instructions  Use a flat pillow when you sleep.  Do not drive while wearing a soft neck collar. If you do not have a soft neck  collar, ask your doctor if it is safe to drive while your neck heals.  Ask your doctor if the medicine prescribed to you requires you to avoid driving or using heavy machinery.  Do not use any products that contain nicotine or tobacco, such as cigarettes, e-cigarettes, and chewing tobacco. These can delay healing. If you need help quitting, ask your doctor.  Keep all follow-up visits as told by your doctor. This is important. Contact a doctor if:  Your condition does not get better with treatment. Get help right away if:  Your pain gets worse and is not helped with medicine.  You lose feeling or feel weak in your hand, arm, face, or leg.  You have a high fever.  You have a stiff neck.  You cannot control when you poop or pee (have incontinence).  You have trouble with walking, balance, or talking. Summary  Cervical radiculopathy means that a nerve in the neck is pinched or bruised.  A nerve can get pinched from a bulging disk, arthritis, an injury to the neck, or other causes.  Symptoms include pain, tingling, or loss of feeling that goes from the neck into the arm or hand.  Weakness in your arm or hand can happen in very bad cases.  Treatment may include resting, wearing a soft neck collar, and doing exercises. You might need to take medicines for pain. In very bad cases, shots or surgery may be needed. This information is not intended to replace advice given to you by your health care provider. Make sure you discuss any questions you have with your health care provider. Document Released: 03/27/2011 Document Revised: 02/26/2018 Document Reviewed: 02/26/2018 Elsevier Patient Education  2020 [employer]

## 2018-12-10 ENCOUNTER — Telehealth: Payer: Self-pay

## 2018-12-10 NOTE — Telephone Encounter (Signed)
[facility] called nurse line stating the MRI for patient needs to be reorderd to reflect WO only. Please change so they can schedule patient.

## 2018-12-13 NOTE — Addendum Note (Signed)
Addended by: Lyndee Hensen D on: 12/13/2018 09:06 AM   Modules accepted: Orders

## 2018-12-15 ENCOUNTER — Encounter: Payer: Self-pay | Admitting: Family Medicine

## 2018-12-15 ENCOUNTER — Telehealth: Payer: Self-pay

## 2018-12-15 NOTE — Telephone Encounter (Signed)
Spoke with pt. Informed her that I talked to someone in the MRI department about her appt. The receptionist stated that they will call the pt to make an appt for her. Salvatore Marvel, CMA

## 2018-12-16 ENCOUNTER — Telehealth: Payer: Self-pay | Admitting: Obstetrics & Gynecology

## 2018-12-16 NOTE — Telephone Encounter (Signed)
Spoke to patient about her appointment on 8/28 @ 8:55. Patient instructed to wear a face mask for the entire appointment and no visitors are allowed with her during the visit. Patient screened for covid symptoms and denied having any

## 2018-12-17 ENCOUNTER — Other Ambulatory Visit: Payer: Self-pay

## 2018-12-17 ENCOUNTER — Ambulatory Visit (INDEPENDENT_AMBULATORY_CARE_PROVIDER_SITE_OTHER): Payer: Medicaid Other | Admitting: Advanced Practice Midwife

## 2018-12-17 ENCOUNTER — Encounter: Payer: Self-pay | Admitting: Advanced Practice Midwife

## 2018-12-17 VITALS — BP 112/80 | HR 87 | Temp 98.4°F | Ht 67.0 in | Wt 240.2 lb

## 2018-12-17 DIAGNOSIS — Z30431 Encounter for routine checking of intrauterine contraceptive device: Secondary | ICD-10-CM | POA: Diagnosis not present

## 2018-12-17 NOTE — Progress Notes (Signed)
  GYNECOLOGY CLINIC PROGRESS NOTE  History:  33 y.o. [id number] here today for today for IUD string check; Mirena IUD was placed  7/22. No complaints about the Mirena, no concerning side effects.  The following portions of the patient's history were reviewed and updated as appropriate: allergies, current medications, past family history, past medical history, past social history, past surgical history and problem list. Last pap smear on 12/28/17 was normal.  Review of Systems:  Pertinent items are noted in HPI.   Objective:  Physical Exam Blood pressure 112/80, pulse 87, temperature 98.4 F (36.9 C), height 5\' 7"  (1.702 m), weight 240 lb 3.2 oz (109 kg), unknown if currently breastfeeding. Gen: NAD Abd: Soft, nontender and nondistended Pelvic: Normal appearing external genitalia; normal appearing vaginal mucosa and cervix.  IUD strings visualized,  Curled inside cervical os but easily visible  Assessment & Plan:  Normal IUD check. Patient to keep IUD in place for five years; can come in for removal if she desires pregnancy within the next five years. Routine preventative health maintenance measures emphasized.  Fatima Blank, CNM 9:57 AM

## 2018-12-20 ENCOUNTER — Encounter: Payer: Self-pay | Admitting: Family Medicine

## 2019-01-08 ENCOUNTER — Ambulatory Visit (HOSPITAL_COMMUNITY)
Admission: EM | Admit: 2019-01-08 | Discharge: 2019-01-08 | Disposition: A | Discharge status: Home / Self Care | Payer: Medicaid Other | Attending: Emergency Medicine | Admitting: Emergency Medicine

## 2019-01-08 ENCOUNTER — Ambulatory Visit (INDEPENDENT_AMBULATORY_CARE_PROVIDER_SITE_OTHER): Payer: Medicaid Other

## 2019-01-08 ENCOUNTER — Encounter (HOSPITAL_COMMUNITY): Payer: Self-pay

## 2019-01-08 ENCOUNTER — Other Ambulatory Visit: Payer: Self-pay

## 2019-01-08 DIAGNOSIS — S99921A Unspecified injury of right foot, initial encounter: Secondary | ICD-10-CM

## 2019-01-08 DIAGNOSIS — S92511A Displaced fracture of proximal phalanx of right lesser toe(s), initial encounter for closed fracture: Secondary | ICD-10-CM | POA: Diagnosis not present

## 2019-01-08 MED ORDER — IBUPROFEN 600 MG PO TABS: 600.0000 mg | ORAL_TABLET | Freq: Four times a day (QID) | ORAL | 0 refills | Status: DC | PRN

## 2019-01-08 NOTE — ED Provider Notes (Signed)
HPI  SUBJECTIVE:  Christina Morton is a 33 y.o. female who presents with right little toe pain, swelling, bruising after it was run over by a shopping cart yesterday.  States that she was wearing sandals with a sock.  She describes the pain as constant, throbbing.  She reports that she cannot move the toe is much as the other ones.  No numbness or tingling.  The rest of the foot is fine.  She has tried ice without improvement in her symptoms.  She has not tried any medications.  Symptoms are worse with weightbearing.  She has a past medical history of diabetes, resolved.  No history of neuropathy, hypertension.  LMP: Last month.  Denies the possibility of being pregnant.  JGG:[id number], [place], DO   Past Medical History:  Diagnosis Date  . Anemia   . Depression   . Glaucoma   . History of cesarean section, classical 01/21/2018   Low transverse with 5 cm extension into muscular layer--was told at [facility] risk of rupture--will treat as prior classical  . Mental disorder   . Type 2 diabetes mellitus without complications ([place])   . 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 CESAREAN SECTION    . CESAREAN SECTION WITH BILATERAL TUBAL LIGATION Bilateral 07/21/2018   Procedure: CESAREAN SECTION WITH BILATERAL TUBAL LIGATION;  Surgeon: Gwynne Edinger, MD;  Location: [facility] LD ORS;  Service: Obstetrics;  Laterality: Bilateral;  . CYSTECTOMY      Family History  Problem Relation Age of Onset  . Hypertrophic cardiomyopathy Mother   . Bradycardia Mother   . Healthy Father     Social History   Tobacco Use  . Smoking status: Former Smoker    Packs/day: 1.00    Years: 1.00    Pack years: 1.00    Types: Cigarettes    Quit date: 11/22/2017    Years since quitting: 1.1  . Smokeless tobacco: Never Used  Substance Use Topics  . Alcohol use: Never    Frequency: Never  . Drug use: Never    No current  facility-administered medications for this encounter.   Current Outpatient Medications:  .  famotidine (PEPCID) 20 MG tablet, Take 1 tablet (20 mg total) by mouth at bedtime., Disp: 30 tablet, Rfl: 1 .  FLUoxetine (PROZAC) 10 MG tablet, Take 10 mg by mouth daily., Disp: , Rfl:  .  hydrOXYzine (ATARAX/VISTARIL) 10 MG tablet, Take 10 mg by mouth 3 (three) times daily as needed., Disp: , Rfl:  .  ibuprofen (ADVIL) 600 MG tablet, Take 1 tablet (600 mg total) by mouth every 6 (six) hours as needed., Disp: 30 tablet, Rfl: 0  Allergies  Allergen Reactions  . Amoxicillin Anaphylaxis    Did it involve swelling of the face/tongue/throat, SOB, or low BP? Yes Did it involve sudden or severe rash/hives, skin peeling, or any reaction on the inside of your mouth or nose? No Did you need to seek medical attention at a hospital or doctor's office? Yes When did it last happen?childhood allergy If all above answers are "NO", may proceed with cephalosporin use.  [name] Penicillins Anaphylaxis    Did it involve swelling of the face/tongue/throat, SOB, or low BP? Yes Did it involve sudden or severe rash/hives, skin peeling, or any reaction on the inside of your mouth or nose? No Did you need to seek medical attention at a hospital or doctor's office?  Yes When did it last happen?childhood allergy If all above answers are "NO", may proceed with cephalosporin use.      ROS  As noted in HPI.   Physical Exam  BP 111/60   Pulse 86   Temp 98.7 F (37.1 C)   Resp 18   LMP 12/08/2018   SpO2 100%   Constitutional: Well developed, well nourished, no acute distress Eyes:  EOMI, conjunctiva normal bilaterally HENT: Normocephalic, atraumatic,mucus membranes moist Respiratory: Normal inspiratory effort Cardiovascular: Normal rate GI: nondistended skin: No rash, skin intact Musculoskeletal: Swelling, bruising, tenderness proximal phalanx of right little toe.  Cap refill less than 2 seconds.   Sensation grossly intact.  Decreased range of motion compared to other toes.  Tenderness at the MTP joint.  No tenderness over the distal phalanx.  No other tenderness or evidence of trauma over the rest of the foot. Neurologic: Alert & oriented x 3, no focal neuro deficits Psychiatric: Speech and behavior appropriate   ED Course   Medications - No data to display  Orders Placed This Encounter  Procedures  . DG Foot Complete Right    Standing Status:   Standing    Number of Occurrences:   1    Order Specific Question:   Reason for Exam (SYMPTOM  OR DIAGNOSIS REQUIRED)    Answer:   5th digit pain after injury  . Buddy tape toes    4th and 5th toes    Standing Status:   Standing    Number of Occurrences:   1    Order Specific Question:   Laterality    Answer:   Right  . Post op shoe    Standing Status:   Standing    Number of Occurrences:   1    Order Specific Question:   Laterality    Answer:   Right    No results found for this or any previous visit (from the past 24 hour(s)). Dg Foot Complete Right  Result Date: 01/08/2019 CLINICAL DATA:  Fifth digit pain, injury EXAM: RIGHT FOOT COMPLETE - 3+ VIEW COMPARISON:  None. FINDINGS: There is a minimally displaced, oblique extra-articular fracture of the right fifth proximal phalanx. The joint spaces are well preserved. Soft tissue edema about the fifth digit. IMPRESSION: There is a minimally displaced, oblique extra-articular fracture of the right fifth proximal phalanx. Electronically Signed   By: Lauralyn PrimesAlex  Bibbey M.D.   On: 01/08/2019 18:48    ED Clinical Impression  1. Closed displaced fracture of proximal phalanx of lesser toe of right foot, initial encounter      ED Assessment/Plan  Reviewed imaging independently.  Minimally displaced oblique extra-articular fracture of the right fifth proximal phalanx.  See radiology report for full details.  Buddy taping, stiff soled shoe.  Tylenol/ibuprofen combination.  Follow-up with  [facility] if not better in several weeks.  Advised patient this will take 6 to 8 weeks to fully heal.  Discussed  imaging, MDM, treatment plan, and plan for follow-up with patient.  patient agrees with plan.   Meds ordered this encounter  Medications  . ibuprofen (ADVIL) 600 MG tablet    Sig: Take 1 tablet (600 mg total) by mouth every 6 (six) hours as needed.    Dispense:  30 tablet    Refill:  0    *This clinic note was created using Scientist, clinical (histocompatibility and immunogenetics)Dragon dictation software. Therefore, there may be occasional mistakes despite careful proofreading.   ?    Domenick GongMortenson, Bassem Bernasconi, MD 01/09/19  1017  

## 2019-01-08 NOTE — ED Triage Notes (Signed)
Pt presents with complaints of pain in her right pinky toe. States that yesterday someone ran over it with a shopping cart. Reports no relief with otc treatment.

## 2019-01-08 NOTE — Discharge Instructions (Addendum)
You have a minimally displaced fracture of the right fifth proximal phalanx.  this should heal well on its own with buddy taping and the stiff soled shoe.  Take the ibuprofen with 1 g of Tylenol together 3 or 4 times a day.  This is a very effective combination for pain.  Continue ice, elevation.  Follow-up with [facility] if you are not getting significantly better in several weeks.  This will take 6 to 8 weeks for it to fully heal.  [facility] [address] [place], [place], [place] [address] [phone]

## 2019-01-09 ENCOUNTER — Ambulatory Visit
Admission: RE | Admit: 2019-01-09 | Discharge: 2019-01-09 | Disposition: A | Discharge status: Home / Self Care | Payer: Medicaid Other | Source: Ambulatory Visit | Attending: Family Medicine | Admitting: Family Medicine

## 2019-01-09 DIAGNOSIS — M542 Cervicalgia: Secondary | ICD-10-CM

## 2019-01-12 ENCOUNTER — Other Ambulatory Visit: Payer: Medicaid Other

## 2019-01-18 ENCOUNTER — Other Ambulatory Visit: Payer: Self-pay

## 2019-01-18 ENCOUNTER — Ambulatory Visit (INDEPENDENT_AMBULATORY_CARE_PROVIDER_SITE_OTHER): Payer: Medicaid Other

## 2019-01-18 ENCOUNTER — Ambulatory Visit (INDEPENDENT_AMBULATORY_CARE_PROVIDER_SITE_OTHER): Payer: Medicaid Other | Admitting: Podiatry

## 2019-01-18 ENCOUNTER — Other Ambulatory Visit: Payer: Self-pay | Admitting: Podiatry

## 2019-01-18 DIAGNOSIS — S92511A Displaced fracture of proximal phalanx of right lesser toe(s), initial encounter for closed fracture: Secondary | ICD-10-CM | POA: Diagnosis not present

## 2019-01-18 DIAGNOSIS — M79671 Pain in right foot: Secondary | ICD-10-CM

## 2019-01-18 DIAGNOSIS — S97101A Crushing injury of unspecified right toe(s), initial encounter: Secondary | ICD-10-CM

## 2019-01-18 DIAGNOSIS — S92514A Nondisplaced fracture of proximal phalanx of right lesser toe(s), initial encounter for closed fracture: Secondary | ICD-10-CM | POA: Diagnosis not present

## 2019-01-18 DIAGNOSIS — S92504A Nondisplaced unspecified fracture of right lesser toe(s), initial encounter for closed fracture: Secondary | ICD-10-CM

## 2019-01-18 NOTE — Progress Notes (Signed)
Subjective:   Patient ID: Christina Morton, female   DOB: 33 y.o.   MRN: [id number]   HPI 32 year old female presents the office with concerns of left fifth toe fracture About 3 weeks ago after a shopping cart ran over her foot.  She was seen in urgent care and she is placed into a surgical shoe.  She is having discomfort of the toe.  She has also been buddy splinting the toe.  She is diabetic and her last A1c she fourth was in the fives.  Her last blood sugar she checked was 121.  No history of claudication symptoms ulceration or neuropathy symptoms.  Review of Systems  All other systems reviewed and are negative.  Past Medical History:  Diagnosis Date  . Anemia   . Depression   . Glaucoma   . History of cesarean section, classical 01/21/2018   Low transverse with 5 cm extension into muscular layer--was told at [facility] risk of rupture--will treat as prior classical  . Mental disorder   . Type 2 diabetes mellitus without complications ([facility])   . 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 CESAREAN SECTION    . CESAREAN SECTION WITH BILATERAL TUBAL LIGATION Bilateral 07/21/2018   Procedure: CESAREAN SECTION WITH BILATERAL TUBAL LIGATION;  Surgeon: Kathrynn Running, MD;  Location: [facility] LD [facility]S;  Service: Obstetrics;  Laterality: Bilateral;  . CYSTECTOMY       Current Outpatient Medications:  .  famotidine (PEPCID) 20 MG tablet, Take 1 tablet (20 mg total) by mouth at bedtime., Disp: 30 tablet, Rfl: 1 .  FLUoxetine (PROZAC) 10 MG tablet, Take 10 mg by mouth daily., Disp: , Rfl:  .  hydrOXYzine (ATARAX/VISTARIL) 10 MG tablet, Take 10 mg by mouth 3 (three) times daily as needed., Disp: , Rfl:  .  ibuprofen (ADVIL) 600 MG tablet, Take 1 tablet (600 mg total) by mouth every 6 (six) hours as needed., Disp: 30 tablet, Rfl: 0  Allergies  Allergen Reactions  . Amoxicillin Anaphylaxis    Did it involve  swelling of the face/tongue/throat, SOB, or low BP? Yes Did it involve sudden or severe rash/hives, skin peeling, or any reaction on the inside of your mouth or nose? No Did you need to seek medical attention at a hospital or doctor's office? Yes When did it last happen?childhood allergy If all above answers are "NO", may proceed with cephalosporin use.  [name] Penicillins Anaphylaxis    Did it involve swelling of the face/tongue/throat, SOB, or low BP? Yes Did it involve sudden or severe rash/hives, skin peeling, or any reaction on the inside of your mouth or nose? No Did you need to seek medical attention at a hospital or doctor's office? Yes When did it last happen?childhood allergy If all above answers are "NO", may proceed with cephalosporin use.          Objective:  Physical Exam  General: AAO x3, NAD  Dermatological: Skin is warm, dry and supple bilateral. Nails x 10 are well manicured; remaining integument appears unremarkable at this time. There are no open sores, no preulcerative lesions, no rash or signs of infection present.  Vascular: Dorsalis Pedis artery and Posterior Tibial artery pedal pulses are 2/4 bilateral with immedate capillary fill time. Pedal hair growth present. No varicosities and no lower extremity edema present bilateral. There is no pain with calf compression, swelling, warmth, erythema.   Neruologic: Grossly  intact via light touch bilateral.  Musculoskeletal: Tenderness on the left fifth toe and third toes in rectus position.  Minimal swelling.  No other areas of tenderness to the foot particular on the metatarsals or forefoot area.  Muscular strength 5/5 in all groups tested bilateral.  Gait: Unassisted, Nonantalgic.      Assessment:   Left fifth toe fracture    Plan:  -Treatment options discussed including all alternatives, risks, and complications -Etiology of symptoms were discussed -X-rays were obtained and reviewed with the patient.   Fracture present in the left fifth toe and proximal phalanx base.  Toes in rectus position. -Remain in surgical shoe for now continue buddy splinting.  Discussed meantime he can take the toe to heal.  Return in about 4 weeks (around 02/15/2019).  Trula Slade DPM

## 2019-01-18 NOTE — Patient Instructions (Signed)
Toe Fracture A toe fracture is a break in one of the toe bones (phalanges). This may happen if you:  Drop a heavy object on your toe.  Stub your toe.  Twist your toe.  Exercise the same way too much. What are the signs or symptoms? The main symptoms are swelling and pain in the toe. You may also have:  Bruising.  Stiffness.  Numbness.  A change in the way the toe looks.  Broken bones that poke through the skin.  Blood under the toenail. How is this treated? Treatments may include:  Taping the broken toe to a toe that is next to it (buddy taping).  Wearing a shoe that has a wide, rigid sole to protect the toe and to limit its movement.  Wearing a cast.  Surgery. This may be needed if the: ? Pieces of broken bone are out of place. ? Bone pokes through the skin.  Physical therapy. Follow these instructions at home: If you have a shoe:  Wear the shoe as told by your doctor. Remove it only as told by your doctor.  Loosen the shoe if your toes tingle, become numb, or turn cold and blue.  Keep the shoe clean and dry. If you have a cast:  Do not put pressure on any part of the cast until it is fully hardened. This may take a few hours.  Do not stick anything inside the cast to scratch your skin.  Check the skin around the cast every day. Tell your doctor about any concerns.  You may put lotion on dry skin around the edges of the cast.  Do not put lotion on the skin under the cast.  Keep the cast clean and dry. Bathing  Do not take baths, swim, or use a hot tub until your doctor says it is okay. Ask your doctor if you can take showers.  If the shoe or cast is not waterproof: ? Do not let it get wet. ? Cover it with a watertight covering when you take a bath or a shower. Activity  Do not use your foot to support your body weight until your doctor says it is okay.  Use crutches as told by your doctor.  Ask your doctor what activities are safe for you  during recovery.  Avoid activities as told by your doctor.  Do exercises as told by your doctor or therapist. Driving  Do not drive or use heavy machinery while taking pain medicine.  Do not drive while wearing a cast on a foot that you use for driving. Managing pain, stiffness, and swelling   Put ice on the injured area if told by your doctor: ? Put ice in a plastic bag. ? Place a towel between your skin and the bag.  If you have a shoe, remove it as told by your doctor.  If you have a cast, place a towel between your cast and the bag. ? Leave the ice on for 20 minutes, 2-3 times per day.  Raise (elevate) the injured area above the level of your heart while you are sitting or lying down. General instructions  If your toe was taped to a toe that is next to it, follow your doctor's instructions for changing the gauze and tape. Change it more often: ? If the gauze and tape get wet. If this happens, dry the space between the toes. ? If the gauze and tape are too tight and they cause your toe to become pale   or to lose feeling (go numb).  If your doctor did not give you a protective shoe, wear sturdy shoes that support your foot. Your shoes should not: ? Pinch your toes. ? Fit tightly against your toes.  Do not use any tobacco products, including cigarettes, chewing tobacco, or e-cigarettes. These can delay bone healing. If you need help quitting, ask your doctor.  Take medicines only as told by your doctor.  Keep all follow-up visits as told by your doctor. This is important. Contact a doctor if:  Your pain medicine is not helping.  You have a fever.  You notice a bad smell coming from your cast. Get help right away if:  You lose feeling (have numbness) in your toe or foot, and it is getting worse.  Your toe or your foot tingles.  Your toe or your foot gets cold or turns blue.  You have redness or swelling in your toe or foot, and it is getting worse.  You have very  bad pain. Summary  A toe fracture is a break in one of the toe bones.  Use ice and raise your foot. This will help lessen pain and swelling.  Use crutches as told by your doctor. This information is not intended to replace advice given to you by your health care provider. Make sure you discuss any questions you have with your health care provider. Document Released: 09/24/2007 Document Revised: 06/11/2017 Document Reviewed: 05/19/2017 Elsevier Patient Education  2020 Elsevier Inc.  

## 2019-01-20 ENCOUNTER — Encounter: Payer: Self-pay | Admitting: Family Medicine

## 2019-01-20 ENCOUNTER — Ambulatory Visit (INDEPENDENT_AMBULATORY_CARE_PROVIDER_SITE_OTHER): Payer: Medicaid Other | Admitting: Family Medicine

## 2019-01-20 ENCOUNTER — Other Ambulatory Visit: Payer: Self-pay

## 2019-01-20 VITALS — BP 98/68 | HR 101 | Wt 245.8 lb

## 2019-01-20 DIAGNOSIS — G4700 Insomnia, unspecified: Secondary | ICD-10-CM | POA: Diagnosis not present

## 2019-01-20 DIAGNOSIS — E1169 Type 2 diabetes mellitus with other specified complication: Secondary | ICD-10-CM | POA: Diagnosis not present

## 2019-01-20 DIAGNOSIS — H43393 Other vitreous opacities, bilateral: Secondary | ICD-10-CM

## 2019-01-20 DIAGNOSIS — E669 Obesity, unspecified: Secondary | ICD-10-CM

## 2019-01-20 DIAGNOSIS — M5412 Radiculopathy, cervical region: Secondary | ICD-10-CM

## 2019-01-20 DIAGNOSIS — S92501D Displaced unspecified fracture of right lesser toe(s), subsequent encounter for fracture with routine healing: Secondary | ICD-10-CM

## 2019-01-20 LAB — POCT GLYCOSYLATED HEMOGLOBIN (HGB A1C): HbA1c, POC (controlled diabetic range): 6.9 % (ref 0.0–7.0)

## 2019-01-20 MED ORDER — MELOXICAM 7.5 MG PO TABS: 7.5000 mg | ORAL_TABLET | Freq: Every day | ORAL | 0 refills | Status: DC

## 2019-01-20 MED ORDER — NAPROXEN 500 MG PO TABS: 500.0000 mg | ORAL_TABLET | Freq: Two times a day (BID) | ORAL | 0 refills | Status: DC

## 2019-01-20 MED ORDER — METFORMIN HCL 1000 MG PO TABS: 1000.0000 mg | ORAL_TABLET | Freq: Two times a day (BID) | ORAL | 3 refills | Status: DC

## 2019-01-20 NOTE — Patient Instructions (Addendum)
Thank you for coming to see me today. It was a pleasure! Today we talked about:   Restart your metformin at 1000 milligrams twice daily as you were taking before. Please continue working on diet and exercise.  For your pain we will trial naproxen rather than ibuprofen.  Please stop taking ibuprofen.  I would use Tylenol in addition to the naproxen.  Please try to take the naproxen with food.  Keep your follow-up with your podiatrist as well as your psychiatrist regarding her sleep.  Please follow-up with me in 3 months or sooner as needed.  If you have any questions or concerns, please do not hesitate to call the office at [phone].  Take Care,   [place] Kreitzer, DO  Suggestions for a good bedtime routine:  - Have a consistent bedtime routine. Remember: Brush, book, bed. Help your child brush their teeth, read a book together in bed, and then turn out the lights. - Have your child get in bed at the same time every night - Do not allow your child to get up for a drink or snack during the night - If the child has a TV in the room, remove it. The lights and sounds of TV throughout the night make it harder to get to sleep and stay asleep. They are the most common cause of kids going to sleep very late at night  Diet Recommendations for Diabetes  Carbohydrate includes starch, sugar, and fiber.  Of these, only sugar and starch raise blood glucose.  (Fiber is found in fruits, vegetables [especially skin, seeds, and stalks] and whole grains.)   Starchy (carb) foods: Bread, rice, pasta, potatoes, corn, cereal, grits, crackers, bagels, muffins, all baked goods.  (Fruit, milk, and yogurt also have carbohydrate, but most of these foods will not spike your blood sugar as most starchy foods will.)  A few fruits do cause high blood sugars; use small portions of bananas (limit to 1/2 at a time), grapes, watermelon, oranges, and most tropical fruits.   Protein foods: Meat, fish, poultry, eggs, dairy  foods, and beans such as pinto and kidney beans (beans also provide carbohydrate).   1. Eat at least REAL 3 meals and 1-2 snacks per day. Never go more than 4-5 hours while awake without eating. Eat breakfast within the first hour of getting up.   2. Limit starchy foods to TWO per meal and ONE per snack. ONE portion of a starchy  food is equal to the following:   - ONE slice of bread (or its equivalent, such as half of a hamburger bun).   - 1/2 cup of a "scoopable" starchy food such as potatoes or rice.   - 15 grams of Total Carbohydrate as shown on food label.  3. Include at every meal: a protein food, a carb food, and vegetables and/or fruit.   - Obtain twice the volume of veg's as protein or carbohydrate foods for both lunch and dinner.   - Fresh or frozen veg's are best.   - Keep frozen veg's on hand for a quick vegetable serving.

## 2019-01-20 NOTE — Progress Notes (Signed)
Subjective:  Patient ID: Christina Morton  DOB: 1986/03/18 MRN: [id number]  Christina Morton is a 33 y.o. female with a PMH of T2DM, depression, here today for diabetes follow-up.   HPI:  Minimally displaced fracture of right fifth proximal phalanx: -Patient initially broke her toe on 01/08/2019 and has since been following up with podiatry.  Had a repeat x-ray on 9/29.  Patient is currently in offloading boot with buddy taping and is to continue wearing this for 2 weeks.  She states that she has been trying Tylenol alternating with ibuprofen and is still having significant pain.  She has 2 children at home and would like better pain control so that she can take better care of her children. -She denies any swelling to her foot, numbness, tingling  Insomnia: Patient reports that she has been on hydroxyzine 10 mg for sleep.   She states that it is no longer working.  She states she is previously tried melatonin.  She has a psychiatrist will follow-up in 1 to 2 weeks in order to discuss that the hydroxyzine is no longer working. -Patient reports that prior to going to bed she was sent on her phone for hours ahead of time.  After she gets her children to bed she does not have any sort of route.  She does not exercise regularly.  She does not drink any caffeine.  Diabetes Disease Monitoring: Blood Sugar ranges(Severity) -has not been checking Medications: Recently trialed off of metformin Last foot exam: up to date ROS: denies dizziness, diaphoresis, LOC, polyuria, polydipsia  Monitoring Labs and Parameters Last A1C:  Lab Results  Component Value Date   HGBA1C 6.9 01/20/2019   Last Lipid: No results found for: CHOL, HDL, LDLDIRECT Last Bmet  Creatinine, Ser  Date Value Ref Range Status  07/21/2018 0.69 0.44 - 1.00 mg/dL Final     ROS: As mentioned in HPI  Social hx: Denies use of illicit drugs, alcohol use Smoking status reviewed  Patient Active Problem List   Diagnosis Date Noted  .  Insomnia 01/23/2019  . Closed fracture of phalanx of right fifth toe 01/23/2019  . Cervical radiculopathy 12/08/2018  . 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 Depression 03/23/2018  . Glaucoma 03/23/2018  . Family history of heart disease 01/21/2018  . Diabetes mellitus type 2 in obese ([place]) 01/21/2018     Objective:  BP 98/68   Pulse (!) 101   Wt 245 lb 12.8 oz (111.5 kg)   SpO2 98%   BMI 38.50 kg/m   Vitals and nursing note reviewed  General: NAD, pleasant Pulm: normal effort, CTAB Cardiac: RRR, normal heart sounds, no m/r/g Extremities: no edema or cyanosis. WWP.  Right foot in boot Skin: warm and dry, no rashes noted Neuro: alert and oriented, no focal deficits Psych: normal affect, normal thought content  Assessment & Plan:   Diabetes mellitus type 2 in obese [facility]) Given that patient A1c increased to 6.9 will restart metformin at 2000 mg daily that she was previously taking.  Counseled on continuing dietary changes and exercising.  She is to return in 3 months for recheck.  Insomnia Patient is to follow-up with psychiatrist in 1 to 2 weeks to discuss medication changes.  In the meantime counseled patient on good sleep hygiene given that she currently has very poor hygiene.  She is also going to try to exercise more.  She may also again try melatonin along with the changes to see if this helps.  Closed fracture of phalanx  of right fifth toe Patient remains in boot with buddy taping.  She is to follow-up with podiatry for this.  In the meantime for pain patient is to continue taking Tylenol scheduled with Mobic in between given that she is not having any improvement with ibuprofen.  She will take the Mobic with food.    [place] Cyrilla Durkin, DO Family Medicine Resident PGY-3

## 2019-01-23 DIAGNOSIS — G4700 Insomnia, unspecified: Secondary | ICD-10-CM | POA: Insufficient documentation

## 2019-01-23 DIAGNOSIS — S92501A Displaced unspecified fracture of right lesser toe(s), initial encounter for closed fracture: Secondary | ICD-10-CM | POA: Insufficient documentation

## 2019-01-23 NOTE — Assessment & Plan Note (Signed)
Patient remains in boot with buddy taping.  She is to follow-up with podiatry for this.  In the meantime for pain patient is to continue taking Tylenol scheduled with Mobic in between given that she is not having any improvement with ibuprofen.  She will take the Mobic with food.

## 2019-01-23 NOTE — Assessment & Plan Note (Signed)
Given that patient A1c increased to 6.9 will restart metformin at 2000 mg daily that she was previously taking.  Counseled on continuing dietary changes and exercising.  She is to return in 3 months for recheck.

## 2019-01-23 NOTE — Assessment & Plan Note (Signed)
Patient is to follow-up with psychiatrist in 1 to 2 weeks to discuss medication changes.  In the meantime counseled patient on good sleep hygiene given that she currently has very poor hygiene.  She is also going to try to exercise more.  She may also again try melatonin along with the changes to see if this helps.

## 2019-01-25 ENCOUNTER — Ambulatory Visit: Payer: Medicaid Other | Admitting: Neurology

## 2019-01-25 NOTE — Progress Notes (Deleted)
NEUROLOGY CONSULTATION NOTE  Christina Morton MRN: [id number] DOB: 1986-04-17  Referring provider: Tomi Likens, MD Primary care provider: [place] Shirley, DO  Reason for consult:  Vitreous floaters  HISTORY OF PRESENT ILLNESS: Christina Morton is a 33 year old female with type 2 diabetes mellitus and depression who presents for vitreous floaters in both eyes.  ***  ***  She was evaluated by Dr. 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  MRI of orbits with and without contrast on 10/19/2018 was personally reviewed and demonstrated partially empty sella with slight prominent fluid in the optic nerve sheaths bilaterally but otherwise unremarkable with no obvious intracranial mass.  Hgb A1c from 01/20/2019 was 6.9.  PAST MEDICAL HISTORY: Past Medical History:  Diagnosis Date  . Anemia   . Depression   . Glaucoma   . History of cesarean section, classical 01/21/2018   Low transverse with 5 cm extension into muscular layer--was told at [facility] risk of rupture--will treat as prior classical  . Mental disorder   . Type 2 diabetes mellitus without complications (HCC)   . 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 CESAREAN SECTION    . CESAREAN SECTION WITH BILATERAL TUBAL LIGATION Bilateral 07/21/2018   Procedure: CESAREAN SECTION WITH BILATERAL TUBAL LIGATION;  Surgeon: Christina Running, MD;  Location: [facility] LD ORS;  Service: Obstetrics;  Laterality: Bilateral;  . CYSTECTOMY      MEDICATIONS: Current Outpatient Medications on File Prior to Visit  Medication Sig Dispense Refill  . famotidine (PEPCID) 20 MG tablet Take 1 tablet (20 mg total) by mouth at bedtime. 30 tablet 1  . FLUoxetine (PROZAC) 10 MG tablet Take 10 mg by mouth daily.    . hydrOXYzine (ATARAX/VISTARIL) 10 MG tablet Take 10 mg by mouth 3 (three) times daily as  needed.    [name] ibuprofen (ADVIL) 600 MG tablet Take 1 tablet (600 mg total) by mouth every 6 (six) hours as needed. 30 tablet 0  . meloxicam (MOBIC) 7.5 MG tablet Take 1 tablet (7.5 mg total) by mouth daily. 14 tablet 0  . metFORMIN (GLUCOPHAGE) 1000 MG tablet Take 1 tablet (1,000 mg total) by mouth 2 (two) times daily with a meal. 60 tablet 3   No current facility-administered medications on file prior to visit.     ALLERGIES: Allergies  Allergen Reactions  . Amoxicillin Anaphylaxis    Did it involve swelling of the face/tongue/throat, SOB, or low BP? Yes Did it involve sudden or severe rash/hives, skin peeling, or any reaction on the inside of your mouth or nose? No Did you need to seek medical attention at a hospital or doctor's office? Yes When did it last happen?childhood allergy If all above answers are "NO", may proceed with cephalosporin use.  [name] Penicillins Anaphylaxis    Did it involve swelling of the face/tongue/throat, SOB, or low BP? Yes Did it involve sudden or severe rash/hives, skin peeling, or any reaction on the inside of your mouth or nose? No Did you need to seek medical attention at a hospital or doctor's office? Yes When did it last happen?childhood allergy If all above answers are "NO", may proceed with cephalosporin use.     FAMILY HISTORY: Family History  Problem Relation Age of Onset  . Hypertrophic cardiomyopathy Mother   . Bradycardia Mother   . Healthy Father  SOCIAL HISTORY: Social History   Socioeconomic History  . Marital status: Single    Spouse name: Not on file  . Number of children: Not on file  . Years of education: Not on file  . Highest education level: Not on file  Occupational History  . Not on file  Social Needs  . Financial resource strain: Not on file  . Food insecurity    Worry: Never true    Inability: Never true  . Transportation needs    Medical: No    Non-medical: No  Tobacco Use  . Smoking status:  Former Smoker    Packs/day: 1.00    Years: 1.00    Pack years: 1.00    Types: Cigarettes    Quit date: 11/22/2017    Years since quitting: 1.1  . Smokeless tobacco: Never Used  Substance and Sexual Activity  . Alcohol use: Never    Frequency: Never  . Drug use: Never  . Sexual activity: Yes    Birth control/protection: None  Lifestyle  . Physical activity    Days per week: Not on file    Minutes per session: Not on file  . Stress: Not on file  Relationships  . Social Herbalist on phone: Not on file    Gets together: Not on file    Attends religious service: Not on file    Active member of club or organization: Not on file    Attends meetings of clubs or organizations: Not on file    Relationship status: Not on file  . Intimate partner violence    Fear of current or ex partner: No    Emotionally abused: No    Physically abused: No    Forced sexual activity: No  Other Topics Concern  . Not on file  Social History Narrative  . Not on file    REVIEW OF SYSTEMS: Constitutional: No fevers, chills, or sweats, no generalized fatigue, change in appetite Eyes: No visual changes, double vision, eye pain Ear, nose and throat: No hearing loss, ear pain, nasal congestion, sore throat Cardiovascular: No chest pain, palpitations Respiratory:  No shortness of breath at rest or with exertion, wheezes GastrointestinaI: No nausea, vomiting, diarrhea, abdominal pain, fecal incontinence Genitourinary:  No dysuria, urinary retention or frequency Musculoskeletal:  No neck pain, back pain Integumentary: No rash, pruritus, skin lesions Neurological: as above Psychiatric: No depression, insomnia, anxiety Endocrine: No palpitations, fatigue, diaphoresis, mood swings, change in appetite, change in weight, increased thirst Hematologic/Lymphatic:  No purpura, petechiae. Allergic/Immunologic: no itchy/runny eyes, nasal congestion, recent allergic reactions, rashes  PHYSICAL EXAM: ***  General: No acute distress.  Patient appears well-groomed. Head:  Normocephalic/atraumatic Eyes:  fundi examined but not visualized Neck: supple, no paraspinal tenderness, full range of motion Back: No paraspinal tenderness Heart: regular rate and rhythm Lungs: Clear to auscultation bilaterally. Vascular: No carotid bruits. Neurological Exam: Mental status: alert and oriented to person, place, and time, recent and remote memory intact, fund of knowledge intact, attention and concentration intact, speech fluent and not dysarthric, language intact. Cranial nerves: CN I: not tested CN II: pupils equal, round and reactive to light, visual fields intact CN III, IV, VI:  full range of motion, no nystagmus, no ptosis CN V: facial sensation intact CN VII: upper and lower face symmetric CN VIII: hearing intact CN IX, X: gag intact, uvula midline CN XI: sternocleidomastoid and trapezius muscles intact CN XII: tongue midline Bulk & Tone: normal, no fasciculations. Motor:  5/5 throughout *** Sensation:  Pinprick *** temperature *** and vibration sensation intact.  ***. Deep Tendon Reflexes:  2+ throughout, *** toes downgoing.  *** Finger to nose testing:  Without dysmetria.  *** Heel to shin:  Without dysmetria.  *** Gait:  Normal station and stride.  Able to turn and tandem walk. Romberg ***.  IMPRESSION: ***  PLAN: ***  Thank you for allowing me to take part in the care of this patient.  Metta Clines, DO  CC: ***

## 2019-02-07 ENCOUNTER — Other Ambulatory Visit: Payer: Self-pay

## 2019-02-07 ENCOUNTER — Encounter (INDEPENDENT_AMBULATORY_CARE_PROVIDER_SITE_OTHER): Payer: Medicaid Other | Admitting: Ophthalmology

## 2019-02-07 DIAGNOSIS — H43813 Vitreous degeneration, bilateral: Secondary | ICD-10-CM

## 2019-02-07 DIAGNOSIS — H5213 Myopia, bilateral: Secondary | ICD-10-CM | POA: Diagnosis not present

## 2019-02-07 DIAGNOSIS — H33303 Unspecified retinal break, bilateral: Secondary | ICD-10-CM

## 2019-02-08 NOTE — Progress Notes (Signed)
NEUROLOGY CONSULTATION NOTE  Christina Morton MRN: [id number] DOB: [birth year]  Referring provider: Tomi LikensMarshall T Chambliss, MD Primary care provider: [place] Shirley, DO  Reason for consult:  Vitreous floaters  HISTORY OF PRESENT ILLNESS: Christina MustacheJasmine Morton is a 33 year old female with type 2 diabetes mellitus and depression who presents for vitreous floaters in both eyes.  History supplemented by referring provider note.  MRI of orbits and cervical spine personally reviewed.  Since childhood, she reports flashing dark floaters in her vision.  It has occurred off and on everyday.  She denies pulsatile tinnitus.  She reports history of migraines starting in her mid-20s.  The typically are severe throbbing bi-temporal headaches associated with photophobia and phonophobia and osmophobia.  No associated nausea or vomiting.  It typically lasts 3 or 4 hours.  They typically occur 3 to 4 times a week.  No specific triggers.  They are relieved by sleep in a dark and quiet room.  Tylenol sometimes helps.  She reports that her mother and sister have migraines.  Her sister also has idiopathic intracranial hypertension.  She was evaluated by Dr. 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  Visual field testing may have showed peripheral vision loss in her right eye.  MRI of orbits with and without contrast on 10/19/2018 was personally reviewed and demonstrated partially empty sella with slight prominent fluid in the optic nerve sheaths bilaterally but otherwise unremarkable with no obvious intracranial mass.  She also had an MRI of cervical spine without contrast was performed on 01/09/2019 for left sided neck and shoulder pain, which showed mild bilateral neural foraminal narrowing at C3-4 and C4-5 but no evidence of nerve root impingement, stenosis or cord abnormality.  01/20/2019 HGB A1c 6.9   PAST MEDICAL HISTORY: Past Medical History:  Diagnosis Date  . Anemia   .  Depression   . Glaucoma   . History of cesarean section, classical 01/21/2018   Low transverse with 5 cm extension into muscular layer--[facility]--will treat as prior classical  . Mental disorder   . Type 2 diabetes mellitus without complications (HCC)   . 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 CESAREAN SECTION    . CESAREAN SECTION WITH BILATERAL TUBAL LIGATION Bilateral 07/21/2018   Procedure: CESAREAN SECTION WITH BILATERAL TUBAL LIGATION;  Surgeon: Kathrynn RunningWouk, Noah Bedford, MD;  Location: [facility] LD ORS;  Service: Obstetrics;  Laterality: Bilateral;  . CYSTECTOMY      MEDICATIONS: Current Outpatient Medications on File Prior to Visit  Medication Sig Dispense Refill  . famotidine (PEPCID) 20 MG tablet Take 1 tablet (20 mg total) by mouth at bedtime. 30 tablet 1  . FLUoxetine (PROZAC) 10 MG tablet Take 10 mg by mouth daily.    . hydrOXYzine (ATARAX/VISTARIL) 10 MG tablet Take 10 mg by mouth 3 (three) times daily as needed.    [name]. ibuprofen (ADVIL) 600 MG tablet Take 1 tablet (600 mg total) by mouth every 6 (six) hours as needed. 30 tablet 0  . meloxicam (MOBIC) 7.5 MG tablet Take 1 tablet (7.5 mg total) by mouth daily. 14 tablet 0  . metFORMIN (GLUCOPHAGE) 1000 MG tablet Take 1 tablet (1,000 mg total) by mouth 2 (two) times daily with a meal. 60 tablet 3   No current facility-administered medications on file prior to visit.  ALLERGIES: Allergies  Allergen Reactions  . Amoxicillin Anaphylaxis    Did it involve swelling of the face/tongue/throat, SOB, or low BP? Yes Did it involve sudden or severe rash/hives, skin peeling, or any reaction on the inside of your mouth or nose? No Did you need to seek medical attention at a hospital or doctor's office? Yes When did it last happen?childhood allergy If all above answers are "NO", may proceed with  cephalosporin use.  [name] Penicillins Anaphylaxis    Did it involve swelling of the face/tongue/throat, SOB, or low BP? Yes Did it involve sudden or severe rash/hives, skin peeling, or any reaction on the inside of your mouth or nose? No Did you need to seek medical attention at a hospital or doctor's office? Yes When did it last happen?childhood allergy If all above answers are "NO", may proceed with cephalosporin use.     FAMILY HISTORY: Family History  Problem Relation Age of Onset  . Hypertrophic cardiomyopathy Mother   . Bradycardia Mother   . Healthy Father     SOCIAL HISTORY: Social History   Socioeconomic History  . Marital status: Single    Spouse name: Not on file  . Number of children: Not on file  . Years of education: Not on file  . Highest education level: Not on file  Occupational History  . Not on file  Social Needs  . Financial resource strain: Not on file  . Food insecurity    Worry: Never true    Inability: Never true  . Transportation needs    Medical: No    Non-medical: No  Tobacco Use  . Smoking status: Former Smoker    Packs/day: 1.00    Years: 1.00    Pack years: 1.00    Types: Cigarettes    Quit date: 11/22/2017    Years since quitting: 1.2  . Smokeless tobacco: Never Used  Substance and Sexual Activity  . Alcohol use: Never    Frequency: Never  . Drug use: Never  . Sexual activity: Yes    Birth control/protection: None  Lifestyle  . Physical activity    Days per week: Not on file    Minutes per session: Not on file  . Stress: Not on file  Relationships  . Social Musician on phone: Not on file    Gets together: Not on file    Attends religious service: Not on file    Active member of club or organization: Not on file    Attends meetings of clubs or organizations: Not on file    Relationship status: Not on file  . Intimate partner violence    Fear of current or ex partner: No    Emotionally abused: No     Physically abused: No    Forced sexual activity: No  Other Topics Concern  . Not on file  Social History Narrative  . Not on file    REVIEW OF SYSTEMS: Constitutional: No fevers, chills, or sweats, no generalized fatigue, change in appetite Eyes: No visual changes, double vision, eye pain Ear, nose and throat: No hearing loss, ear pain, nasal congestion, sore throat Cardiovascular: No chest pain, palpitations Respiratory:  No shortness of breath at rest or with exertion, wheezes GastrointestinaI: No nausea, vomiting, diarrhea, abdominal pain, fecal incontinence Genitourinary:  No dysuria, urinary retention or frequency Musculoskeletal:  No neck pain, back pain Integumentary: No rash, pruritus, skin lesions Neurological: as above Psychiatric: No depression, insomnia, anxiety Endocrine: No palpitations, fatigue,  diaphoresis, mood swings, change in appetite, change in weight, increased thirst Hematologic/Lymphatic:  No purpura, petechiae. Allergic/Immunologic: no itchy/runny eyes, nasal congestion, recent allergic reactions, rashes  PHYSICAL EXAM: Blood pressure 100/73, pulse 82, height 5\' 8"  (1.727 m), weight 247 lb (112 kg), SpO2 100 %, unknown if currently breastfeeding. General: No acute distress.  Patient appears well-groomed. Head:  Normocephalic/atraumatic Eyes:  fundi examined but not visualized Neck: supple, no paraspinal tenderness, full range of motion Back: No paraspinal tenderness Heart: regular rate and rhythm Lungs: Clear to auscultation bilaterally. Vascular: No carotid bruits. Neurological Exam: Mental status: alert and oriented to person, place, and time, recent and remote memory intact, fund of knowledge intact, attention and concentration intact, speech fluent and not dysarthric, language intact. Cranial nerves: CN I: not tested CN II: pupils equal, round and reactive to light, visual fields intact CN III, IV, VI:  full range of motion, no nystagmus, no ptosis  CN V: facial sensation intact CN VII: upper and lower face symmetric CN VIII: hearing intact CN IX, X: gag intact, uvula midline CN XI: sternocleidomastoid and trapezius muscles intact CN XII: tongue midline Bulk & Tone: normal, no fasciculations. Motor:  5/5 throughout  Sensation:  temperature and vibration sensation intact.   Deep Tendon Reflexes:  2+ throughout, toes downgoing.   Finger to nose testing:  Without dysmetria.   Heel to shin:  Without dysmetria.   Gait:  Normal station and stride.  Able to turn and tandem walk. Romberg negative.  IMPRESSION: 1.  Questionable increased intracranial pressure 2.  Migraine without aura, without status migrianosus  PLAN: 1.  MRI of brain w/wo and MRV of head 2.  Followed by lumbar puncture measuring opening pressure and checking CSF cell count, protein, glucose, gram stain and culture. 3.  Follow up after testing  Potential management may be complicated as the two most common pharmacologic treatments for idiopathic intracranial hypertension are acetazolamide and topiramate, which may aggravate glaucoma.  Will consider furosemide.  Thank you for allowing me to take part in the care of this patient.  Metta Clines, DO  CC: [place] Shirley, DO

## 2019-02-11 ENCOUNTER — Ambulatory Visit (INDEPENDENT_AMBULATORY_CARE_PROVIDER_SITE_OTHER): Payer: Medicaid Other | Admitting: Neurology

## 2019-02-11 ENCOUNTER — Other Ambulatory Visit: Payer: Self-pay

## 2019-02-11 ENCOUNTER — Encounter: Payer: Self-pay | Admitting: Neurology

## 2019-02-11 VITALS — BP 100/73 | HR 82 | Ht 68.0 in | Wt 247.0 lb

## 2019-02-11 DIAGNOSIS — G43009 Migraine without aura, not intractable, without status migrainosus: Secondary | ICD-10-CM | POA: Diagnosis not present

## 2019-02-11 DIAGNOSIS — G932 Benign intracranial hypertension: Secondary | ICD-10-CM

## 2019-02-11 NOTE — Patient Instructions (Signed)
1.  MRI of brain with and without contrast and MRV of head 2.  Spinal tap checking CSF cell count, protein, glucose, gram stain and culture. 3.  Follow up after testing (virtual visit)

## 2019-02-15 ENCOUNTER — Ambulatory Visit: Payer: Medicaid Other | Admitting: Podiatry

## 2019-02-28 ENCOUNTER — Ambulatory Visit (INDEPENDENT_AMBULATORY_CARE_PROVIDER_SITE_OTHER): Payer: Medicaid Other

## 2019-02-28 ENCOUNTER — Other Ambulatory Visit: Payer: Self-pay

## 2019-02-28 ENCOUNTER — Ambulatory Visit (INDEPENDENT_AMBULATORY_CARE_PROVIDER_SITE_OTHER): Payer: Medicaid Other | Admitting: Podiatry

## 2019-02-28 DIAGNOSIS — S92504D Nondisplaced unspecified fracture of right lesser toe(s), subsequent encounter for fracture with routine healing: Secondary | ICD-10-CM | POA: Diagnosis not present

## 2019-02-28 DIAGNOSIS — S92511A Displaced fracture of proximal phalanx of right lesser toe(s), initial encounter for closed fracture: Secondary | ICD-10-CM | POA: Diagnosis not present

## 2019-02-28 DIAGNOSIS — S92504A Nondisplaced unspecified fracture of right lesser toe(s), initial encounter for closed fracture: Secondary | ICD-10-CM

## 2019-02-28 NOTE — Progress Notes (Signed)
Subjective: 33 year old female presents the office for follow-up evaluation of her fracture right fifth toe.  She states that she is feeling better.  She has an occasional discomfort but overall much improved.  She is back to wearing an open toed sandal which is been helpful.  Minimal swelling. Denies any systemic complaints such as fevers, chills, nausea, vomiting. No acute changes since last appointment, and no other complaints at this time.   Objective: AAO x3, NAD DP/PT pulses palpable bilaterally, CRT less than 3 seconds The toes are in rectus position.  There is minimal tenderness on the proximal phalanx of the right fifth digit.  There is no significant edema there is no erythema or warmth.  No open lesions No pain with calf compression, swelling, warmth, erythema  Assessment: Healing fracture right fifth  Plan: -All treatment options discussed with the patient including all alternatives, risks, complications.  -Repeat x-rays obtained reviewed which does reveal a radiolucent line at the proximal phalanx.  There is bone callus formation. -At this point continue with the use of the surgical shoe for open toed shoe to avoid pressure.  As she starts to feel better then she can start to wear closed in shoes. -Patient encouraged to call the office with any questions, concerns, change in symptoms.   Return in about 4 weeks (around 03/28/2019), or if symptoms worsen or fail to improve.  Trula Slade DPM

## 2019-03-01 ENCOUNTER — Encounter: Payer: Self-pay | Admitting: Family Medicine

## 2019-03-01 ENCOUNTER — Other Ambulatory Visit: Payer: Medicaid Other

## 2019-03-01 ENCOUNTER — Other Ambulatory Visit: Payer: Self-pay | Admitting: Family Medicine

## 2019-03-01 MED ORDER — METFORMIN HCL ER 500 MG PO TB24: 500.0000 mg | ORAL_TABLET | Freq: Two times a day (BID) | ORAL | 3 refills | Status: DC

## 2019-03-02 ENCOUNTER — Other Ambulatory Visit: Payer: Medicaid Other

## 2019-03-24 ENCOUNTER — Ambulatory Visit
Admission: RE | Admit: 2019-03-24 | Discharge: 2019-03-24 | Disposition: A | Discharge status: Home / Self Care | Payer: Medicaid Other | Source: Ambulatory Visit | Attending: Neurology | Admitting: Neurology

## 2019-03-24 DIAGNOSIS — G43009 Migraine without aura, not intractable, without status migrainosus: Secondary | ICD-10-CM

## 2019-03-24 DIAGNOSIS — G932 Benign intracranial hypertension: Secondary | ICD-10-CM

## 2019-03-24 MED ORDER — GADOBENATE DIMEGLUMINE 529 MG/ML IV SOLN
20.0000 mL | Freq: Once | INTRAVENOUS | Status: AC | PRN
  Administered 2019-03-24: 20 mL via INTRAVENOUS

## 2019-03-28 ENCOUNTER — Other Ambulatory Visit: Payer: Medicaid Other

## 2019-03-28 ENCOUNTER — Ambulatory Visit: Payer: Medicaid Other | Admitting: Podiatry

## 2019-03-30 ENCOUNTER — Other Ambulatory Visit: Payer: Self-pay

## 2019-03-30 ENCOUNTER — Telehealth: Payer: Self-pay

## 2019-03-30 ENCOUNTER — Ambulatory Visit
Admission: RE | Admit: 2019-03-30 | Discharge: 2019-03-30 | Disposition: A | Discharge status: Home / Self Care | Payer: Medicaid Other | Source: Ambulatory Visit | Attending: Neurology | Admitting: Neurology

## 2019-03-30 VITALS — BP 121/72 | HR 69

## 2019-03-30 DIAGNOSIS — G932 Benign intracranial hypertension: Secondary | ICD-10-CM

## 2019-03-30 DIAGNOSIS — G43009 Migraine without aura, not intractable, without status migrainosus: Secondary | ICD-10-CM

## 2019-03-30 NOTE — Telephone Encounter (Signed)
Called patient she was informed of results and understands. She was sent to front desk to schedule VV to discuss.

## 2019-03-30 NOTE — Discharge Instructions (Signed)

## 2019-03-30 NOTE — Telephone Encounter (Signed)
-----   Message from [name], DO sent at 03/30/2019  3:03 PM EST ----- The intracranial pressure is only mildly elevated.  I would like for Christina Morton to make a follow up appointment to discuss further management (virtual visit is fine)

## 2019-04-03 LAB — CSF CULTURE W GRAM STAIN
MICRO NUMBER:: 1180284
Result:: NO GROWTH
SPECIMEN QUALITY:: ADEQUATE

## 2019-04-03 LAB — PROTEIN, CSF: Total Protein, CSF: 20 mg/dL (ref 15–45)

## 2019-04-03 LAB — CSF CELL COUNT WITH DIFFERENTIAL
RBC Count, CSF: 0 cells/uL
WBC, CSF: 0 cells/uL (ref 0–5)

## 2019-04-03 LAB — GLUCOSE, CSF: Glucose, CSF: 71 mg/dL (ref 40–80)

## 2019-04-06 ENCOUNTER — Ambulatory Visit: Payer: Medicaid Other | Admitting: Family Medicine

## 2019-04-07 ENCOUNTER — Encounter: Payer: Self-pay | Admitting: Neurology

## 2019-04-07 NOTE — Progress Notes (Addendum)
Virtual Visit via Video Note The purpose of this virtual visit is to provide medical care while limiting exposure to the novel coronavirus.    Consent was obtained for video visit:  Yes.   Answered questions that patient had about telehealth interaction:  Yes.   I discussed the limitations, risks, security and privacy concerns of performing an evaluation and management service by telemedicine. I also discussed with the patient that there may be a patient responsible charge related to this service. The patient expressed understanding and agreed to proceed.  Pt location: Home Physician Location: office Name of referring provider:  Shirley, [place], DO I connected with Christina Morton at patients initiation/request on 04/08/2019 at 11:00 AM EST by video enabled telemedicine application and verified that I am speaking with the correct person using two identifiers. Pt MRN:  [id number] Pt DOB:  February 13, 1986 Video Participants:  Christina Morton   History of Present Illness:  Christina Morton is a 33 year old female with type 2 diabetes mellitus and depression who follows up for migraines and questionable IIH.  UPDATE: MRI and MRV of head with contrast from 03/24/2019 were unremarkable.  She underwent LP on 03/30/2019, which demonstrated borderline elevated opening pressure of 21 cm H2O.  Labs for CSF analysis, including cell count, protein, glucose, and culture, were normal.  She reports continued migraines, severe, lasting 3 to 4 hours and occurring 3 to 4 days a week.    Current analgesic:  Tylenol:   Current antidepressant:  Cymbalta 30mg  daily  Past medications:  Tylenol PM, ibuprofen  HISTORY: Since childhood, she reports flashing dark floaters in her vision.  It has occurred off and on everyday.  She denies pulsatile tinnitus.  She reports history of migraines starting in her mid-20s.  The typically are severe throbbing bi-temporal headaches associated with photophobia and phonophobia and  osmophobia.  No associated nausea or vomiting.  It typically lasts 3 or 4 hours.  They typically occur 3 to 4 times a week.  No specific triggers.  They are relieved by sleep in a dark and quiet room.  Tylenol sometimes helps.  She reports that her mother and sister have migraines.  Her sister also has idiopathic intracranial hypertension.  She was evaluated by Dr. 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  Visual field testing may have showed peripheral vision loss in her right eye.  MRI of orbits with and without contrast on 10/19/2018 was personally reviewed and demonstrated partially empty sella with slight prominent fluid in the optic nerve sheaths bilaterally but otherwise unremarkable with no obvious intracranial mass.  She also had an MRI of cervical spine without contrast was performed on 01/09/2019 for left sided neck and shoulder pain, which showed mild bilateral neural foraminal narrowing at C3-4 and C4-5 but no evidence of nerve root impingement, stenosis or cord abnormality.  Past Medical History: Past Medical History:  Diagnosis Date  . Anemia   . Depression   . Glaucoma   . History of cesarean section, classical 01/21/2018   Low transverse with 5 cm extension into muscular layer--was told at [facility] risk of rupture--will treat as prior classical  . Mental disorder   . Type 2 diabetes mellitus without complications ([place])   . 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 DULoxetine (CYMBALTA) 30 MG capsule Take 30 mg by mouth daily.  [name]  famotidine (PEPCID) 20 MG tablet Take 1 tablet (20 mg total) by mouth at bedtime.  [name]. FLUoxetine (PROZAC) 10 MG tablet Take 10 mg by mouth daily.  . hydrOXYzine (ATARAX/VISTARIL) 10 MG tablet Take 10 mg by mouth 3 (three) times daily as needed.  [name]. ibuprofen (ADVIL) 600 MG tablet Take 1  tablet (600 mg total) by mouth every 6 (six) hours as needed.  . metFORMIN (GLUCOPHAGE-XR) 500 MG 24 hr tablet Take 1 tablet (500 mg total) by mouth 2 (two) times daily with a meal. Take pill with high-protein meal.  . traZODone (DESYREL) 50 MG tablet Take 50 mg by mouth at bedtime.   No facility-administered encounter medications on file as of 04/08/2019.    Allergies: Allergies  Allergen Reactions  . Amoxicillin Anaphylaxis    Did it involve swelling of the face/tongue/throat, SOB, or low BP? Yes Did it involve sudden or severe rash/hives, skin peeling, or any reaction on the inside of your mouth or nose? No Did you need to seek medical attention at a hospital or doctor's office? Yes When did it last happen?childhood allergy If all above answers are "NO", may proceed with cephalosporin use.  [name]. Penicillins Anaphylaxis    Did it involve swelling of the face/tongue/throat, SOB, or low BP? Yes Did it involve sudden or severe rash/hives, skin peeling, or any reaction on the inside of your mouth or nose? No Did you need to seek medical attention at a hospital or doctor's office? Yes When did it last happen?childhood allergy If all above answers are "NO", may proceed with cephalosporin use.     Family History: Family History  Problem Relation Age of Onset  . Hypertrophic cardiomyopathy Mother   . Bradycardia Mother   . Healthy Father     Social History: Social History   Socioeconomic History  . Marital status: Single    Spouse name: Not on file  . Number of children: 2  . Years of education: Not on file  . Highest education level: Some college, no degree  Occupational History  . Occupation: not working  Tobacco Use  . Smoking status: Former Smoker    Packs/day: 1.00    Years: 1.00    Pack years: 1.00    Types: Cigarettes    Quit date: 11/22/2017    Years since quitting: 1.3  . Smokeless tobacco: Never Used  Substance and Sexual Activity  . Alcohol use: Never   . Drug use: Never  . Sexual activity: Yes    Birth control/protection: None  Other Topics Concern  . Not on file  Social History Narrative   Right handed   Two story    No caffeine, maybe coffee once a week   Social Determinants of Health   Financial Resource Strain:   . Difficulty of Paying Living Expenses: Not on file  Food Insecurity: No Food Insecurity  . Worried About Programme researcher, broadcasting/film/videounning Out of Food in the Last Year: Never true  . Ran Out of Food in the Last Year: Never true  Transportation Needs: No Transportation Needs  . Lack of Transportation (Medical): No  . Lack of Transportation (Non-Medical): No  Physical Activity:   . Days of Exercise per Week: Not on file  . Minutes of Exercise per Session: Not on file  Stress:   . Feeling of Stress : Not on file  Social Connections:   . Frequency of Communication with Friends and Family: Not on file  . Frequency of Social Gatherings with Friends and Family:  Not on file  . Attends Religious Services: Not on file  . Active Member of Clubs or Organizations: Not on file  . Attends Banker Meetings: Not on file  . Marital Status: Not on file  Intimate Partner Violence: Not At Risk  . Fear of Current or Ex-Partner: No  . Emotionally Abused: No  . Physically Abused: No  . Sexually Abused: No    Observations/Objective:   Height 5\' 7"  (1.702 m), weight 255 lb (115.7 kg), last menstrual period 03/16/2019, unknown if currently breastfeeding. No acute distress.  Alert and oriented.  Speech fluent and not dysarthric.  Language intact.  Eyes orthophoric on primary gaze.  Face symmetric.  Assessment and Plan:   1.  Chronic migraine without aura, without status migrainosus, not intractable  1.  Questionable idiopathic intracranial hypertension.  MRI findings are nonspecific.  Opening CSF pressure only borderline elevated.    Ideally, I would like to start topiramate as it is used for both migraines and IIH.  She carries a diagnosis  of glaucoma and topiramate may cause closed-angle glaucoma.  I have contacted Dr. 03/18/2019 office and left a message for Dr. Laruth Bouchard to call me, to verify if his exam findings seemed consistent with papilledema, glaucoma (and if so, what type of glaucoma).    1.  In the meantime, I will prescribe her rizatriptan for rescue therapy 2.  Limit use of pain relievers to no more than 2 days out of week to prevent risk of rebound or medication-overuse headache. 3.  Follow up in 3 months.  ADDENDUM:  I spoke with Dr. Dione Booze.  Findings weren't consistent with definite glaucoma (maybe suspect).  No papilledema but she did demonstrate temporal vision loss in the right eye.  I will start her on topiramate to address migraines as well as any potential IIH.  Dr. Dione Booze has no objection to topiramate.  We will start 25mg  at bedtime for one week, then increase to 50mg  at bedtime.  She has an appointment with Dr. Dione Booze on 1/12, but he will re-evaluate in 6 to 8 weeks as well.   Follow Up Instructions:    -I discussed the assessment and treatment plan with the patient. The patient was provided an opportunity to ask questions and all were answered. The patient agreed with the plan and demonstrated an understanding of the instructions.   The patient was advised to call back or seek an in-person evaluation if the symptoms worsen or if the condition fails to improve as anticipated.   , DO

## 2019-04-08 ENCOUNTER — Encounter

## 2019-04-08 ENCOUNTER — Other Ambulatory Visit: Payer: Self-pay

## 2019-04-08 ENCOUNTER — Telehealth: Payer: Self-pay

## 2019-04-08 ENCOUNTER — Telehealth (INDEPENDENT_AMBULATORY_CARE_PROVIDER_SITE_OTHER): Payer: Medicaid Other | Admitting: Neurology

## 2019-04-08 VITALS — Ht 67.0 in | Wt 255.0 lb

## 2019-04-08 DIAGNOSIS — H409 Unspecified glaucoma: Secondary | ICD-10-CM

## 2019-04-08 DIAGNOSIS — G43009 Migraine without aura, not intractable, without status migrainosus: Secondary | ICD-10-CM | POA: Diagnosis not present

## 2019-04-08 DIAGNOSIS — G932 Benign intracranial hypertension: Secondary | ICD-10-CM

## 2019-04-08 MED ORDER — RIZATRIPTAN BENZOATE 10 MG PO TABS: ORAL_TABLET | ORAL | 3 refills | Status: DC

## 2019-04-08 MED ORDER — TOPIRAMATE 25 MG PO TABS: ORAL_TABLET | ORAL | 3 refills | Status: DC

## 2019-04-08 NOTE — Telephone Encounter (Signed)
-----   Message from [name], DO sent at 04/08/2019  2:14 PM EST ----- Please let Christina Morton know that I spoke with Dr. Katy Fitch.  I would like to start her on topiramate 25mg  at bedtime for one week, then increase to 50mg  at bedtime.  This should hopefully help reduce migraine frequency.  If headaches not improved by end of prescription, she should contact us and we can increase dose.  She should be aware that side effects may include numbness and tingling, so she should not be alarmed if she experiences this.  Often, this resolves once her body gets used to the medicine.

## 2019-04-08 NOTE — Telephone Encounter (Signed)
I spoke with patient and gave directions on the directions above for the toprimate and advised of the side effects

## 2019-05-04 ENCOUNTER — Other Ambulatory Visit: Payer: Self-pay

## 2019-05-04 ENCOUNTER — Encounter: Payer: Self-pay | Admitting: Family Medicine

## 2019-05-04 ENCOUNTER — Ambulatory Visit (INDEPENDENT_AMBULATORY_CARE_PROVIDER_SITE_OTHER): Payer: Medicaid Other | Admitting: Family Medicine

## 2019-05-04 VITALS — BP 104/68 | HR 82 | Wt 254.0 lb

## 2019-05-04 DIAGNOSIS — E1169 Type 2 diabetes mellitus with other specified complication: Secondary | ICD-10-CM | POA: Diagnosis not present

## 2019-05-04 DIAGNOSIS — E669 Obesity, unspecified: Secondary | ICD-10-CM

## 2019-05-04 DIAGNOSIS — G4700 Insomnia, unspecified: Secondary | ICD-10-CM

## 2019-05-04 LAB — POCT GLYCOSYLATED HEMOGLOBIN (HGB A1C): HbA1c, POC (controlled diabetic range): 6.5 % (ref 0.0–7.0)

## 2019-05-04 MED ORDER — ZOLPIDEM TARTRATE 5 MG PO TABS: 5.0000 mg | ORAL_TABLET | Freq: Every day | ORAL | 0 refills | Status: DC

## 2019-05-04 NOTE — Progress Notes (Signed)
  Subjective:  Patient ID: Christina Morton  DOB: 1985/08/15 MRN: [id number]  Christina Morton is a 34 y.o. female with a PMH of T2DM, depression, here today for diabetes and insomnia.   HPI:  Diabetes Medications: metformin 500mg  BID (unable to tolerate higher doses) Compliance (Modifying factor) - yes Hypoglycemic symptoms- no Not currently on statin Last foot exam: up to date ROS: denies dizziness, diaphoresis, LOC, polyuria, polydipsia  Monitoring Labs and Parameters Last A1C:  Lab Results  Component Value Date   HGBA1C 6.5 05/04/2019    Insomnia: Patient reports that she is still having trouble sleeping.  She states that she has tried hydroxyzine 10 mg, melatonin, trazodone and has been started on Prozac from her psychiatrist.  She has been working with her psychiatrist but does not have follow-up for another ~3 weeks.   -She reports that she has been beginning to walk in order to help with her sleep.  She does not drink any caffeine.  She states that she is only been sleeping about 1 to 2 hours and is quite exhausted with her baby at home. -Her baby is sleeping throughout the night it is not what is keeping her up at this time.  She states she has trouble falling asleep and staying asleep.  ROS: As mentioned in HPI  Social hx: Denies use of illicit drugs, alcohol use Smoking status reviewed  Patient Active Problem List   Diagnosis Date Noted  . Insomnia 01/23/2019  . Closed fracture of phalanx of right fifth toe 01/23/2019  . Cervical radiculopathy 12/08/2018  . 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 Depression 03/23/2018  . Glaucoma 03/23/2018  . Family history of heart disease 01/21/2018  . Diabetes mellitus type 2 in obese (HCC) 01/21/2018     Objective:  BP 104/68   Pulse 82   Wt 254 lb (115.2 kg)   SpO2 99%   BMI 39.78 kg/m   Vitals and nursing note reviewed  General: NAD, pleasant Pulm: normal effort Extremities: no edema or cyanosis. WWP. Skin:  warm and dry, no rashes noted Neuro: alert and oriented, no focal deficits Psych: normal affect, normal thought content  Assessment & Plan:   Insomnia Again patient counseled on sleep hygiene.  She needs to follow-up with her psychiatrist.  In the meantime given that patient has tried melatonin along with multiple other prescribed and over-the-counter things for sleep we will give patient short course of Ambien to help her sleep.  Patient counseled on risk of this medication and encouraged not to drive after taking it.  Patient also advised that this would not be a long-term prescription for her we are hopeful that she will find some other solution with her psychiatrist that is long-term.  Diabetes mellitus type 2 in obese (HCC) A1c well controlled today at 6.5.  We will continue with current Metformin 500 mg twice daily as patient was unable to tolerate 1000 mg twice daily dosage.  Encouraged good lifestyle changes and to continue walking.   03/23/2018 Trace Wirick, DO Family Medicine Resident PGY-3

## 2019-05-04 NOTE — Patient Instructions (Signed)
Thank you for coming to see me today. It was a pleasure! Today we talked about:   Please follow-up with your therapist regarding the treatment for your depression as well as your sleep.  In the meantime we will trial a short course of Ambien to see if this helps you get a little bit of sleep as none of the other medication support.  It is okay to take melatonin with this medication.  Please do not drive after taking this medication.  For your diabetes congratulations on starting to walk.  Please keep up the good work!  Please continue taking your Metformin for now.  Please follow-up with me in 6 months or sooner as needed.  If you have any questions or concerns, please do not hesitate to call the office at [phone].  Take Care,   [place] Nabil Bubolz, DO

## 2019-05-06 LAB — MICROALBUMIN / CREATININE URINE RATIO
Creatinine, Urine: 45.6 mg/dL
Microalb/Creat Ratio: 12 mg/g creat (ref 0–29)
Microalbumin, Urine: 5.3 ug/mL

## 2019-05-07 NOTE — Assessment & Plan Note (Signed)
Again patient counseled on sleep hygiene.  She needs to follow-up with her psychiatrist.  In the meantime given that patient has tried melatonin along with multiple other prescribed and over-the-counter things for sleep we will give patient short course of Ambien to help her sleep.  Patient counseled on risk of this medication and encouraged not to drive after taking it.  Patient also advised that this would not be a long-term prescription for her we are hopeful that she will find some other solution with her psychiatrist that is long-term.

## 2019-05-07 NOTE — Assessment & Plan Note (Signed)
A1c well controlled today at 6.5.  We will continue with current Metformin 500 mg twice daily as patient was unable to tolerate 1000 mg twice daily dosage.  Encouraged good lifestyle changes and to continue walking.

## 2019-05-25 ENCOUNTER — Telehealth: Payer: Medicaid Other | Admitting: Neurology

## 2019-06-07 ENCOUNTER — Other Ambulatory Visit: Payer: Self-pay | Admitting: Family Medicine

## 2019-06-07 ENCOUNTER — Encounter: Payer: Self-pay | Admitting: Family Medicine

## 2019-06-08 ENCOUNTER — Encounter: Payer: Self-pay | Admitting: Family Medicine

## 2019-06-09 ENCOUNTER — Encounter: Payer: Self-pay | Admitting: Family Medicine

## 2019-06-24 ENCOUNTER — Telehealth: Payer: Self-pay | Admitting: Family Medicine

## 2019-06-24 NOTE — Telephone Encounter (Signed)
[place] mental health therapist from [facility] is calling and would like to speak with Dr. Talbert Forest about coordinating care.   The best call back is [phone] direct extension is [phone]

## 2019-07-05 NOTE — Telephone Encounter (Signed)
Called and left voicemail for therapist to call me back at my own private number.

## 2019-07-06 NOTE — Progress Notes (Signed)
Virtual Visit via Video Note The purpose of this virtual visit is to provide medical care while limiting exposure to the novel coronavirus.    Consent was obtained for video visit:  Yes.   Answered questions that patient had about telehealth interaction:  Yes.   I discussed the limitations, risks, security and privacy concerns of performing an evaluation and management service by telemedicine. I also discussed with the patient that there may be a patient responsible charge related to this service. The patient expressed understanding and agreed to proceed.  Pt location: Home Physician Location: office Name of referring provider:  Shirley, [place], DO I connected with Christina Morton at patients initiation/request on 07/07/2019 at  8:30 AM EDT by video enabled telemedicine application and verified that I am speaking with the correct person using two identifiers. Pt MRN:  [id number] Pt DOB:  1985-08-15 Video Participants:  Christina Morton;   History of Present Illness:  Christina Morton is a 34 year old female with type 2 diabetes mellitus and depression who follows up for migraines.Christina Morton  UPDATE: Findings on eye exam did not appear consistent with papilledema.  She was started on topiramate for chronic migraines.  She has had only one migraine last month.  She reports continued migraines, severe, lasting 3 to 4 hours and occurring 3 to 4 days a week.    Current analgesic:  Tylenol: Current triptan:  Rizatriptan 10mg   (hasn't needed it yet) Current antidepressant:  fluoxetine 10mg  daily Current antiepileptic:  topiramate 50mg  at bedtime Current sleep aid:  Ambien  HISTORY: Since childhood, she reports flashing dark floaters in her vision. It has occurred off and on everyday. She denies pulsatile tinnitus. She reports history of migraines starting in her mid-20s. The typically are severe throbbing bi-temporal headaches associated with photophobia and phonophobia and osmophobia. No associated  nausea or vomiting. It typically lasts 3 or 4 hours. They typically occur 3 to 4 times a week. No specific triggers. They are relieved by sleep in a dark and quiet room. Tylenol sometimes helps. She reports that her mother and sister have migraines. Her sister also has idiopathic intracranial hypertension.  She was evaluated by Dr. 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  MRI and MRV of head with contrast from 03/24/2019 were unremarkable.  She underwent LP on 03/30/2019, which demonstrated borderline elevated opening pressure of 21 cm H2O.  Labs for CSF analysis, including cell count, protein, glucose, and culture, were normal.  She also had an MRI of cervical spine without contrast was performed on 01/09/2019 for left sided neck and shoulder pain, which showed mild bilateral neural foraminal narrowing at C3-4 and C4-5 but no evidence of nerve root impingement, stenosis or cord abnormality.  Past NSAIDs/analgesics:  Tylenol PM, ibuprofen Past antidepressant:  Cymbalta  Past Medical History: Past Medical History:  Diagnosis Date  . Anemia   . Depression   . Glaucoma   . History of cesarean section, classical 01/21/2018   Low transverse with 5 cm extension into muscular layer--was told at [facility] risk of rupture--will treat as prior classical  . Mental disorder   . Type 2 diabetes mellitus without complications ([facility])   . 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 famotidine (PEPCID) 20 MG tablet Take 1  tablet (20 mg total) by mouth at bedtime.  Christina Morton FLUoxetine (PROZAC) 10 MG tablet Take 10 mg by mouth daily.  . metFORMIN (GLUCOPHAGE-XR) 500 MG 24 hr tablet Take 1 tablet (500 mg total) by mouth 2 (two) times daily with a meal. Take pill with high-protein meal.  . rizatriptan (MAXALT) 10 MG tablet Take 1 tablet earliest onset of migraine.  May repeat in 2 hours if needed.  Maximum 2 tablets in 24 hours  . topiramate (TOPAMAX) 25 MG tablet Take 25mg  at hs for one week and then 50mg  at thereforth at hs  . zolpidem (AMBIEN) 5 MG tablet Take 1 tablet (5 mg total) by mouth at bedtime.   No facility-administered encounter medications on file as of 07/07/2019.    Allergies: Allergies  Allergen Reactions  . Amoxicillin Anaphylaxis    Did it involve swelling of the face/tongue/throat, SOB, or low BP? Yes Did it involve sudden or severe rash/hives, skin peeling, or any reaction on the inside of your mouth or nose? No Did you need to seek medical attention at a hospital or doctor's office? Yes When did it last happen?childhood allergy If all above answers are "NO", may proceed with cephalosporin use.  Christina Morton Penicillins Anaphylaxis    Did it involve swelling of the face/tongue/throat, SOB, or low BP? Yes Did it involve sudden or severe rash/hives, skin peeling, or any reaction on the inside of your mouth or nose? No Did you need to seek medical attention at a hospital or doctor's office? Yes When did it last happen?childhood allergy If all above answers are "NO", may proceed with cephalosporin use.     Family History: Family History  Problem Relation Age of Onset  . Hypertrophic cardiomyopathy Mother   . Bradycardia Mother   . Healthy Father     Social History: Social History   Socioeconomic History  . Marital status: Single    Spouse name: Not on file  . Number of children: 2  . Years of education: Not on file  . Highest education level: Some college, no degree  Occupational  History  . Occupation: not working  Tobacco Use  . Smoking status: Former Smoker    Packs/day: 1.00    Years: 1.00    Pack years: 1.00    Types: Cigarettes    Quit date: 11/22/2017    Years since quitting: 1.6  . Smokeless tobacco: Never Used  Substance and Sexual Activity  . Alcohol use: Never  . Drug use: Never  . Sexual activity: Yes    Birth control/protection: None  Other Topics Concern  . Not on file  Social History Narrative   Right handed   Two story    No caffeine, maybe coffee once a week   Social Determinants of Health   Financial Resource Strain:   . Difficulty of Paying Living Expenses:   Food Insecurity:   . Worried About Charity fundraiser in the Last Year:   . Arboriculturist in the Last Year:   Transportation Needs:   . Film/video editor (Medical):   Christina Morton Lack of Transportation (Non-Medical):   Physical Activity:   . Days of Exercise per Week:   . Minutes of Exercise per Session:   Stress:   . Feeling of Stress :   Social Connections:   .  Frequency of Communication with Friends and Family:   . Frequency of Social Gatherings with Friends and Family:   . Attends Religious Services:   . Active Member of Clubs or Organizations:   . Attends Banker Meetings:   Christina Morton Marital Status:   Intimate Partner Violence: Not At Risk  . Fear of Current or Ex-Partner: No  . Emotionally Abused: No  . Physically Abused: No  . Sexually Abused: No    Observations/Objective:   Height 5\' 6"  (1.676 m), weight 262 lb (118.8 kg), unknown if currently breastfeeding. No acute distress.  Alert and oriented.  Speech fluent and not dysarthric.  Language intact.  Eyes orthophoric on primary gaze.  Face symmetric.  Assessment and Plan:   1.  Migraine without aura, without status migrainosus, not intractable, much improved on topiramate 2.   Morbid obesity (BMI 42.29)  1.  For preventative management, topiramate 50mg  at bedtime 2.  For abortive therapy,  rizatriptan 10mg  3.  Limit use of pain relievers to no more than 2 days out of week to prevent risk of rebound or medication-overuse headache. 4.  Keep headache diary 5.  Exercise, diet/weight loss, hydration, caffeine cessation, sleep hygiene, monitor for and avoid triggers 6.  Follow up 6 months   Follow Up Instructions:    -I discussed the assessment and treatment plan with the patient. The patient was provided an opportunity to ask questions and all were answered. The patient agreed with the plan and demonstrated an understanding of the instructions.   The patient was advised to call back or seek an in-person evaluation if the symptoms worsen or if the condition fails to improve as anticipated.     , DO

## 2019-07-07 ENCOUNTER — Telehealth (INDEPENDENT_AMBULATORY_CARE_PROVIDER_SITE_OTHER): Payer: Medicaid Other | Admitting: Neurology

## 2019-07-07 ENCOUNTER — Encounter: Payer: Self-pay | Admitting: Neurology

## 2019-07-07 ENCOUNTER — Other Ambulatory Visit: Payer: Self-pay

## 2019-07-07 VITALS — Ht 66.0 in | Wt 262.0 lb

## 2019-07-07 DIAGNOSIS — G43009 Migraine without aura, not intractable, without status migrainosus: Secondary | ICD-10-CM

## 2019-07-09 ENCOUNTER — Ambulatory Visit (INDEPENDENT_AMBULATORY_CARE_PROVIDER_SITE_OTHER)
Admission: RE | Admit: 2019-07-09 | Discharge: 2019-07-09 | Disposition: A | Discharge status: Home / Self Care | Payer: Medicaid Other | Source: Ambulatory Visit

## 2019-07-09 DIAGNOSIS — R399 Unspecified symptoms and signs involving the genitourinary system: Secondary | ICD-10-CM

## 2019-07-09 MED ORDER — PHENAZOPYRIDINE HCL 200 MG PO TABS: 200.0000 mg | ORAL_TABLET | Freq: Three times a day (TID) | ORAL | 0 refills | Status: DC

## 2019-07-09 MED ORDER — NITROFURANTOIN MONOHYD MACRO 100 MG PO CAPS: 100.0000 mg | ORAL_CAPSULE | Freq: Two times a day (BID) | ORAL | 0 refills | Status: DC

## 2019-07-09 NOTE — ED Provider Notes (Signed)
Virtual Visit via Video Note:  Christina Morton  initiated request for Telemedicine visit with Christina Morton. I connected with Christina Morton  on 07/09/2019 at 12:55 PM  for a synchronized telemedicine visit using a video enabled HIPPA compliant telemedicine application. I verified that I am speaking with Christina Morton  using two identifiers. Christina Morton Christina Cipro, Christina Morton  was physically located in a Christina Morton and Christina Morton was located at a different location.   The limitations of evaluation and management by telemedicine as well as the availability of in-person appointments were discussed. Patient was informed that she  may incur a bill ( including co-pay) for this virtual visit encounter. Christina Morton  expressed understanding and gave verbal consent to proceed with virtual visit.     History of Present Illness:Christina Morton  is a 34 y.o. female presents with 1 day history of cloudy urine, urinary frequency, dysuria. Denies hematuria. Denies abdominal pain, nausea, vomiting, diarrhea. Denies fever, chills, flank pain. Denies vaginal symptoms, such as discharge, itching, bleeding/spotting. IUD.   Past Medical History:  Diagnosis Date  . Anemia   . Depression   . Glaucoma   . History of cesarean section, classical 01/21/2018   Low transverse with 5 cm extension into muscular layer--was told at [facility] risk of rupture--will treat as prior classical  . Mental disorder   . Type 2 diabetes mellitus without complications ([place])   . 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 Amoxicillin Anaphylaxis    Did it involve swelling of the face/tongue/throat, SOB, or low BP? Yes Did it involve sudden or severe rash/hives, skin peeling, or any reaction on the inside of your mouth or nose? No Did you need to seek medical attention at a hospital or doctor's office? Yes When did it last happen?childhood allergy If  all above answers are "NO", may proceed with cephalosporin use.  [name] Penicillins Anaphylaxis    Did it involve swelling of the face/tongue/throat, SOB, or low BP? Yes Did it involve sudden or severe rash/hives, skin peeling, or any reaction on the inside of your mouth or nose? No Did you need to seek medical attention at a hospital or doctor's office? Yes When did it last happen?childhood allergy If all above answers are "NO", may proceed with cephalosporin use.         Observations/Objective: General: Well appearing, nontoxic, no acute distress. Sitting comfortably, driving Head: Normocephalic, atraumatic Eye: No conjunctival injection, eyelid swelling. EOMI ENT: Mucus membranes moist, no lip cracking. No obvious nasal drainage. Pulm: Speaking in full sentences without difficulty. Normal effort. No respiratory distress, accessory muscle use. Neuro: Normal mental status. Alert and oriented x 3.   Assessment and Plan: We will treat clinically for cystitis based off symptoms.  Start macrobid as directed.  Push fluids.  Discussed with patient, if symptoms does not improve, or returns after course of antibiotics, will need to be seen in person.  Otherwise return precautions given.  Patient expresses understanding and agrees to plan.   Follow Up Instructions:    I discussed the assessment and treatment plan with the patient. The patient was provided an opportunity to ask questions and all were answered. The patient agreed with the plan and demonstrated an understanding of the instructions.   The patient was advised to call back or seek an in-person evaluation if the symptoms worsen or if the condition fails to  improve as anticipated.  I provided 7 minutes of non-face-to-face time during this encounter.    Belinda Fisher, Christina Morton  07/09/2019 12:55 PM         Belinda Fisher, Christina Morton 07/09/19 1303

## 2019-07-09 NOTE — Discharge Instructions (Addendum)
As discussed, treating you clinically for urinary tract infection based off of your symptoms.  Start Macrobid as directed. Pyridium if you develop painful urination. Keep hydrated, urine should be clear to pale yellow in color.  If symptoms not improving, or returns after you finish your antibiotics, will need to be evaluated in person. Monitor for any worsening of symptoms, fever, worsening abdominal pain, nausea/vomiting, flank pain, follow up for reevaluation.    [facility] at [facility]) [address] [place], [place], [place] [address] [phone]  [facility] Ur[facility] Care at [facility] [address] [place], [place] [address] [phone]  [facility] at [facility] [address] [place], [place] [address] [phone]  [facility] at [facility] [address], [place], [place] [address] [phone]  [facility] Urgent Care at [facility] [address] [place]nie [place], [place] [address] [phone]  [facility] at [facility] [address], [place], [place] [address] [phone]

## 2019-07-19 ENCOUNTER — Ambulatory Visit (INDEPENDENT_AMBULATORY_CARE_PROVIDER_SITE_OTHER)
Admission: RE | Admit: 2019-07-19 | Discharge: 2019-07-19 | Disposition: A | Discharge status: Home / Self Care | Payer: Medicaid Other | Source: Ambulatory Visit

## 2019-07-19 DIAGNOSIS — B379 Candidiasis, unspecified: Secondary | ICD-10-CM

## 2019-07-19 DIAGNOSIS — N898 Other specified noninflammatory disorders of vagina: Secondary | ICD-10-CM

## 2019-07-19 MED ORDER — FLUCONAZOLE 150 MG PO TABS: 150.0000 mg | ORAL_TABLET | Freq: Every day | ORAL | 0 refills | Status: DC

## 2019-07-19 NOTE — Discharge Instructions (Addendum)
Given you were recently on antibiotics, vaginal itching most likely caused by yeast infection. Start diflucan as directed. Avoid new hygiene product. Follow up for in person evaluation if urinary symptoms continue, or if vaginal symptoms does not resolve. Also follow up in person if develop fever, abdominal pain, nausea/vomiting.  [facility] at [facility]) [address] [place], [place], [place] [address] [phone]  [facility] at [facility] [address] [place], [place] [address] [phone]  [facility] Care at [facility] [address] [place], [place] [address] [phone]  [facility] Urgent Care at [facility] [address], [place], [place] [address] [phone]  [facility] Urgent Care at [facility] [address] [place], [place] [address] [phone]  [facility] Urgent Care at [facility] [address], [place], [place] [address] [phone]

## 2019-07-19 NOTE — ED Provider Notes (Signed)
Virtual Visit via Video Note:  Christina Morton  initiated request for Telemedicine visit with [facility] Urgent Care team. I connected with Christina Morton  on 07/19/2019 at 8:21 AM  for a synchronized telemedicine visit using a video enabled HIPPA compliant telemedicine application. I verified that I am speaking with Christina Morton  using two identifiers. Christina Morton Christina Cipro, Christina Morton  was physically located in a [facility] Urgent care site and Christina Morton was located at a different location.   The limitations of evaluation and management by telemedicine as well as the availability of in-person appointments were discussed. Patient was informed that she  may incur a bill ( including co-pay) for this virtual visit encounter. Christina Morton  expressed understanding and gave verbal consent to proceed with virtual visit.   History of Present Illness:Christina Morton  is a 34 y.o. female presents with 2 day of vaginal itching. No vaginal discharge. Urinary symptoms has improved, but not completely resolved. Denies fevers. Denies abdominal pain, nausea, vomiting. IUD. Was treated for UTI clinically 07/09/2019, finished abx 4-5 days ago.    Past Medical History:  Diagnosis Date  . Anemia   . Depression   . Glaucoma   . History of cesarean section, classical 01/21/2018   Low transverse with 5 cm extension into muscular layer--was told at [facility] risk of rupture--will treat as prior classical  . Mental disorder   . Type 2 diabetes mellitus without complications ([place])   . 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 Amoxicillin Anaphylaxis    Did it involve swelling of the face/tongue/throat, SOB, or low BP? Yes Did it involve sudden or severe rash/hives, skin peeling, or any reaction on the inside of your mouth or nose? No Did you need to seek medical attention at a hospital or doctor's office? Yes When did it last happen?childhood allergy If all  above answers are "NO", may proceed with cephalosporin use.  [name] Penicillins Anaphylaxis    Did it involve swelling of the face/tongue/throat, SOB, or low BP? Yes Did it involve sudden or severe rash/hives, skin peeling, or any reaction on the inside of your mouth or nose? No Did you need to seek medical attention at a hospital or doctor's office? Yes When did it last happen?childhood allergy If all above answers are "NO", may proceed with cephalosporin use.         Observations/Objective: General: Well appearing, nontoxic, no acute distress. Sitting comfortably. Head: Normocephalic, atraumatic Eye: No conjunctival injection, eyelid swelling. EOMI ENT: Mucus membranes moist, no lip cracking. No obvious nasal drainage. Pulm: Speaking in full sentences without difficulty. Normal effort. No respiratory distress, accessory muscle use. Neuro: Normal mental status. Alert and oriented x 3.   Assessment and Plan: Given recently finished antibiotic, now with vaginal itching, will treat for yeast vaginitis.  Patient still with residual urinary symptoms, though improved from last visit.  Discussed will need follow-up in office for dipstick for further evaluation.  Return precautions given.  Patient expresses understanding and agrees to plan.  Follow Up Instructions:    I discussed the assessment and treatment plan with the patient. The patient was provided an opportunity to ask questions and all were answered. The patient agreed with the plan and demonstrated an understanding of the instructions.   The patient was advised to call back or seek an in-person evaluation if the symptoms worsen or if the condition fails to improve as  anticipated.  I provided 15 minutes of non-face-to-face time during this encounter.    Christina Fisher, Christina Morton  07/19/2019 8:21 AM         Christina Fisher, Christina Morton 07/19/19 [phone]

## 2019-08-04 ENCOUNTER — Other Ambulatory Visit: Payer: Self-pay

## 2019-08-04 ENCOUNTER — Other Ambulatory Visit: Payer: Self-pay | Admitting: Family Medicine

## 2019-08-04 ENCOUNTER — Other Ambulatory Visit (INDEPENDENT_AMBULATORY_CARE_PROVIDER_SITE_OTHER): Payer: Medicaid Other

## 2019-08-04 DIAGNOSIS — E1169 Type 2 diabetes mellitus with other specified complication: Secondary | ICD-10-CM | POA: Diagnosis not present

## 2019-08-04 DIAGNOSIS — E669 Obesity, unspecified: Secondary | ICD-10-CM | POA: Diagnosis not present

## 2019-08-04 LAB — POCT GLYCOSYLATED HEMOGLOBIN (HGB A1C): HbA1c, POC (controlled diabetic range): 6.5 % (ref 0.0–7.0)

## 2019-08-05 LAB — BASIC METABOLIC PANEL
BUN/Creatinine Ratio: 10 (ref 9–23)
BUN: 9 mg/dL (ref 6–20)
CO2: 18 mmol/L — ABNORMAL LOW (ref 20–29)
Calcium: 9.6 mg/dL (ref 8.7–10.2)
Chloride: 107 mmol/L — ABNORMAL HIGH (ref 96–106)
Creatinine, Ser: 0.89 mg/dL (ref 0.57–1.00)
GFR calc Af Amer: 98 mL/min/{1.73_m2} (ref >59)
GFR calc non Af Amer: 85 mL/min/{1.73_m2} (ref >59)
Glucose: 108 mg/dL — ABNORMAL HIGH (ref 65–99)
Potassium: 4.2 mmol/L (ref 3.5–5.2)
Sodium: 139 mmol/L (ref 134–144)

## 2019-08-05 LAB — LIPID PANEL
Chol/HDL Ratio: 4.7 ratio — ABNORMAL HIGH (ref 0.0–4.4)
Cholesterol, Total: 169 mg/dL (ref 100–199)
HDL: 36 mg/dL — ABNORMAL LOW (ref >39)
LDL Chol Calc (NIH): 119 mg/dL — ABNORMAL HIGH (ref 0–99)
Triglycerides: 73 mg/dL (ref 0–149)
VLDL Cholesterol Cal: 14 mg/dL (ref 5–40)

## 2019-08-08 ENCOUNTER — Encounter: Payer: Self-pay | Admitting: Family Medicine

## 2019-08-09 ENCOUNTER — Other Ambulatory Visit: Payer: Self-pay

## 2019-08-09 ENCOUNTER — Telehealth (INDEPENDENT_AMBULATORY_CARE_PROVIDER_SITE_OTHER): Payer: Medicaid Other | Admitting: Family Medicine

## 2019-08-09 ENCOUNTER — Encounter: Payer: Self-pay | Admitting: Family Medicine

## 2019-08-09 DIAGNOSIS — E669 Obesity, unspecified: Secondary | ICD-10-CM

## 2019-08-09 DIAGNOSIS — G4700 Insomnia, unspecified: Secondary | ICD-10-CM

## 2019-08-09 DIAGNOSIS — E1169 Type 2 diabetes mellitus with other specified complication: Secondary | ICD-10-CM

## 2019-08-09 MED ORDER — ESZOPICLONE 1 MG PO TABS: 1.0000 mg | ORAL_TABLET | Freq: Every evening | ORAL | 1 refills | Status: DC | PRN

## 2019-08-09 NOTE — Progress Notes (Signed)
[facility] Telemedicine Visit  Patient consented to have virtual visit. Method of visit: Video  Encounter participants: Patient: Josanne Boerema - located at home Provider: [place] Taytum Wheller - located at [facility]  Others (if applicable): none  Chief Complaint: insomnia  HPI:  Insomnia: Sleeping 3-5 hours and sometimes none. Not taking prozac or any other behavioral medications. She is still in therapy currently and sees her tomorrow. She has tried hydroxyzine 10 mg, melatonin, trazodone.  She was previously given short course of Ambien which she reported she did not use every night but it did help her with her sleep.  However this is not an long-term medication.  Patient calling today in order to see if there is any further interventions.  Patient reports that she has been working on her sleep hygiene and does not drink any caffeine.  She reports that she is still quite tired.  Diabetes: Medications: taking once a day and not taking every day Hypoglycemic symptoms: no Statin not indicated due to age Last foot exam: up to date ROS: denies dizziness, diaphoresis, LOC, polyuria, polydipsia  PERTINENT  PMH / PSH: Insomnia, T2DM, anxiety/depression  Exam:  General: well appearing Respiratory: able to speak in complete sentences without issue  Assessment/Plan:  Insomnia Discussed other options for insomnia and encouraged again proper sleep hygiene.  Patient had success with Ambien however this is not indicated long-term is able to have other options.  We will trial eszopiclone nightly as needed sleep as it can be more safe for slightly longer duration.  Patient is to continue following up with her therapist and hopefully they can find some sort of answer in the meantime.  This medication is not currently covered by Medicaid but will attempt a prior authorization given patient's multiple failed medications.  Patient to use good Rx in the meantime and will reach out to let me  know if this medication is not successful for helping her sleep.  Diabetes mellitus type 2 in obese (HCC) A1c remains well controlled, will continue with Metformin 500 mg daily. Statin therapy discussed but patient would like to try lifestyle changes given age <51yr.   Time spent during visit with patient: 22 minutes  [place] Mileydi Milsap, DO PGY-3, [facility]

## 2019-08-11 ENCOUNTER — Telehealth: Payer: Self-pay | Admitting: *Deleted

## 2019-08-11 MED ORDER — TOPIRAMATE 50 MG PO TABS: ORAL_TABLET | ORAL | Status: AC

## 2019-08-11 NOTE — Assessment & Plan Note (Signed)
Discussed other options for insomnia and encouraged again proper sleep hygiene.  Patient had success with Ambien however this is not indicated long-term is able to have other options.  We will trial eszopiclone nightly as needed sleep as it can be more safe for slightly longer duration.  Patient is to continue following up with her therapist and hopefully they can find some sort of answer in the meantime.  This medication is not currently covered by Medicaid but will attempt a prior authorization given patient's multiple failed medications.  Patient to use good Rx in the meantime and will reach out to let me know if this medication is not successful for helping her sleep.

## 2019-08-11 NOTE — Telephone Encounter (Signed)
PA completed and placed in Dr. Donnetta Hail box for attending signature.

## 2019-08-11 NOTE — Telephone Encounter (Signed)
Lunesta not covered by Medicaid.  Please see below of formulary.  Let "RN Team" know if you are changing to covered medications or would like to pursue a PA. Jone Baseman, CMA

## 2019-08-11 NOTE — Assessment & Plan Note (Addendum)
A1c remains well controlled, will continue with Metformin 500 mg daily. Statin therapy discussed but patient would like to try lifestyle changes given age <25yr.

## 2019-08-11 NOTE — Telephone Encounter (Signed)
I would like to do a PA

## 2019-08-11 NOTE — Telephone Encounter (Signed)
PA form placed in your box. Christina Morton, CMA

## 2019-08-14 ENCOUNTER — Other Ambulatory Visit: Payer: Self-pay

## 2019-08-14 ENCOUNTER — Ambulatory Visit
Admission: EM | Admit: 2019-08-14 | Discharge: 2019-08-14 | Disposition: A | Discharge status: Home / Self Care | Payer: Medicaid Other | Attending: Emergency Medicine | Admitting: Emergency Medicine

## 2019-08-14 ENCOUNTER — Telehealth: Payer: Medicaid Other

## 2019-08-14 ENCOUNTER — Encounter: Payer: Self-pay | Admitting: Emergency Medicine

## 2019-08-14 DIAGNOSIS — N760 Acute vaginitis: Secondary | ICD-10-CM | POA: Diagnosis not present

## 2019-08-14 DIAGNOSIS — N3001 Acute cystitis with hematuria: Secondary | ICD-10-CM | POA: Diagnosis not present

## 2019-08-14 LAB — POCT URINALYSIS DIP (MANUAL ENTRY)
Bilirubin, UA: NEGATIVE
Glucose, UA: NEGATIVE mg/dL
Ketones, POC UA: NEGATIVE mg/dL
Nitrite, UA: NEGATIVE
Protein Ur, POC: 100 mg/dL — AB
Spec Grav, UA: >=1.03 — AB (ref 1.010–1.025)
Urobilinogen, UA: 1 E.U./dL
pH, UA: 5.5 (ref 5.0–8.0)

## 2019-08-14 MED ORDER — NITROFURANTOIN MONOHYD MACRO 100 MG PO CAPS
100.0000 mg | ORAL_CAPSULE | Freq: Two times a day (BID) | ORAL | 0 refills | Status: DC
Start: 2019-08-14 — End: 2020-03-04

## 2019-08-14 MED ORDER — FLUCONAZOLE 150 MG PO TABS: 150.0000 mg | ORAL_TABLET | Freq: Every day | ORAL | 0 refills | Status: DC

## 2019-08-14 NOTE — ED Provider Notes (Addendum)
[name]    CSN: [id number] Arrival date & time: 08/14/19  1531      History   Chief Complaint Chief Complaint  Patient presents with  . Urinary Tract Infection    HPI Christina Morton is a 34 y.o. female with history of obesity, diabetes, recurrent UTI presenting for acute vaginitis, UTI.  States she developed urinary frequency, dysuria this morning.  Also noted thick, white vaginal discharge with pruritus.  Denies polydipsia, polyuria.  States her glucose readings at home have been 90-140.  No back or belly pain, fever.   Past Medical History:  Diagnosis Date  . Anemia   . Depression   . Glaucoma   . History of cesarean section, classical 01/21/2018   Low transverse with 5 cm extension into muscular layer--was told at [facility] risk of rupture--will treat as prior classical  . Mental disorder   . Type 2 diabetes mellitus without complications ([place])   . 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 Insomnia 01/23/2019  . Cervical radiculopathy 12/08/2018  . 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 Depression 03/23/2018  . Glaucoma 03/23/2018  . Family history of heart disease 01/21/2018  . Diabetes mellitus type 2 in obese ([place]) 01/21/2018    Past Surgical History:  Procedure Laterality Date  . CESAREAN SECTION    . CESAREAN SECTION WITH BILATERAL TUBAL LIGATION Bilateral 07/21/2018   Procedure: CESAREAN SECTION WITH BILATERAL TUBAL LIGATION;  Surgeon: Gwynne Edinger, MD;  Location: [facility] LD ORS;  Service: Obstetrics;  Laterality: Bilateral;  . CYSTECTOMY      OB History    Gravida  2   Para  2   Term  2   Preterm  0   AB  0   Living  2     SAB  0   TAB  0   Ectopic  0   Multiple  0   Live Births  2            Home Medications    Prior to Admission medications   Medication Sig Start Date End Date Taking? Authorizing Provider   famotidine (PEPCID) 20 MG tablet Take 1 tablet (20 mg total) by mouth at bedtime. 12/08/18  Yes Enid Derry, [place], DO  metFORMIN (GLUCOPHAGE-XR) 500 MG 24 hr tablet Take 1 tablet (500 mg total) by mouth 2 (two) times daily with a meal. Take pill with high-protein meal. 03/01/19  Yes Enid Derry, [place], DO  topiramate (TOPAMAX) 50 MG tablet Take 25mg  at hs for one week and then 50mg  at thereforth at hs 08/11/19  Yes Enid Derry, [place], DO  eszopiclone (LUNESTA) 1 MG TABS tablet Take 1 tablet (1 mg total) by mouth at bedtime as needed for sleep. Take immediately before bedtime 08/09/19   Shirley, [place], DO  fluconazole (DIFLUCAN) 150 MG tablet Take 1 tablet (150 mg total) by mouth daily. May repeat in 72 hours if needed 08/14/19   Hall-Potvin, [place], PA-C  nitrofurantoin, macrocrystal-monohydrate, (MACROBID) 100 MG capsule Take 1 capsule (100 mg total) by mouth 2 (two) times daily. 08/14/19   Hall-Potvin, [place], PA-C  rizatriptan (MAXALT) 10 MG tablet Take 1 tablet earliest onset of migraine.  May repeat in 2 hours if needed.  Maximum 2 tablets in 24 hours 04/08/19   Metta Clines R, DO  zolpidem (AMBIEN) 5 MG tablet Take 1 tablet (5 mg total) by mouth at bedtime.  05/04/19 07/09/19  Nestor Ramp, MD    Family History Family History  Problem Relation Age of Onset  . Hypertrophic cardiomyopathy Mother   . Bradycardia Mother   . Healthy Father     Social History Social History   Tobacco Use  . Smoking status: Former Smoker    Packs/day: 1.00    Years: 1.00    Pack years: 1.00    Types: Cigarettes    Quit date: 11/22/2017    Years since quitting: 1.7  . Smokeless tobacco: Never Used  Substance Use Topics  . Alcohol use: Never  . Drug use: Never     Allergies   Amoxicillin and Penicillins   Review of Systems As per HPI   Physical Exam Triage Vital Signs ED Triage Vitals [08/14/19 1541]  Enc Vitals Group     BP      Pulse      Resp      Temp      Temp src      SpO2      Weight       Height      Head Circumference      Peak Flow      Pain Score 3     Pain Loc      Pain Edu?      Excl. in GC?    No data found.  Updated Vital Signs BP 106/73 (BP Location: Left Arm)   Pulse 100   Temp 98.7 F (37.1 C) (Oral)   Resp 20   SpO2 97%   Visual Acuity Right Eye Distance:   Left Eye Distance:   Bilateral Distance:    Right Eye Near:   Left Eye Near:    Bilateral Near:     Physical Exam Constitutional:      General: She is not in acute distress. HENT:     Head: Normocephalic and atraumatic.  Eyes:     General: No scleral icterus.    Pupils: Pupils are equal, round, and reactive to light.  Cardiovascular:     Rate and Rhythm: Normal rate.  Pulmonary:     Effort: Pulmonary effort is normal.  Abdominal:     General: Bowel sounds are normal.     Palpations: Abdomen is soft.     Tenderness: There is no abdominal tenderness. There is no right CVA tenderness, left CVA tenderness or guarding.  Skin:    Coloration: Skin is not jaundiced or pale.  Neurological:     Mental Status: She is alert and oriented to person, place, and time.      UC Treatments / Results  Labs (all labs ordered are listed, but only abnormal results are displayed) Labs Reviewed  POCT URINALYSIS DIP (MANUAL ENTRY) - Abnormal; Notable for the following components:      Result Value   Clarity, UA cloudy (*)    Spec Grav, UA >=1.030 (*)    Blood, UA moderate (*)    Protein Ur, POC =100 (*)    Leukocytes, UA Trace (*)    All other components within normal limits  URINE CULTURE    EKG   Radiology No results found.  Procedures Procedures (including critical care time)  Medications Ordered in UC Medications - No data to display  Initial Impression / Assessment and Plan / UC Course  I have reviewed the triage vital signs and the nursing notes.  Pertinent labs & imaging results that were available during my care of the patient  were reviewed by me and considered in my  medical decision making (see chart for details).     Patient febrile, nontoxic in office today.  Urine dipstick significant for trace leukocytes, protein, moderate blood, elevated specific gravity.  Culture pending.  Will start Macrobid today which patient has tolerated well in the past.  Diflucan today, as well as with last tab of antibiotic.  Return precautions discussed, patient verbalized understanding and is agreeable to plan. Final Clinical Impressions(s) / UC Diagnoses   Final diagnoses:  Acute vaginitis  Acute cystitis with hematuria     Discharge Instructions     Take antibiotic twice daily with food. Take Diflucan once a day, the other tab with your last pill of antibiotic. Important to drink plenty of water throughout the day. Return for worsening urinary symptoms, blood in urine, abdominal or back pain, fever.    ED Prescriptions    Medication Sig Dispense Auth. Provider   fluconazole (DIFLUCAN) 150 MG tablet Take 1 tablet (150 mg total) by mouth daily. May repeat in 72 hours if needed 2 tablet Hall-Potvin, [place], PA-C   nitrofurantoin, macrocrystal-monohydrate, (MACROBID) 100 MG capsule Take 1 capsule (100 mg total) by mouth 2 (two) times daily. 10 capsule Hall-Potvin, [place], PA-C     PDMP not reviewed this encounter.   Hall-Potvin, [place], PA-C 08/14/19 1555    Hall-Potvin, [place], [place] 08/14/19 1634

## 2019-08-14 NOTE — Discharge Instructions (Addendum)
Take antibiotic twice daily with food. Take Diflucan once a day, the other tab with your last pill of antibiotic. Important to drink plenty of water throughout the day. Return for worsening urinary symptoms, blood in urine, abdominal or back pain, fever.

## 2019-08-14 NOTE — ED Triage Notes (Signed)
Symptoms started this morning.  Patient reports frequent urination, vaginal discharge (creamy , white).  No burn with urination, but reports a tingle at end of urinary stream.  Denies abdominal pain or back pain

## 2019-08-16 LAB — URINE CULTURE: Culture: <10000 — AB

## 2019-08-26 NOTE — Telephone Encounter (Signed)
Form received back from [place] with signature. Form faxed to appropriate number. Copy in nurse room.

## 2019-11-25 ENCOUNTER — Other Ambulatory Visit: Payer: Self-pay

## 2019-11-25 ENCOUNTER — Ambulatory Visit (INDEPENDENT_AMBULATORY_CARE_PROVIDER_SITE_OTHER): Payer: Medicaid Other | Admitting: Family Medicine

## 2019-11-25 ENCOUNTER — Encounter: Payer: Self-pay | Admitting: Family Medicine

## 2019-11-25 VITALS — BP 98/60 | HR 98 | Wt 259.0 lb

## 2019-11-25 DIAGNOSIS — E669 Obesity, unspecified: Secondary | ICD-10-CM

## 2019-11-25 DIAGNOSIS — Z0000 Encounter for general adult medical examination without abnormal findings: Secondary | ICD-10-CM | POA: Diagnosis not present

## 2019-11-25 DIAGNOSIS — E1169 Type 2 diabetes mellitus with other specified complication: Secondary | ICD-10-CM

## 2019-11-25 LAB — POCT GLYCOSYLATED HEMOGLOBIN (HGB A1C): HbA1c, POC (prediabetic range): 6.4 % (ref 5.7–6.4)

## 2019-11-25 MED ORDER — METFORMIN HCL ER (MOD) 1000 MG PO TB24: 1000.0000 mg | ORAL_TABLET | Freq: Two times a day (BID) | ORAL | 0 refills | Status: DC

## 2019-11-25 NOTE — Patient Instructions (Signed)
It was great to see you!  Your A1c today was 6.4%, keep up the good work. We will increase metformin to 1000mg  twice a day. I also recommend you see a nutritionist. They will call you to schedule an appointment.  We will follow up in 3 months otherwise, call to schedule an appointment in in November. If you need anything before then, don't hesitate to call.  Be Well,  Dr. December   Diabetes Mellitus and Nutrition, Adult When you have diabetes (diabetes mellitus), it is very important to have healthy eating habits because your blood sugar (glucose) levels are greatly affected by what you eat and drink. Eating healthy foods in the appropriate amounts, at about the same times every day, can help you:  Control your blood glucose.  Lower your risk of heart disease.  Improve your blood pressure.  Reach or maintain a healthy weight. Every person with diabetes is different, and each person has different needs for a meal plan. Your health care provider may recommend that you work with a diet and nutrition specialist (dietitian) to make a meal plan that is best for you. Your meal plan may vary depending on factors such as:  The calories you need.  The medicines you take.  Your weight.  Your blood glucose, blood pressure, and cholesterol levels.  Your activity level.  Other health conditions you have, such as heart or kidney disease. How do carbohydrates affect me? Carbohydrates, also called carbs, affect your blood glucose level more than any other type of food. Eating carbs naturally raises the amount of glucose in your blood. Carb counting is a method for keeping track of how many carbs you eat. Counting carbs is important to keep your blood glucose at a healthy level, especially if you use insulin or take certain oral diabetes medicines. It is important to know how many carbs you can safely have in each meal. This is different for every person. Your dietitian can help you calculate how  many carbs you should have at each meal and for each snack. Foods that contain carbs include:  Bread, cereal, rice, pasta, and crackers.  Potatoes and corn.  Peas, beans, and lentils.  Milk and yogurt.  Fruit and juice.  Desserts, such as cakes, cookies, ice cream, and candy. How does alcohol affect me? Alcohol can cause a sudden decrease in blood glucose (hypoglycemia), especially if you use insulin or take certain oral diabetes medicines. Hypoglycemia can be a life-threatening condition. Symptoms of hypoglycemia (sleepiness, dizziness, and confusion) are similar to symptoms of having too much alcohol. If your health care provider says that alcohol is safe for you, follow these guidelines:  Limit alcohol intake to no more than 1 drink per day for nonpregnant women and 2 drinks per day for men. One drink equals 12 oz of beer, 5 oz of wine, or 1 oz of hard liquor.  Do not drink on an empty stomach.  Keep yourself hydrated with water, diet soda, or unsweetened iced tea.  Keep in mind that regular soda, juice, and other mixers may contain a lot of sugar and must be counted as carbs. What are tips for following this plan?  Reading food labels  Start by checking the serving size on the "Nutrition Facts" label of packaged foods and drinks. The amount of calories, carbs, fats, and other nutrients listed on the label is based on one serving of the item. Many items contain more than one serving per package.  Check the total grams (  g) of carbs in one serving. You can calculate the number of servings of carbs in one serving by dividing the total carbs by 15. For example, if a food has 30 g of total carbs, it would be equal to 2 servings of carbs.  Check the number of grams (g) of saturated and trans fats in one serving. Choose foods that have low or no amount of these fats.  Check the number of milligrams (mg) of salt (sodium) in one serving. Most people should limit total sodium intake to  less than 2,300 mg per day.  Always check the nutrition information of foods labeled as "low-fat" or "nonfat". These foods may be higher in added sugar or refined carbs and should be avoided.  Talk to your dietitian to identify your daily goals for nutrients listed on the label. Shopping  Avoid buying canned, premade, or processed foods. These foods tend to be high in fat, sodium, and added sugar.  Shop around the outside edge of the grocery store. This includes fresh fruits and vegetables, bulk grains, fresh meats, and fresh dairy. Cooking  Use low-heat cooking methods, such as baking, instead of high-heat cooking methods like deep frying.  Cook using healthy oils, such as olive, canola, or sunflower oil.  Avoid cooking with butter, cream, or high-fat meats. Meal planning  Eat meals and snacks regularly, preferably at the same times every day. Avoid going long periods of time without eating.  Eat foods high in fiber, such as fresh fruits, vegetables, beans, and whole grains. Talk to your dietitian about how many servings of carbs you can eat at each meal.  Eat 4-6 ounces (oz) of lean protein each day, such as lean meat, chicken, fish, eggs, or tofu. One oz of lean protein is equal to: ? 1 oz of meat, chicken, or fish. ? 1 egg. ?  cup of tofu.  Eat some foods each day that contain healthy fats, such as avocado, nuts, seeds, and fish. Lifestyle  Check your blood glucose regularly.  Exercise regularly as told by your health care provider. This may include: ? 150 minutes of moderate-intensity or vigorous-intensity exercise each week. This could be brisk walking, biking, or water aerobics. ? Stretching and doing strength exercises, such as yoga or weightlifting, at least 2 times a week.  Take medicines as told by your health care provider.  Do not use any products that contain nicotine or tobacco, such as cigarettes and e-cigarettes. If you need help quitting, ask your health care  provider.  Work with a Veterinary surgeon or diabetes educator to identify strategies to manage stress and any emotional and social challenges. Questions to ask a health care provider  Do I need to meet with a diabetes educator?  Do I need to meet with a dietitian?  What number can I call if I have questions?  When are the best times to check my blood glucose? Where to find more information:  American Diabetes Association: diabetes.org  Academy of Nutrition and Dietetics: www.eatright.[employer] of Diabetes and Digestive and Kidney Diseases (NIH): CarFlippers.tn Summary  A healthy meal plan will help you control your blood glucose and maintain a healthy lifestyle.  Working with a diet and nutrition specialist (dietitian) can help you make a meal plan that is best for you.  Keep in mind that carbohydrates (carbs) and alcohol have immediate effects on your blood glucose levels. It is important to count carbs and to use alcohol carefully. This information is not intended  to replace advice given to you by your health care provider. Make sure you discuss any questions you have with your health care provider. Document Revised: 03/20/2017 Document Reviewed: 05/12/2016 Elsevier Patient Education  2020 [employer].

## 2019-11-26 ENCOUNTER — Encounter: Payer: Self-pay | Admitting: Family Medicine

## 2019-11-26 DIAGNOSIS — Z0000 Encounter for general adult medical examination without abnormal findings: Secondary | ICD-10-CM | POA: Insufficient documentation

## 2019-11-26 NOTE — Progress Notes (Signed)
    SUBJECTIVE:   CHIEF COMPLAINT / HPI: DM f/u  DM: - Patient does not check blood sugars daily - hgb A1c today 6.4%, 6.5% last check - Compliant with medication - Last 24 hour recall of diet: no breakfast, pizza for lunch, [employer] sandwich/fries/coke for dinner, night time snack pizza - No exercise other than taking care of kids, usual activities - Discussed seeing a nutritionist as patient wants to eat better, but struggles with how to incorporate those choices into busy lifestyle with 2 active kids, patient receptive to nutritional training  COVID-19 vaccine: patient is hesitant about getting the vaccine. She is worried that if she gets the vaccine, she may have unwanted side effects, and still get COVID-19 and get sick. We discussed the evidence behind the vaccine, actual risk of getting COVID-19 if vaccinated (~12% with Pfizer), and decreased risk of hospitalization and death if vaccinated. Patient states she will consider getting vaccinated.  PERTINENT  PMH / PSH: DMT2  OBJECTIVE:   BP 98/60   Pulse 98   Wt 259 lb (117.5 kg)   LMP 10/31/2019   SpO2 100%   BMI 41.80 kg/m   Physical Exam Vitals and nursing note reviewed.  Constitutional:      General: She is not in acute distress.    Appearance: Normal appearance. She is obese. She is not ill-appearing, toxic-appearing or diaphoretic.  HENT:     Head: Normocephalic and atraumatic.  Eyes:     Conjunctiva/sclera: Conjunctivae normal.  Cardiovascular:     Rate and Rhythm: Normal rate and regular rhythm.     Pulses: Normal pulses.     Heart sounds: Normal heart sounds. No murmur heard.  No friction rub. No gallop.   Pulmonary:     Effort: Pulmonary effort is normal.     Breath sounds: Normal breath sounds.  Abdominal:     General: Bowel sounds are normal.  Musculoskeletal:     Right lower leg: No edema.     Left lower leg: No edema.  Skin:    General: Skin is warm and dry.  Neurological:     General: No focal  deficit present.     Mental Status: She is alert. Mental status is at baseline.  Psychiatric:        Mood and Affect: Mood normal.        Behavior: Behavior normal.    ASSESSMENT/PLAN:   Diabetes mellitus type 2 in obese (HCC) Very compliant with medication, slight improvement in A1c. Patient has goals of getting in better health, would like to see a nutritionist. For weight loss goals and to improve A1c, recommend patient increase metformin, which she has tolerated well. Current Regimen: increase to metformin 1000mg  BID CBGs: does not check Last A1c: 6.4% on 11/25/19 Denies polyuria, polydipsia, hypoglycemia  Last Eye Exam: recommend getting done this year Statin: <26 years old, patient prefers to try lifestyle changes first ACE/ARB: no HTN and scr 0.89, will continue to monitor kidney function -Referral to diabetic nutrition counseling -Recommend walking for exercise    Healthcare maintenance Patient declines COVID-19 vaccine today. Discussed recommendations and effectiveness of the vaccine at length.     41, MD [facility]

## 2019-11-26 NOTE — Assessment & Plan Note (Signed)
Patient declines COVID-19 vaccine today. Discussed recommendations and effectiveness of the vaccine at length.

## 2019-11-26 NOTE — Assessment & Plan Note (Signed)
Very compliant with medication, slight improvement in A1c. Patient has goals of getting in better health, would like to see a nutritionist. For weight loss goals and to improve A1c, recommend patient increase metformin, which she has tolerated well. Current Regimen: increase to metformin 1000mg  BID CBGs: does not check Last A1c: 6.4% on 11/25/19 Denies polyuria, polydipsia, hypoglycemia  Last Eye Exam: recommend getting done this year Statin: <34 years old, patient prefers to try lifestyle changes first ACE/ARB: no HTN and scr 0.89, will continue to monitor kidney function -Referral to diabetic nutrition counseling -Recommend walking for exercise

## 2020-01-25 ENCOUNTER — Ambulatory Visit: Payer: Medicaid Other | Admitting: Registered"

## 2020-02-08 ENCOUNTER — Encounter: Payer: Medicaid Other | Attending: Family Medicine | Admitting: Registered"

## 2020-03-04 ENCOUNTER — Ambulatory Visit
Admission: EM | Admit: 2020-03-04 | Discharge: 2020-03-04 | Disposition: A | Discharge status: Home / Self Care | Payer: Medicaid Other | Attending: Emergency Medicine | Admitting: Emergency Medicine

## 2020-03-04 ENCOUNTER — Encounter: Payer: Self-pay | Admitting: Emergency Medicine

## 2020-03-04 ENCOUNTER — Other Ambulatory Visit: Payer: Self-pay

## 2020-03-04 DIAGNOSIS — N3001 Acute cystitis with hematuria: Secondary | ICD-10-CM | POA: Diagnosis present

## 2020-03-04 LAB — POCT URINALYSIS DIP (MANUAL ENTRY)
Glucose, UA: 250 mg/dL — AB
Nitrite, UA: POSITIVE — AB
Protein Ur, POC: >=300 mg/dL — AB
Spec Grav, UA: 1.015 (ref 1.010–1.025)
Urobilinogen, UA: >=8 E.U./dL — AB
pH, UA: 5 (ref 5.0–8.0)

## 2020-03-04 MED ORDER — NITROFURANTOIN MONOHYD MACRO 100 MG PO CAPS: 100.0000 mg | ORAL_CAPSULE | Freq: Two times a day (BID) | ORAL | 0 refills | Status: AC

## 2020-03-04 MED ORDER — FLUCONAZOLE 200 MG PO TABS: 200.0000 mg | ORAL_TABLET | Freq: Once | ORAL | 0 refills | Status: AC

## 2020-03-04 NOTE — ED Provider Notes (Signed)
[name]    CSN: [id number] Arrival date & time: 03/04/20  1105      History   Chief Complaint Chief Complaint  Patient presents with  . Dysuria    HPI Christina Morton is a 34 y.o. female  Christina Morton is a 34 y.o. female who complains of burning with urination, frequency and urgency for 3 days.  Patient denies back pain, fever and vaginal discharge.  Patient does have a history of recurrent UTI.  Patient does not have a history of pyelonephritis.  Has taken Azo without relief The following portions of the patient's history were reviewed and updated as appropriate: allergies, current medications, past family history, past medical history, past social history, past surgical history and problem list.     Past Medical History:  Diagnosis Date  . Anemia   . Depression   . Glaucoma   . History of cesarean section, classical 01/21/2018   Low transverse with 5 cm extension into muscular layer--was told at [facility] risk of rupture--will treat as prior classical  . Mental disorder   . Type 2 diabetes mellitus without complications (HCC)   . 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 Healthcare maintenance 11/26/2019  . Insomnia 01/23/2019  . Cervical radiculopathy 12/08/2018  . 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 Depression 03/23/2018  . Glaucoma 03/23/2018  . Family history of heart disease 01/21/2018  . Diabetes mellitus type 2 in obese (HCC) 01/21/2018    Past Surgical History:  Procedure Laterality Date  . CESAREAN SECTION    . CESAREAN SECTION WITH BILATERAL TUBAL LIGATION Bilateral 07/21/2018   Procedure: CESAREAN SECTION WITH BILATERAL TUBAL LIGATION;  Surgeon: Kathrynn Running, MD;  Location: [facility] LD ORS;  Service: Obstetrics;  Laterality: Bilateral;  . CYSTECTOMY      OB History    Gravida  2   Para  2   Term  2   Preterm  0   AB  0    Living  2     SAB  0   TAB  0   Ectopic  0   Multiple  0   Live Births  2            Home Medications    Prior to Admission medications   Medication Sig Start Date End Date Taking? Authorizing Provider  eszopiclone (LUNESTA) 1 MG TABS tablet Take 1 tablet (1 mg total) by mouth at bedtime as needed for sleep. Take immediately before bedtime 08/09/19   Shirley, [place], DO  famotidine (PEPCID) 20 MG tablet Take 1 tablet (20 mg total) by mouth at bedtime. 12/08/18   Shirley, [place], DO  fluconazole (DIFLUCAN) 200 MG tablet Take 1 tablet (200 mg total) by mouth once for 1 dose. May repeat in 72 hours if needed 03/04/20 03/04/20  Hall-Potvin, [place], PA-C  metFORMIN (GLUMETZA) 1000 MG (MOD) 24 hr tablet Take 1 tablet (1,000 mg total) by mouth in the morning and at bedtime. Take pill with high-protein meal. 11/25/19   Shirlean Mylar, MD  nitrofurantoin, macrocrystal-monohydrate, (MACROBID) 100 MG capsule Take 1 capsule (100 mg total) by mouth 2 (two) times daily for 3 days. 03/04/20 03/07/20  Hall-Potvin, [place], PA-C  rizatriptan (MAXALT) 10 MG tablet Take 1 tablet earliest onset of migraine.  May repeat in 2 hours if needed.  Maximum 2 tablets in 24 hours 04/08/19   Shon Millet  R, DO  topiramate (TOPAMAX) 50 MG tablet Take 25mg  at hs for one week and then 50mg  at thereforth at hs 08/11/19   , 08/13/19, DO  zolpidem (AMBIEN) 5 MG tablet Take 1 tablet (5 mg total) by mouth at bedtime. 05/04/19 07/09/19  05/06/19, MD    Family History Family History  Problem Relation Age of Onset  . Hypertrophic cardiomyopathy Mother   . Bradycardia Mother   . Healthy Father     Social History Social History   Tobacco Use  . Smoking status: Former Smoker    Packs/day: 1.00    Years: 1.00    Pack years: 1.00    Types: Cigarettes    Quit date: 11/22/2017    Years since quitting: 2.2  . Smokeless tobacco: Never Used  Vaping Use  . Vaping Use: Never used  Substance Use Topics  .  Alcohol use: Never  . Drug use: Never     Allergies   Amoxicillin and Penicillins   Review of Systems Review of Systems  Constitutional: Negative for fatigue and fever.  Respiratory: Negative for cough and shortness of breath.   Cardiovascular: Negative for chest pain and palpitations.  Gastrointestinal: Negative for constipation and diarrhea.  Genitourinary: Positive for dysuria, frequency and urgency. Negative for flank pain, hematuria, pelvic pain, vaginal bleeding, vaginal discharge and vaginal pain.     Physical Exam Triage Vital Signs ED Triage Vitals [03/04/20 1126]  Enc Vitals Group     BP 116/78     Pulse Rate 87     Resp 18     Temp 97.8 F (36.6 C)     Temp Source Oral     SpO2 96 %     Weight      Height      Head Circumference      Peak Flow      Pain Score 4     Pain Loc      Pain Edu?      Excl. in GC?    No data found.  Updated Vital Signs BP 116/78 (BP Location: Left Arm)   Pulse 87   Temp 97.8 F (36.6 C) (Oral)   Resp 18   SpO2 96%   Visual Acuity Right Eye Distance:   Left Eye Distance:   Bilateral Distance:    Right Eye Near:   Left Eye Near:    Bilateral Near:     Physical Exam Constitutional:      General: She is not in acute distress. HENT:     Head: Normocephalic and atraumatic.  Eyes:     General: No scleral icterus.    Pupils: Pupils are equal, round, and reactive to light.  Cardiovascular:     Rate and Rhythm: Normal rate.  Pulmonary:     Effort: Pulmonary effort is normal.  Abdominal:     General: Bowel sounds are normal.     Palpations: Abdomen is soft.     Tenderness: There is no abdominal tenderness. There is no right CVA tenderness, left CVA tenderness or guarding.  Skin:    Coloration: Skin is not jaundiced or pale.  Neurological:     Mental Status: She is alert and oriented to person, place, and time.      UC Treatments / Results  Labs (all labs ordered are listed, but only abnormal results are  displayed) Labs Reviewed  POCT URINALYSIS DIP (MANUAL ENTRY) - Abnormal; Notable for the following components:  Result Value   Color, UA orange (*)    Clarity, UA cloudy (*)    Glucose, UA =250 (*)    Bilirubin, UA small (*)    Ketones, POC UA small (15) (*)    Blood, UA trace-intact (*)    Protein Ur, POC >=300 (*)    Urobilinogen, UA >=8.0 (*)    Nitrite, UA Positive (*)    Leukocytes, UA Large (3+) (*)    All other components within normal limits  URINE CULTURE    EKG   Radiology No results found.  Procedures Procedures (including critical care time)  Medications Ordered in UC Medications - No data to display  Initial Impression / Assessment and Plan / UC Course  I have reviewed the triage vital signs and the nursing notes.  Pertinent labs & imaging results that were available during my care of the patient were reviewed by me and considered in my medical decision making (see chart for details).     Patient afebrile, nontoxic with reassuring exam.  Urine grossly abnormal as above, culture pending.  Discussed likely second to Azo.  Will treat with short course of Macrobid which patient has tolerated well in the past, and have patient follow-up with PCP for recurrent UTI.  Return precautions discussed, pt verbalized understanding and is agreeable to plan. Final Clinical Impressions(s) / UC Diagnoses   Final diagnoses:  Acute cystitis with hematuria     Discharge Instructions     Take antibiotic twice daily with food. Important to drink plenty of water throughout the day. May continue Azo as needed for burning sensation. Return for worsening urinary symptoms, blood in urine, abdominal or back pain, fever.    ED Prescriptions    Medication Sig Dispense Auth. Provider   nitrofurantoin, macrocrystal-monohydrate, (MACROBID) 100 MG capsule Take 1 capsule (100 mg total) by mouth 2 (two) times daily for 3 days. 6 capsule Hall-Potvin, [place], PA-C   fluconazole  (DIFLUCAN) 200 MG tablet Take 1 tablet (200 mg total) by mouth once for 1 dose. May repeat in 72 hours if needed 2 tablet Hall-Potvin, [place], PA-C     PDMP not reviewed this encounter.   Hall-Potvin, [place], [place] 03/04/20 1158

## 2020-03-04 NOTE — Discharge Instructions (Signed)
Take antibiotic twice daily with food. Important to drink plenty of water throughout the day. May continue Azo as needed for burning sensation. Return for worsening urinary symptoms, blood in urine, abdominal or back pain, fever. 

## 2020-03-04 NOTE — ED Triage Notes (Signed)
Pt here for dysuria x 3 days with hx of UTI in past that feels similar; pt sts taking azo without relief

## 2020-03-07 LAB — URINE CULTURE: Culture: >=100000 — AB

## 2020-04-04 NOTE — Progress Notes (Signed)
° ° °  SUBJECTIVE:   CHIEF COMPLAINT / HPI:   Symptoms started on December third.  Started off with a congestion in her nose, then started coughing.  She felt like shew was hit by a bus.  Up until yesterday she could barely walk. If she did walk she was weak and short of breath.  She took a covid teest was negative at a clinic. She was not tested for flu.   Yesterday was the first day she could move around..  She feels 75%-80% better.  Still has cough occasionally.  She was tested at NP clinic. Not vaccinated against covid or flu.  Daughter was sick and son had viral infection a few days before she got sick. No vomiting or diarrhea.  She couldn't smell or taste or anything initially but now it has returned.  She had nausea with food until yesterday.  She states she produces Clear mucous.    PERTINENT  PMH / PSH: DM2  OBJECTIVE:   Ht 5\' 6"  (1.676 m)    BMI 41.80 kg/m   Gen: alert. Oriented. No acute distress.  HEENT: perrla.  No oropharyngeal erythema.   CV: RRR.  Pulm: LCTAB. No wheezes or crackles.  Abd: soft, nontender. Normal bowel sounds.   ASSESSMENT/PLAN:   Flu-like symptoms Given her negative covid test, symptoms most consistent with flu.  Could possibly be due to other vial infection such as adenovirus.  Either way, she is recovering and is outside the window of being able to receive tamiflu.  Do not feel the need to be tested given she has had it for two weeks. Advised pt to continue hydrating and treating discomfort with tylenol.       , MD [facility]

## 2020-04-05 ENCOUNTER — Other Ambulatory Visit: Payer: Self-pay

## 2020-04-05 ENCOUNTER — Ambulatory Visit (INDEPENDENT_AMBULATORY_CARE_PROVIDER_SITE_OTHER): Payer: Medicaid Other | Admitting: Family Medicine

## 2020-04-05 VITALS — Ht 66.0 in

## 2020-04-05 DIAGNOSIS — R6889 Other general symptoms and signs: Secondary | ICD-10-CM

## 2020-04-05 NOTE — Patient Instructions (Signed)
It was nice to meet you today,  Based on your symptoms it appears you had some sort of viral infection, possibly the flu.  Regardless given that it has been 2 weeks and you are getting better there is no medications we need to give you at this time.  Continue to ease back into regular life.  You may still be getting fatigued when you walk, but this is due to deconditioning most likely.  If you have difficulty breathing, please let us know.  Eat and drink as tolerated.  Try to drink plenty of fluids to avoid dehydration.  Have a great day,  Frederic Jericho, MD

## 2020-04-06 DIAGNOSIS — R6889 Other general symptoms and signs: Secondary | ICD-10-CM | POA: Insufficient documentation

## 2020-04-06 NOTE — Assessment & Plan Note (Signed)
Given her negative covid test, symptoms most consistent with flu.  Could possibly be due to other vial infection such as adenovirus.  Either way, she is recovering and is outside the window of being able to receive tamiflu.  Do not feel the need to be tested given she has had it for two weeks. Advised pt to continue hydrating and treating discomfort with tylenol.

## 2020-06-18 IMAGING — US US MFM OB FOLLOW-UP
1 series · 14 of 28 positions shown · non-contrast
Comparison: none

[Series 1: us mfm ob follow-up · 14 of 29 slices shown]
[im 2/29]
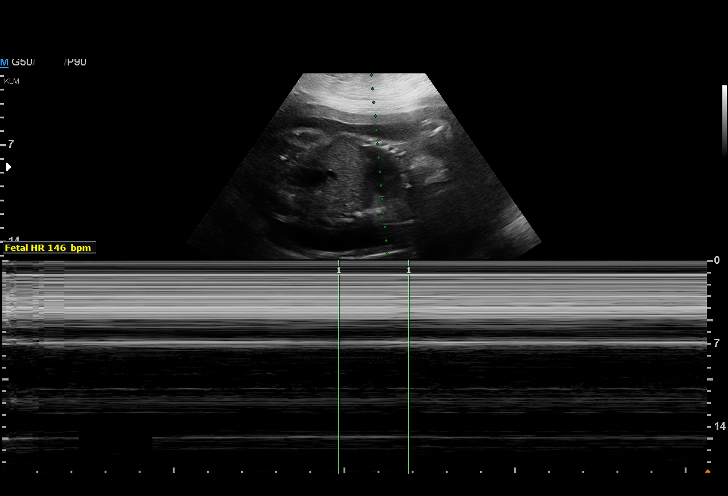
[im 4/29]
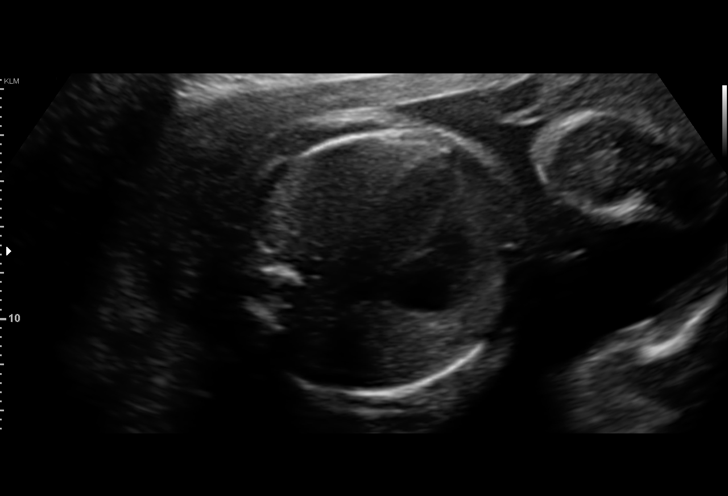
[im 6/29]
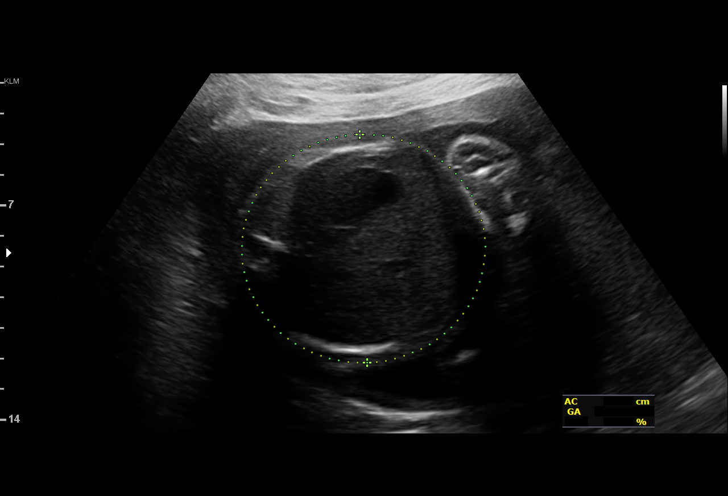
[im 8/29]
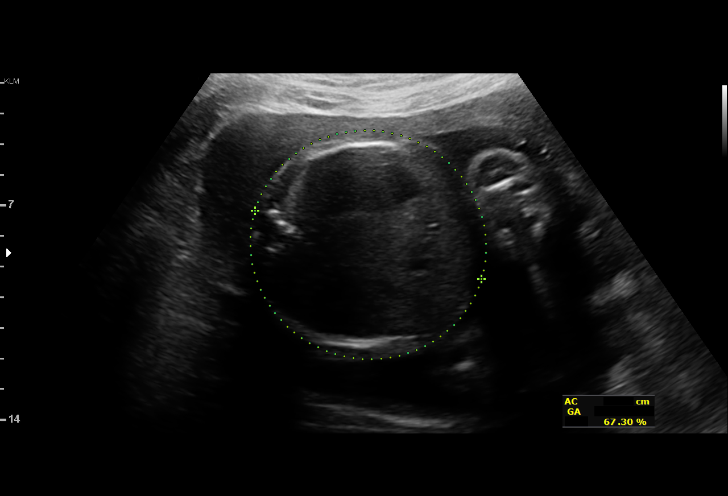
[im 10/29]
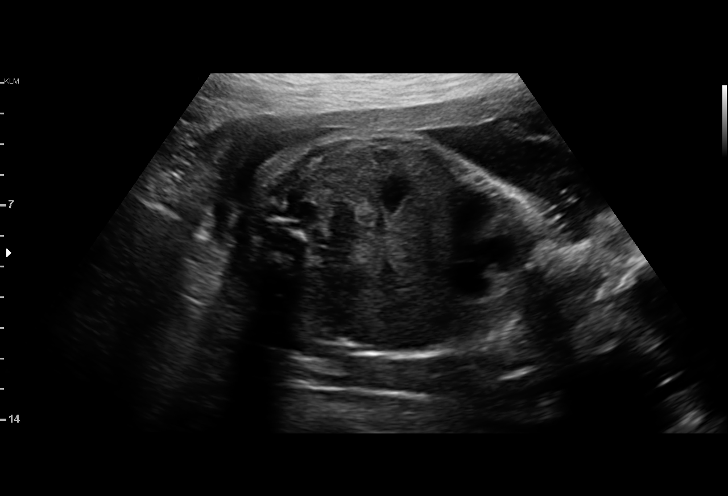
[im 12/29]
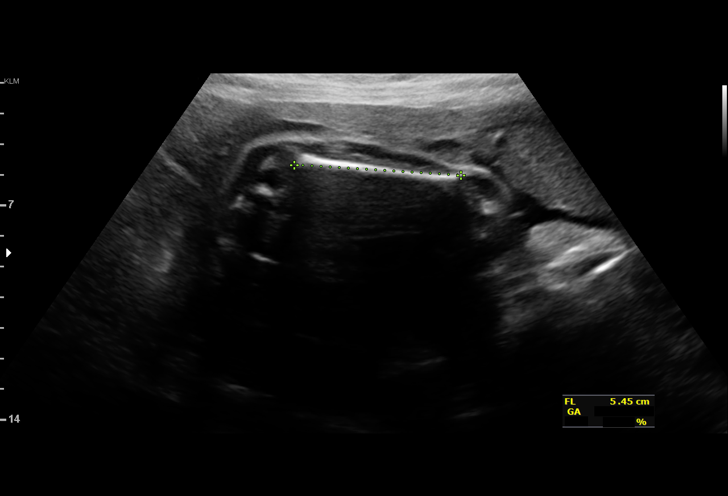
[im 14/29]
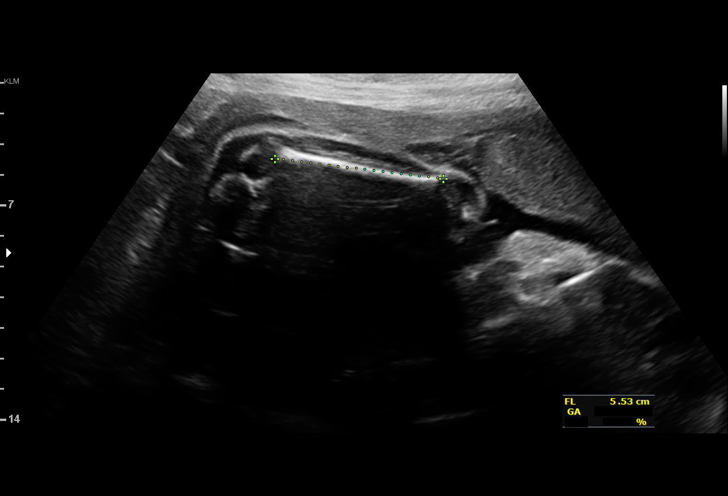
[im 16/29]
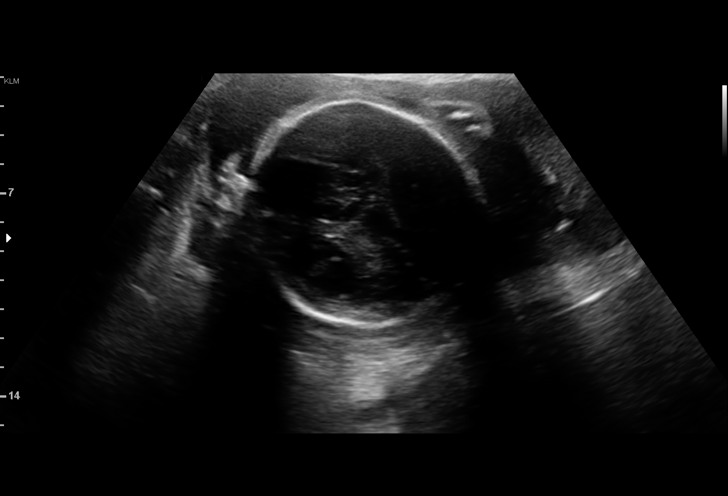
[im 18/29]
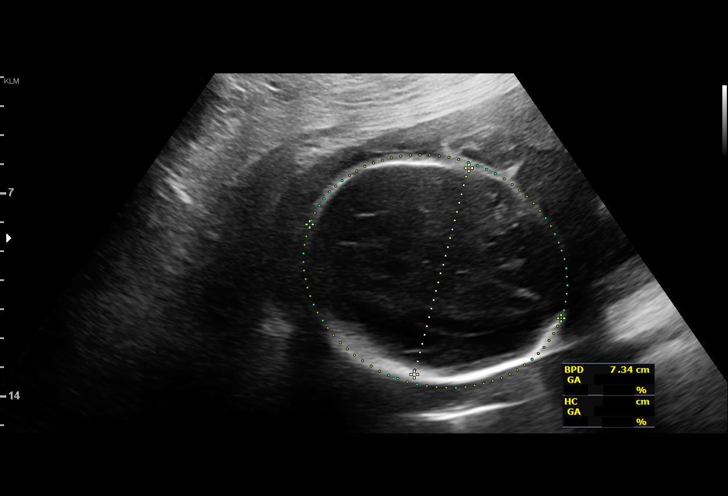
[im 20/29]
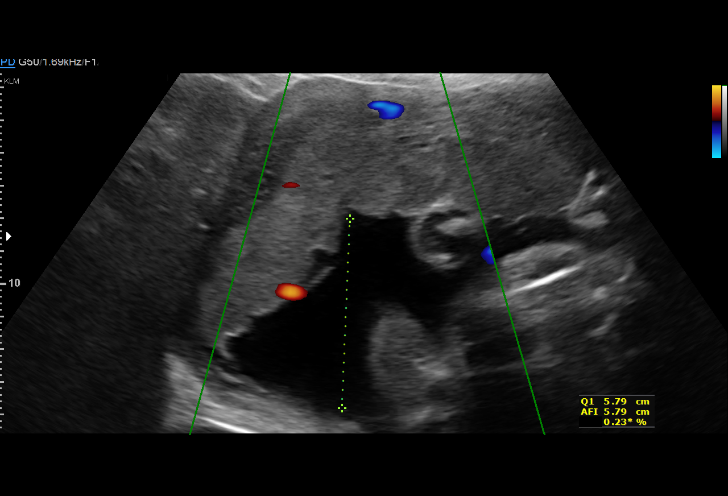
[im 22/29]
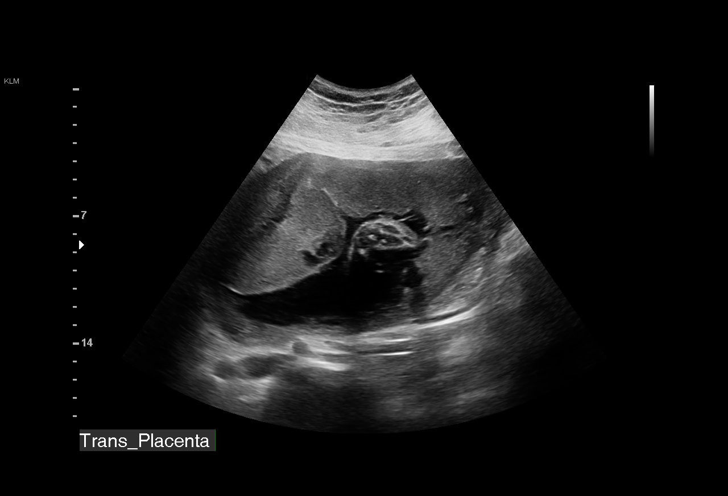
[im 24/29]
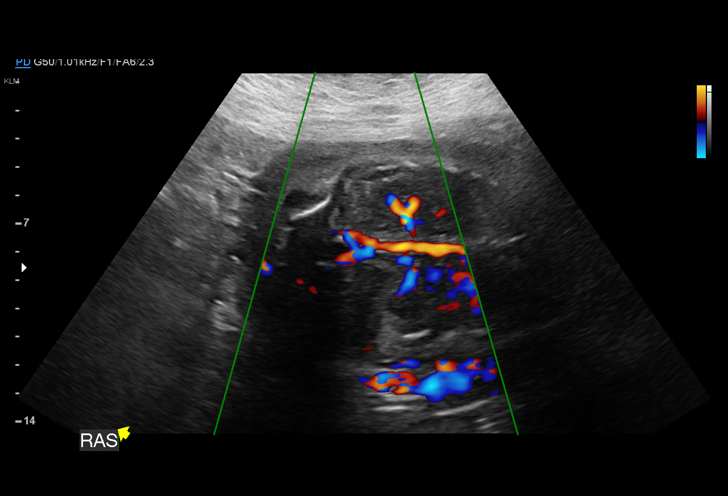
[im 26/29]
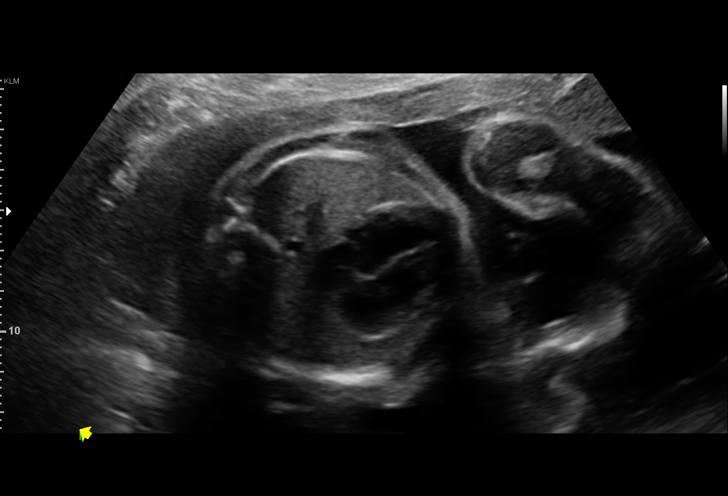
[im 29/29]
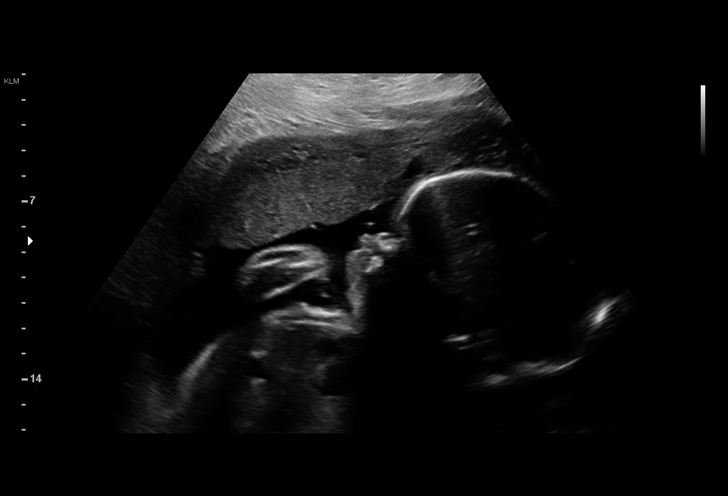

[14 of 28 positions shown; findings below may reference images not displayed]

OB/Gyn Clinic

 ----------------------------------------------------------------------

 ----------------------------------------------------------------------
Indications

  Pre-existing diabetes, type 2, in pregnancy,
  second trimester (metformin, insulin)
  27 weeks gestation of pregnancy
  Previous cesarean delivery, antepartum
 ----------------------------------------------------------------------
Vital Signs

                                                Height:        5'6"
Fetal Evaluation

 Num Of Fetuses:          1
 Fetal Heart Rate(bpm):   146
 Cardiac Activity:        Observed
 Presentation:            Cephalic
 Placenta:                Left lateral
 P. Cord Insertion:       Visualized

 Amniotic Fluid
 AFI FV:      Within normal limits

                             Largest Pocket(cm)
                             7
Biometry

 BPD:        73  mm     G. Age:  29w 2d         93  %    CI:        73.05   %    70 - 86
                                                         FL/HC:       20.8  %    18.6 -
 HC:      271.5  mm     G. Age:  29w 5d         89  %    HC/AC:       1.10       1.05 -
 AC:      246.3  mm     G. Age:  28w 6d         85  %    FL/BPD:      77.5  %    71 - 87
 FL:       56.6  mm     G. Age:  29w 5d         93  %    FL/AC:       23.0  %    20 - 24

 Est. FW:    6337   gm          3 lb     82  %
OB History

 Gravidity:    2         Term:   1        Prem:   0        SAB:   0
 TOP:          0       Ectopic:  0        Living: 1
Gestational Age

 LMP:           27w 2d        Date:  11/04/17                 EDD:   08/11/18
 U/S Today:     29w 3d                                        EDD:   07/27/18
 Best:          27w 2d     Det. By:  LMP  (11/04/17)          EDD:   08/11/18
Anatomy

 Cranium:               Appears normal         Aortic Arch:            Previously seen
 Cavum:                 Previously seen        Ductal Arch:            Appears normal
 Ventricles:            Previously seen        Diaphragm:              Appears normal
 Choroid Plexus:        Previously seen        Stomach:                Appears normal, left
                                                                       sided
 Cerebellum:            Appears normal         Abdomen:                Appears normal
 Posterior Fossa:       Previously seen        Abdominal Wall:         Previously seen
 Nuchal Fold:           Previously seen        Cord Vessels:           Previously seen
 Face:                  Orbits and profile     Kidneys:                Appear normal
                        previously seen
 Lips:                  Previously seen        Bladder:                Appears normal
 Thoracic:              Appears normal         Spine:                  Previously seen
 Heart:                 Appears normal         Upper Extremities:      Previously seen
                        (4CH, axis, and situs
 RVOT:                  Previously seen        Lower Extremities:      Previously seen
 LVOT:                  Previously seen

 Other:  Parents do not wish to know sex of fetus. Nasal bone prev visualized.
         Technically difficult due to maternal habitus and fetal position.
Cervix Uterus Adnexa

 Cervix
 Not visualized (advanced GA >19wks)
Impression

 Normal interval growth.
Recommendations

 Follow up growth and BPP at 32 weeks.

## 2020-07-06 ENCOUNTER — Other Ambulatory Visit: Payer: Self-pay | Admitting: Family Medicine

## 2020-07-06 DIAGNOSIS — E669 Obesity, unspecified: Secondary | ICD-10-CM

## 2020-07-06 DIAGNOSIS — E1169 Type 2 diabetes mellitus with other specified complication: Secondary | ICD-10-CM

## 2020-07-30 IMAGING — US US MFM FETAL BPP W/ NON-STRESS
1 series · 13 of 28 positions shown · non-contrast
Comparison: none

[Series 1: us mfm fetal bpp w/ non-stress · 29 acquisitions, 13 frames shown]
[im 2/29]
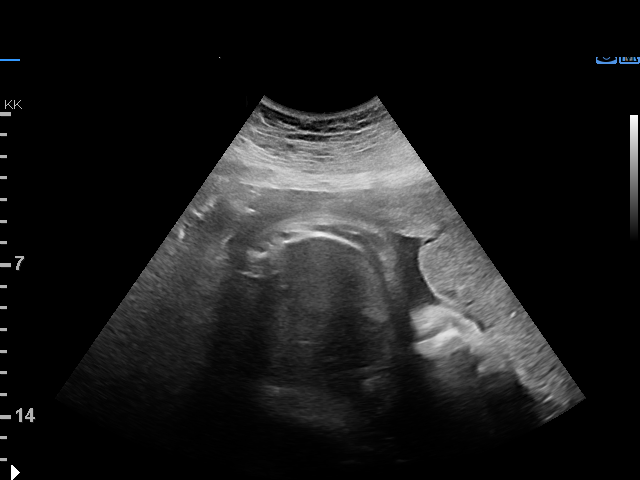
[im 4/29]
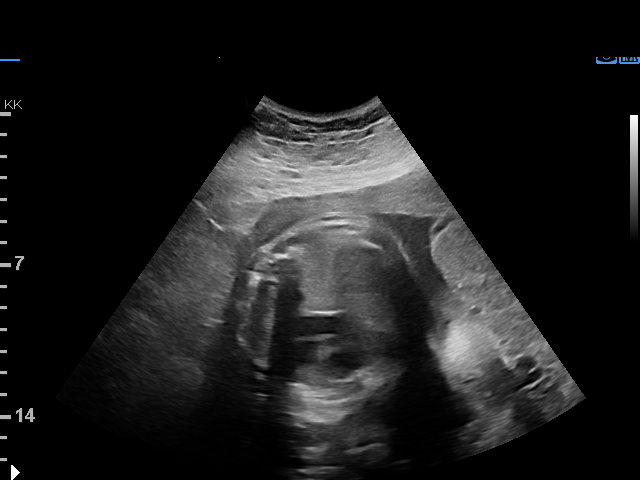
[im 6/29]
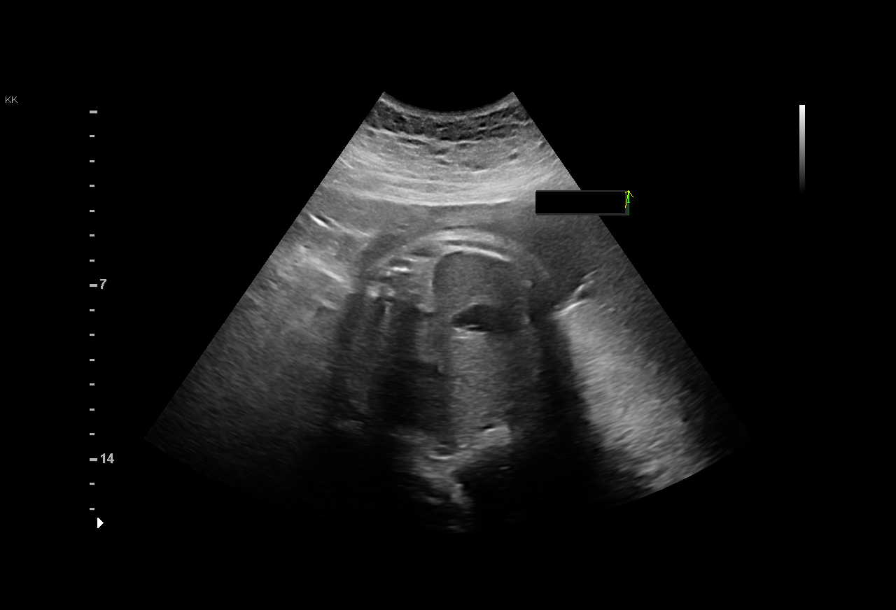
[im 8/29]
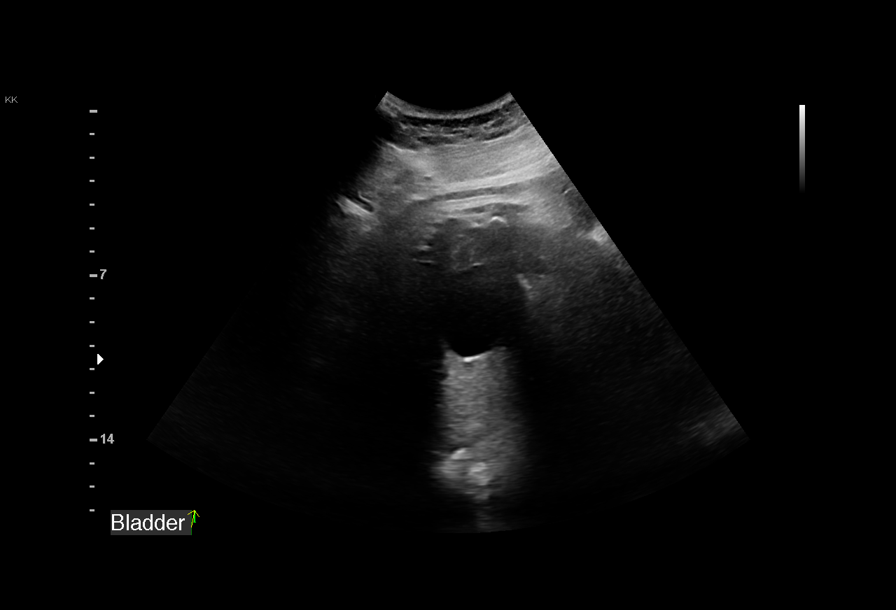
[im 10/29]
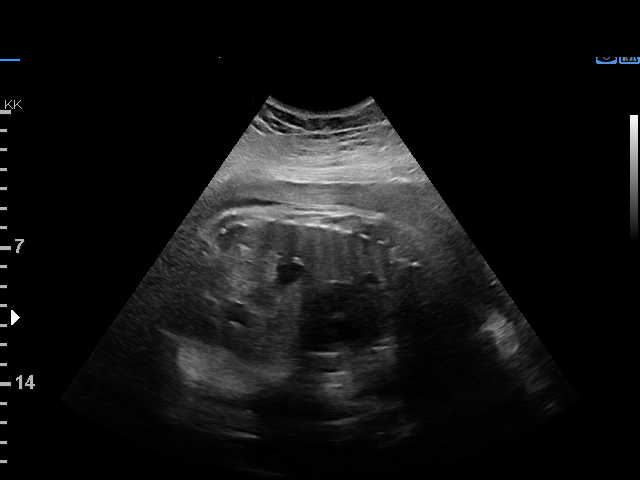
[im 12/29]
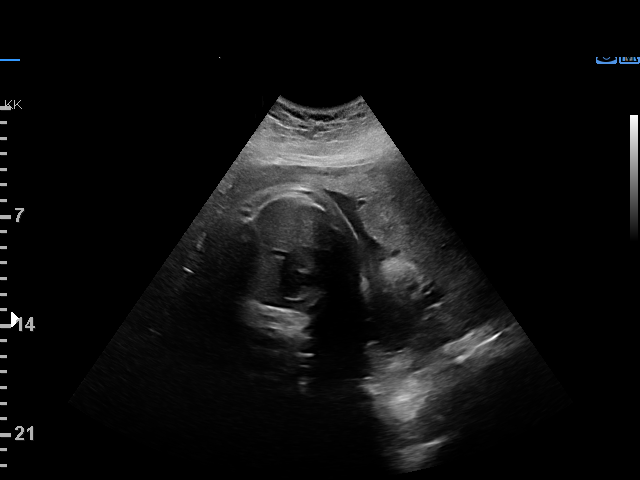
[im 15/29]
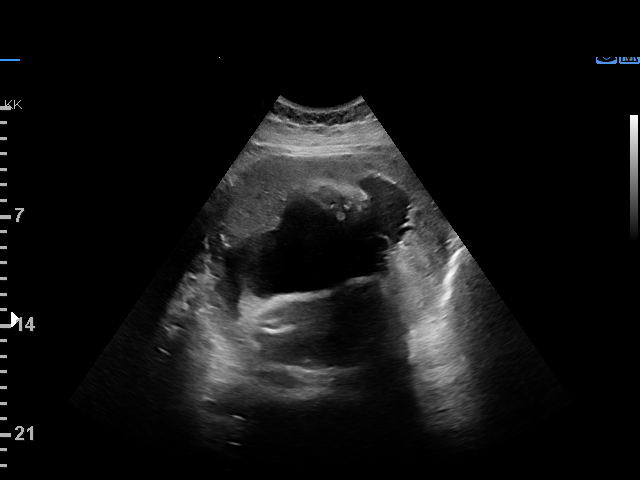
[im 17/29]
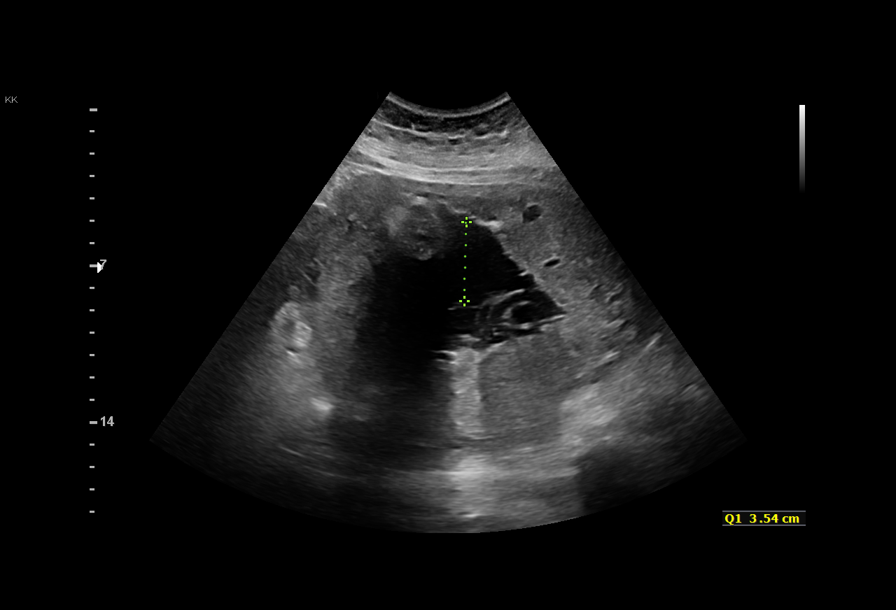
[im 19/29]
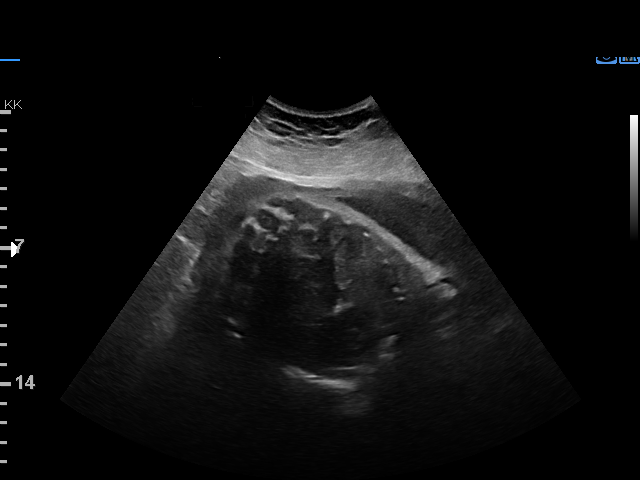
[im 21/29]
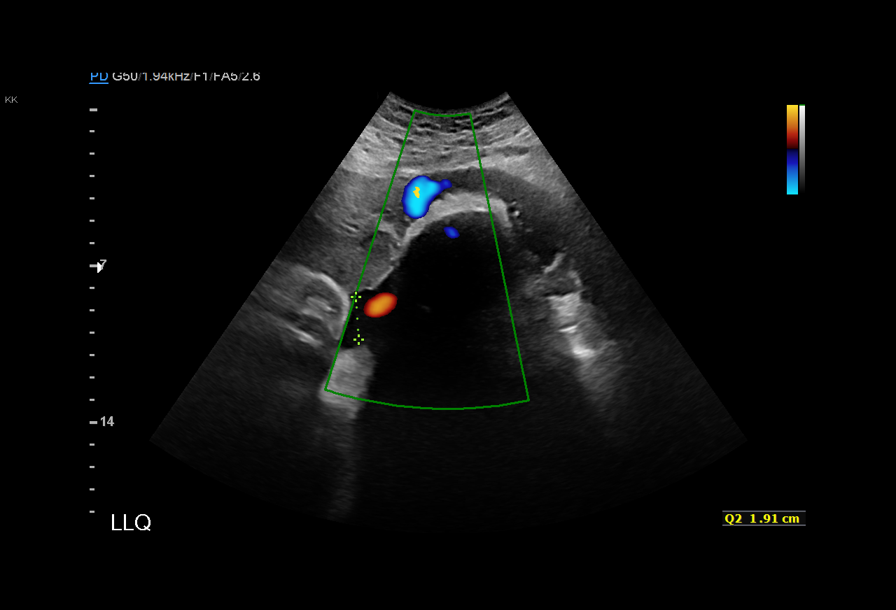
[im 23/29]
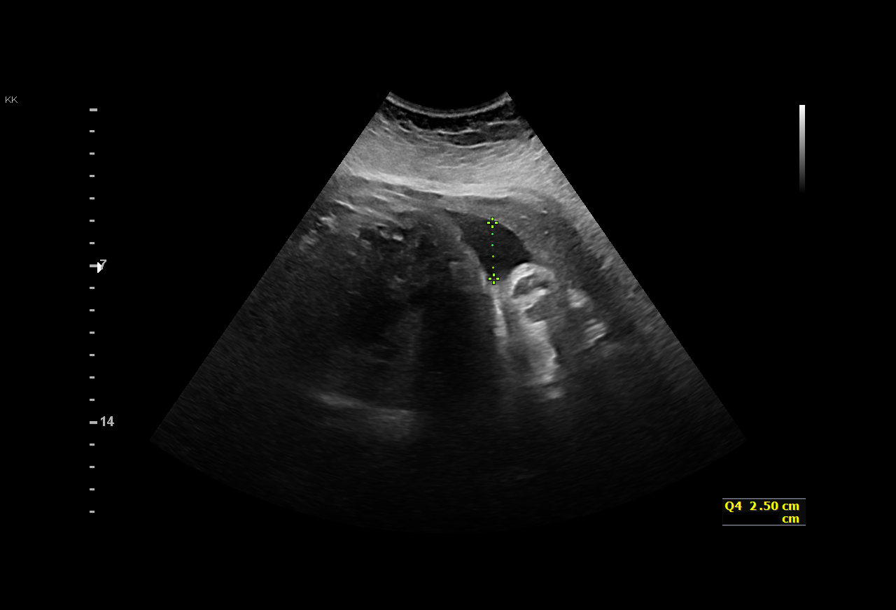
[im 25/29]
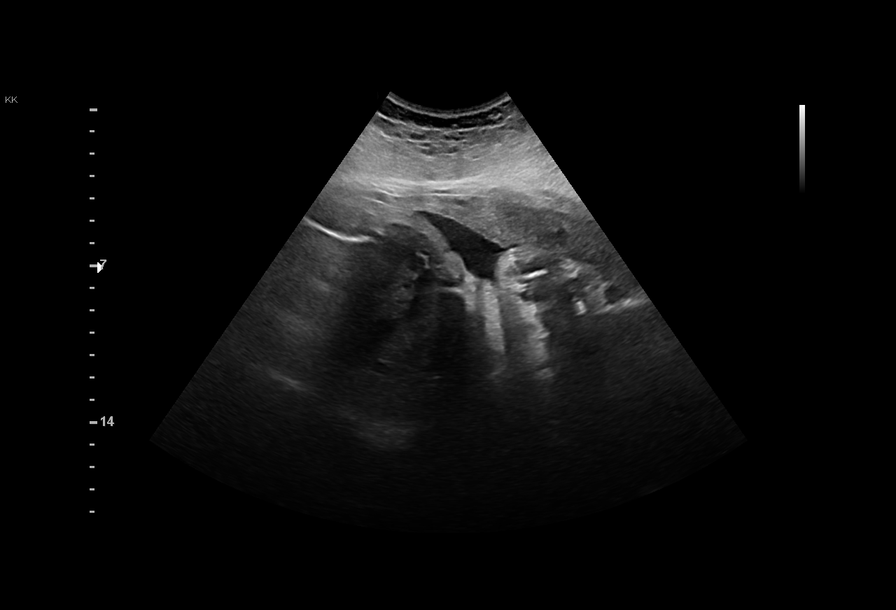
[im 27/29]
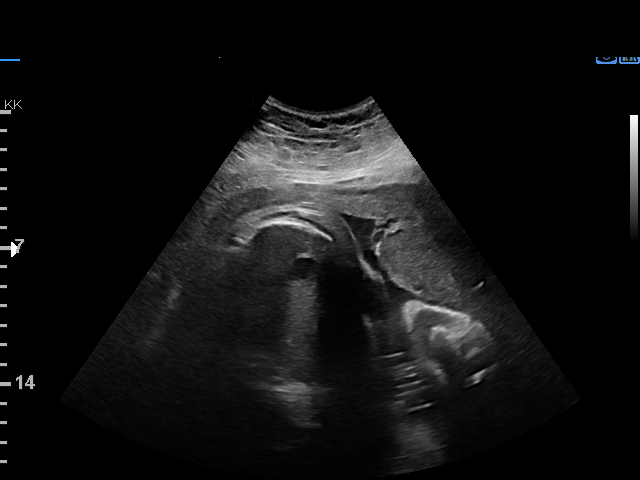

[13 of 28 positions shown; findings below may reference images not displayed]

OB/Gyn Clinic

     W/NONSTRESS
 ----------------------------------------------------------------------

 ----------------------------------------------------------------------
Indications

  Pre-existing diabetes, type 2, in pregnancy,
  third trimester (metformin, insulin)
  Previous cesarean delivery, antepartum
  Obesity complicating pregnancy, third
  trimester
  33 weeks gestation of pregnancy
 ----------------------------------------------------------------------
Vital Signs

 BMI:
Fetal Evaluation

 Num Of Fetuses:         1
 Fetal Heart Rate(bpm):  167
 Cardiac Activity:       Observed
 Presentation:           Cephalic

 Amniotic Fluid
 AFI FV:      Within normal limits

 AFI Sum(cm)     %Tile       Largest Pocket(cm)
 9.98            18

 RUQ(cm)       RLQ(cm)       LUQ(cm)        LLQ(cm)

Biophysical Evaluation

 Amniotic F.V:   Pocket => 2 cm two         F. Tone:        Observed
                 planes
 F. Movement:    Observed                   N.S.T:          Nonreactive
 F. Breathing:   Observed                   Score:          [DATE]
OB History

 Gravidity:    2         Term:   1        Prem:   0        SAB:   0
 TOP:          0       Ectopic:  0        Living: 1
Gestational Age

 LMP:           33w 2d        Date:  11/04/17                 EDD:   08/11/18
 Best:          33w 2d     Det. By:  LMP  (11/04/17)          EDD:   08/11/18
Impression

 Amniotic fluid is normal and good fetal activity is seen.
 Antenatal testing is reassuring. NST is nonreactive. BPP [DATE].
 Pregestational diabetes and the patient reports her glucose
 levels are within normal range.
                 [place], [place]

## 2020-08-02 ENCOUNTER — Ambulatory Visit: Payer: Medicaid Other | Admitting: Family Medicine

## 2020-08-02 NOTE — Progress Notes (Deleted)
    SUBJECTIVE:   CHIEF COMPLAINT / HPI:   DM2: patient reports that she has not been checking BG. She is compliant with medications: metformin 1000 mg BID.  PSV23, foot exam. Patient has *** seen ophthalmology.  PERTINENT  PMH / PSH: ***  OBJECTIVE:   There were no vitals taken for this visit.  ***  ASSESSMENT/PLAN:   No problem-specific Assessment & Plan notes found for this encounter.     Shirlean Mylar, MD [facility]   {    This will disappear when note is signed, click to select method of visit    :1}

## 2020-08-02 NOTE — Patient Instructions (Incomplete)
It was a pleasure to see you today!  1.    Be Well,  Dr. Mahoney  

## 2020-08-23 ENCOUNTER — Telehealth: Payer: Self-pay | Admitting: Physician Assistant

## 2020-08-23 NOTE — Telephone Encounter (Signed)
Received a new hem referral from [facility] for IDA. Christina Morton has been cld and scheduled to see [name] on 5/9 at 2pm. Pt aware to arrive 20 minutes early.

## 2020-08-27 ENCOUNTER — Other Ambulatory Visit: Payer: Self-pay

## 2020-08-27 ENCOUNTER — Encounter: Payer: Self-pay | Admitting: Physician Assistant

## 2020-08-27 ENCOUNTER — Inpatient Hospital Stay: Payer: Medicaid Other | Attending: Physician Assistant | Admitting: Physician Assistant

## 2020-08-27 ENCOUNTER — Inpatient Hospital Stay: Payer: Medicaid Other

## 2020-08-27 VITALS — BP 104/74 | HR 85 | Temp 97.9°F | Resp 18 | Ht 66.0 in | Wt 252.5 lb

## 2020-08-27 DIAGNOSIS — Z87891 Personal history of nicotine dependence: Secondary | ICD-10-CM | POA: Diagnosis not present

## 2020-08-27 DIAGNOSIS — E119 Type 2 diabetes mellitus without complications: Secondary | ICD-10-CM | POA: Diagnosis not present

## 2020-08-27 DIAGNOSIS — K5900 Constipation, unspecified: Secondary | ICD-10-CM | POA: Diagnosis not present

## 2020-08-27 DIAGNOSIS — R5383 Other fatigue: Secondary | ICD-10-CM | POA: Insufficient documentation

## 2020-08-27 DIAGNOSIS — D508 Other iron deficiency anemias: Secondary | ICD-10-CM

## 2020-08-27 DIAGNOSIS — Z7984 Long term (current) use of oral hypoglycemic drugs: Secondary | ICD-10-CM | POA: Diagnosis not present

## 2020-08-27 DIAGNOSIS — Z803 Family history of malignant neoplasm of breast: Secondary | ICD-10-CM | POA: Insufficient documentation

## 2020-08-27 DIAGNOSIS — Z79899 Other long term (current) drug therapy: Secondary | ICD-10-CM | POA: Insufficient documentation

## 2020-08-27 DIAGNOSIS — D509 Iron deficiency anemia, unspecified: Secondary | ICD-10-CM | POA: Diagnosis not present

## 2020-08-27 LAB — CBC WITH DIFFERENTIAL (CANCER CENTER ONLY)
Abs Immature Granulocytes: 0.01 10*3/uL (ref 0.00–0.07)
Basophils Absolute: 0 10*3/uL (ref 0.0–0.1)
Basophils Relative: 0 %
Eosinophils Absolute: 0.1 10*3/uL (ref 0.0–0.5)
Eosinophils Relative: 1 %
HCT: 37.7 % (ref 36.0–46.0)
Hemoglobin: 11.1 g/dL — ABNORMAL LOW (ref 12.0–15.0)
Immature Granulocytes: 0 %
Lymphocytes Relative: 38 %
Lymphs Abs: 2.4 10*3/uL (ref 0.7–4.0)
MCH: 20.1 pg — ABNORMAL LOW (ref 26.0–34.0)
MCHC: 29.4 g/dL — ABNORMAL LOW (ref 30.0–36.0)
MCV: 68.4 fL — ABNORMAL LOW (ref 80.0–100.0)
Monocytes Absolute: 0.6 10*3/uL (ref 0.1–1.0)
Monocytes Relative: 9 %
Neutro Abs: 3.2 10*3/uL (ref 1.7–7.7)
Neutrophils Relative %: 52 %
Platelet Count: 402 10*3/uL — ABNORMAL HIGH (ref 150–400)
RBC: 5.51 MIL/uL — ABNORMAL HIGH (ref 3.87–5.11)
RDW: 17.2 % — ABNORMAL HIGH (ref 11.5–15.5)
WBC Count: 6.2 10*3/uL (ref 4.0–10.5)
nRBC: 0 % (ref 0.0–0.2)

## 2020-08-27 LAB — RETIC PANEL
Immature Retic Fract: 13.3 % (ref 2.3–15.9)
RBC.: 5.5 MIL/uL — ABNORMAL HIGH (ref 3.87–5.11)
Retic Count, Absolute: 89.7 10*3/uL (ref 19.0–186.0)
Retic Ct Pct: 1.6 % (ref 0.4–3.1)
Reticulocyte Hemoglobin: 24.7 pg — ABNORMAL LOW (ref >27.9)

## 2020-08-27 MED ORDER — FERROUS SULFATE 325 (65 FE) MG PO TBEC
325.0000 mg | DELAYED_RELEASE_TABLET | Freq: Every day | ORAL | 3 refills | Status: DC
Start: 2020-08-27 — End: 2021-02-14

## 2020-08-27 NOTE — Progress Notes (Signed)
[facility] [phone]   [phone]  INITIAL CONSULT NOTE  Patient Care Team: Shirlean Mylar, MD as PCP - General (Family Medicine) Drema Dallas, DO as Consulting Physician (Neurology)  Hematological/Oncological History 1) 08/10/2020: Labs from [facility] PA-C from [facility] -WBC 6.5, Hgb 10.8 (L), MCV 64.5 (L), Plt 372, Ferritin 26.6, Iron 32 (L), Transferrin 237, Iron Saturation 10% (L), TIBC 332, negative hemoccult test  2) 08/27/2020: Establish care with Georga Kaufmann PA-C  CHIEF COMPLAINTS/PURPOSE OF CONSULTATION:  "Iron deficiency anemia "  HISTORY OF PRESENTING ILLNESS:  Christina Morton 35 y.o. female with medical history significant for Type 2 DM, depression and chronic iron deficiency anemia. Patient is unaccompanied for this visit.   On exam today, Ms. Merkey reports that she is chronically fatigue but still continues to complete her daily activities on her own. She notes to have anemia throughout her life, requiring oral iron supplementation. She states that prior oral iron supplementation caused diarrhea so she discontinued the medication. She denies any appetite changes or dietary restrictions. Patient denies any nausea, vomiting or abdominal pain. She was recently constipated after starting back on oral iron supplements. Her constipation has improved after taking miralax. Patient denies easy bruising or signs of bleeding except for occasional nose bleeds. She does not have a monthly menstrual cycle ever since placement of IUD. Prior to IUD placement, she denies heavy menstruation with each cycle lasting 3 days. During her heavy days, she would only require changing a pad/tampon 3-4 times a day.Patient denies any fevers, chills, night sweats, shortness of breath, chest pain or cough. She has no other complaints. Rest of the 10 point ROS is below.   MEDICAL HISTORY:  Past Medical History:  Diagnosis Date  . Anemia   . Depression   .  Glaucoma   . History of cesarean section, classical 01/21/2018   Low transverse with 5 cm extension into muscular layer--was told at [facility] risk of rupture--will treat as prior classical  . Mental disorder   . Type 2 diabetes mellitus without complications (HCC)   . 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 CESAREAN SECTION    . CESAREAN SECTION WITH BILATERAL TUBAL LIGATION Bilateral 07/21/2018   Procedure: CESAREAN SECTION WITH BILATERAL TUBAL LIGATION;  Surgeon: Kathrynn Running, MD;  Location: [facility] LD ORS;  Service: Obstetrics;  Laterality: Bilateral;  . CYSTECTOMY      SOCIAL HISTORY: Social History   Socioeconomic History  . Marital status: Single    Spouse name: Not on file  . Number of children: 2  . Years of education: Not on file  . Highest education level: Some college, no degree  Occupational History  . Occupation: not working  Tobacco Use  . Smoking status: Former Smoker    Packs/day: 1.00    Years: 1.00    Pack years: 1.00    Types: Cigarettes    Quit date: 11/22/2017    Years since quitting: 2.7  . Smokeless tobacco: Never Used  Vaping Use  . Vaping Use: Never used  Substance and Sexual Activity  . Alcohol use: Yes    Comment: once a monthly  . Drug use: Yes    Types: Marijuana    Comment: occasionally, once a month  . Sexual activity: Yes    Birth control/protection: I.U.D.  Other Topics Concern  . Not on file  Social History Narrative   Right handed   Two story    No caffeine, maybe coffee once a week   Social Determinants of Health   Financial Resource Strain: Not on file  Food Insecurity: Not on file  Transportation Needs: Not on file  Physical Activity: Not on file  Stress: Not on file  Social Connections: Not on file  Intimate Partner Violence: Not on file    FAMILY HISTORY: Family History  Problem Relation Age of Onset  .  Hypertrophic cardiomyopathy Mother   . Bradycardia Mother   . Healthy Father   . Breast cancer Maternal Great-grandmother     ALLERGIES:  is allergic to amoxicillin and penicillins.  MEDICATIONS:  Current Outpatient Medications  Medication Sig Dispense Refill  . ferrous sulfate 325 (65 FE) MG EC tablet Take 1 tablet (325 mg total) by mouth daily with breakfast. 30 tablet 3  . eszopiclone (LUNESTA) 1 MG TABS tablet Take 1 tablet (1 mg total) by mouth at bedtime as needed for sleep. Take immediately before bedtime 30 tablet 1  . famotidine (PEPCID) 20 MG tablet Take 1 tablet (20 mg total) by mouth at bedtime. 30 tablet 1  . metFORMIN (GLUMETZA) 1000 MG (MOD) 24 hr tablet TAKE ONE TABLET BY MOUTH IN THE MORNING AND AT BEDTIME. TAKE TABLET WITH HIGH PROTEIN MEAL. 180 tablet 0  . rizatriptan (MAXALT) 10 MG tablet Take 1 tablet earliest onset of migraine.  May repeat in 2 hours if needed.  Maximum 2 tablets in 24 hours 10 tablet 3  . topiramate (TOPAMAX) 50 MG tablet Take 25mg  at hs for one week and then 50mg  at thereforth at hs     No current facility-administered medications for this visit.    REVIEW OF SYSTEMS:   Constitutional: ( - ) fevers, ( - )  chills , ( - ) night sweats Eyes: ( - ) blurriness of vision, ( - ) double vision, ( - ) watery eyes Ears, nose, mouth, throat, and face: ( - ) mucositis, ( - ) sore throat Respiratory: ( - ) cough, ( - ) dyspnea, ( - ) wheezes Cardiovascular: ( - ) palpitation, ( - ) chest discomfort, ( - ) lower extremity swelling Gastrointestinal:  ( - ) nausea, ( - ) heartburn, ( - ) change in bowel habits Skin: ( - ) abnormal skin rashes Lymphatics: ( - ) new lymphadenopathy, ( - ) easy bruising Neurological: ( - ) numbness, ( - ) tingling, ( - ) new weaknesses Behavioral/Psych: ( - ) mood change, ( - ) new changes  All other systems were reviewed with the patient and are negative.  PHYSICAL EXAMINATION: ECOG PERFORMANCE STATUS: 1 - Symptomatic but  completely ambulatory  Vitals:   08/27/20 1351  BP: 104/74  Pulse: 85  Resp: 18  Temp: 97.9 F (36.6 C)  SpO2: 100%   Filed Weights   08/27/20 1351  Weight: 252 lb 8 oz (114.5 kg)    GENERAL: well appearing African American female in NAD  SKIN: skin color, texture, turgor are normal, no rashes or significant lesions EYES: conjunctiva are pink and non-injected, sclera clear OROPHARYNX: no exudate, no erythema; lips, buccal mucosa, and tongue normal  NECK: supple, non-tender LYMPH:  no palpable lymphadenopathy in the cervical, axillary or supraclavicular lymph nodes.  LUNGS: clear to auscultation and percussion with normal breathing effort HEART: regular rate & rhythm and no murmurs and no lower extremity edema ABDOMEN: soft, non-tender, non-distended, normal bowel sounds Musculoskeletal: no  cyanosis of digits and no clubbing  PSYCH: alert & oriented x 3, fluent speech NEURO: no focal motor/sensory deficits  LABORATORY DATA:  I have reviewed the data as listed CBC Latest Ref Rng & Units 08/27/2020 07/22/2018 07/21/2018  WBC 4.0 - 10.5 K/uL 6.2 8.7 11.1(H)  Hemoglobin 12.0 - 15.0 g/dL 11.1(L) 8.1(L) 8.7(L)  Hematocrit 36.0 - 46.0 % 37.7 26.9(L) 29.2(L)  Platelets 150 - 400 K/uL 402(H) 269 305    CMP Latest Ref Rng & Units 08/04/2019 07/23/2018 07/22/2018  Glucose 65 - 99 mg/dL 768(T) 157(W) 91  BUN 6 - 20 mg/dL 9 - -  Creatinine 6.20 - 1.00 mg/dL 3.55 - -  Sodium 974 - 144 mmol/L 139 - -  Potassium 3.5 - 5.2 mmol/L 4.2 - -  Chloride 96 - 106 mmol/L 107(H) - -  CO2 20 - 29 mmol/L 18(L) - -  Calcium 8.7 - 10.2 mg/dL 9.6 - -    ASSESSMENT & PLAN Christina Morton is a 35 y.o. female presenting to the clinic for evaluation for iron deficiency anemia. It is uncertain etiology since patient does not have a menstrual cycle with IUD in place, denies signs of bleeding with negative hemoccult test, no abdominal surgeries including gastric bypass or dietary restrictions. Patient is currently on  oral iron (Fusion) supplementation with constipation that is managed with miralax. Recommend to proceed with labs today to check CBC, iron and TIBC, ferritin, retic panel and hemoglobin electrophoresis.  # Iron deficiency anemia, etiology unknown: --Labs today to check CBC, iron and TIBC, ferritin, retic panel. Check for hemoglobinopathies with hemoglobin electrophoresis due to long history of anemia with microcytosis.  --Currently on oral iron (Fusion) once daily. Will send prescription for ferrous sulfate 325 mg daily. Advised to take with a glass of orange juice. --Provided a list of iron rich foods to incorporate in diet.  --Need to follow up with [facility] as they are doing workup for celiac disease. Consider EGD/colonoscopy if underlying cause cannot be determined with persistent anemia.  --If hemoglobin levels don't improve with oral iron, need to consider IV iron.  --RTC in 3 months with repeat labs.   Orders Placed This Encounter  Procedures  . Retic Panel    Standing Status:   Future    Number of Occurrences:   1    Standing Expiration Date:   08/27/2021  . CBC with Differential (Cancer Center Only)    Standing Status:   Future    Number of Occurrences:   1    Standing Expiration Date:   08/27/2021  . Hgb Fractionation Cascade    Standing Status:   Future    Number of Occurrences:   1    Standing Expiration Date:   08/27/2021  . Ferritin    Standing Status:   Future    Number of Occurrences:   1    Standing Expiration Date:   08/27/2021  . Iron and TIBC    Standing Status:   Future    Number of Occurrences:   1    Standing Expiration Date:   08/27/2021    All questions were answered. The patient knows to call the clinic with any problems, questions or concerns.  A total of more than 60 minutes were spent on this encounter and over half of that time was spent on counseling and coordination of care as outlined above.    Georga Kaufmann, PA-C Department of Hematology/Oncology [facility] at [facility] Phone:  [phone]  Patient was seen with Dr. Leonides Schanz.   I have read the above note and personally examined the patient. I agree with the assessment and plan as noted above.  Briefly Christina Morton is a 34 year old female with medical history significant for iron deficiency anemia who presents for evaluation management.  Patient is currently followed by [facility].  At this time recommend increase intake of iron rich foods as well as ferrous sulfate 325 mg p.o. daily with a source of vitamin C.  We will plan to see the patient back in 3 months in order to further evaluate.   Ulysees Barns, MD Department of Hematology/Oncology [facility] at [facility] Phone: [phone] Pager: [phone] Email: [email or web address]

## 2020-08-28 LAB — IRON AND TIBC
Iron: 42 ug/dL (ref 41–142)
Saturation Ratios: 13 % — ABNORMAL LOW (ref 21–57)
TIBC: 311 ug/dL (ref 236–444)
UIBC: 270 ug/dL (ref 120–384)

## 2020-08-28 LAB — FERRITIN: Ferritin: 37 ng/mL (ref 11–307)

## 2020-08-29 ENCOUNTER — Telehealth: Payer: Self-pay | Admitting: Physician Assistant

## 2020-08-29 LAB — HGB FRACTIONATION CASCADE
Hgb A2: 2.4 % (ref 1.8–3.2)
Hgb A: 97.6 % (ref 96.4–98.8)
Hgb F: 0 % (ref 0.0–2.0)
Hgb S: 0 %

## 2020-08-29 NOTE — Telephone Encounter (Signed)
Scheduled per los. Called and left msg. Mailed printout  °

## 2020-08-30 ENCOUNTER — Telehealth: Payer: Self-pay | Admitting: Physician Assistant

## 2020-08-30 NOTE — Telephone Encounter (Signed)
I called [name] to review the lab results from 08/27/2020. Labs indicate iron deficiency anemia with iron saturation at 13%. There was no evidence of hemoglobinopathies based on hemoglobin electrophoresis. Recommend to proceed with ferrous sulfate 325 mg once daily. Patient will return to the clinic in 3 months with repeat labs.

## 2020-11-26 ENCOUNTER — Other Ambulatory Visit: Payer: Self-pay | Admitting: Physician Assistant

## 2020-11-26 DIAGNOSIS — D508 Other iron deficiency anemias: Secondary | ICD-10-CM

## 2020-11-27 ENCOUNTER — Inpatient Hospital Stay: Payer: Medicaid Other

## 2020-11-27 ENCOUNTER — Telehealth: Payer: Self-pay | Admitting: Physician Assistant

## 2020-11-27 ENCOUNTER — Inpatient Hospital Stay: Payer: Medicaid Other | Admitting: Physician Assistant

## 2020-11-27 NOTE — Telephone Encounter (Signed)
R/s appt per 8/9 sch msg. Pt aware

## 2020-11-29 ENCOUNTER — Other Ambulatory Visit: Payer: Self-pay

## 2020-11-29 ENCOUNTER — Encounter (INDEPENDENT_AMBULATORY_CARE_PROVIDER_SITE_OTHER): Payer: Medicaid Other | Admitting: Ophthalmology

## 2020-11-29 DIAGNOSIS — H43813 Vitreous degeneration, bilateral: Secondary | ICD-10-CM | POA: Diagnosis not present

## 2020-11-29 DIAGNOSIS — H33012 Retinal detachment with single break, left eye: Secondary | ICD-10-CM | POA: Diagnosis not present

## 2020-11-29 DIAGNOSIS — H33303 Unspecified retinal break, bilateral: Secondary | ICD-10-CM | POA: Diagnosis not present

## 2020-12-03 ENCOUNTER — Other Ambulatory Visit: Payer: Self-pay | Admitting: Physician Assistant

## 2020-12-04 ENCOUNTER — Inpatient Hospital Stay: Payer: Medicaid Other | Attending: Physician Assistant

## 2020-12-04 ENCOUNTER — Other Ambulatory Visit: Payer: Self-pay

## 2020-12-04 ENCOUNTER — Inpatient Hospital Stay (HOSPITAL_BASED_OUTPATIENT_CLINIC_OR_DEPARTMENT_OTHER): Payer: Medicaid Other | Admitting: Physician Assistant

## 2020-12-04 VITALS — BP 115/72 | HR 77 | Temp 98.5°F | Resp 18 | Wt 262.5 lb

## 2020-12-04 DIAGNOSIS — Z87891 Personal history of nicotine dependence: Secondary | ICD-10-CM | POA: Diagnosis not present

## 2020-12-04 DIAGNOSIS — Z79899 Other long term (current) drug therapy: Secondary | ICD-10-CM | POA: Diagnosis not present

## 2020-12-04 DIAGNOSIS — Z7984 Long term (current) use of oral hypoglycemic drugs: Secondary | ICD-10-CM | POA: Diagnosis not present

## 2020-12-04 DIAGNOSIS — Z793 Long term (current) use of hormonal contraceptives: Secondary | ICD-10-CM | POA: Diagnosis not present

## 2020-12-04 DIAGNOSIS — D509 Iron deficiency anemia, unspecified: Secondary | ICD-10-CM | POA: Diagnosis not present

## 2020-12-04 DIAGNOSIS — D508 Other iron deficiency anemias: Secondary | ICD-10-CM | POA: Diagnosis not present

## 2020-12-04 DIAGNOSIS — E119 Type 2 diabetes mellitus without complications: Secondary | ICD-10-CM | POA: Diagnosis not present

## 2020-12-04 LAB — CBC WITH DIFFERENTIAL (CANCER CENTER ONLY)
Abs Immature Granulocytes: 0 10*3/uL (ref 0.00–0.07)
Basophils Absolute: 0 10*3/uL (ref 0.0–0.1)
Basophils Relative: 0 %
Eosinophils Absolute: 0.1 10*3/uL (ref 0.0–0.5)
Eosinophils Relative: 1 %
HCT: 37 % (ref 36.0–46.0)
Hemoglobin: 10.9 g/dL — ABNORMAL LOW (ref 12.0–15.0)
Immature Granulocytes: 0 %
Lymphocytes Relative: 33 %
Lymphs Abs: 2.2 10*3/uL (ref 0.7–4.0)
MCH: 20.4 pg — ABNORMAL LOW (ref 26.0–34.0)
MCHC: 29.5 g/dL — ABNORMAL LOW (ref 30.0–36.0)
MCV: 69.2 fL — ABNORMAL LOW (ref 80.0–100.0)
Monocytes Absolute: 0.5 10*3/uL (ref 0.1–1.0)
Monocytes Relative: 8 %
Neutro Abs: 3.7 10*3/uL (ref 1.7–7.7)
Neutrophils Relative %: 58 %
Platelet Count: 378 10*3/uL (ref 150–400)
RBC: 5.35 MIL/uL — ABNORMAL HIGH (ref 3.87–5.11)
RDW: 16.6 % — ABNORMAL HIGH (ref 11.5–15.5)
WBC Count: 6.5 10*3/uL (ref 4.0–10.5)
nRBC: 0 % (ref 0.0–0.2)

## 2020-12-04 LAB — RETIC PANEL
Immature Retic Fract: 11.3 % (ref 2.3–15.9)
RBC.: 5.19 MIL/uL — ABNORMAL HIGH (ref 3.87–5.11)
Retic Count, Absolute: 78.4 10*3/uL (ref 19.0–186.0)
Retic Ct Pct: 1.5 % (ref 0.4–3.1)
Reticulocyte Hemoglobin: 24.4 pg — ABNORMAL LOW (ref >27.9)

## 2020-12-04 LAB — IRON AND TIBC
Iron: 32 ug/dL — ABNORMAL LOW (ref 41–142)
Saturation Ratios: 10 % — ABNORMAL LOW (ref 21–57)
TIBC: 306 ug/dL (ref 236–444)
UIBC: 274 ug/dL (ref 120–384)

## 2020-12-04 LAB — FERRITIN: Ferritin: 32 ng/mL (ref 11–307)

## 2020-12-04 NOTE — Progress Notes (Signed)
[facility] [phone]   [phone]  HEMATOLOGY AND ONCOLOGY NOTE  Patient Care Team: Shirlean Mylar, MD as PCP - General (Family Medicine) Drema Dallas, DO as Consulting Physician (Neurology)  Hematological/Oncological History 1) 08/10/2020: Labs from [facility] PA-C from [facility] -WBC 6.5, Hgb 10.8 (L), MCV 64.5 (L), Plt 372, Ferritin 26.6, Iron 32 (L), Transferrin 237, Iron Saturation 10% (L), TIBC 332, negative hemoccult test  2) 08/27/2020: Establish care with Georga Kaufmann PA-C. Recommended ferrous sulfate 325 mg once daily.   CHIEF COMPLAINTS/PURPOSE OF CONSULTATION:  "Iron deficiency anemia "  HISTORY OF PRESENTING ILLNESS:  Christina Morton 35 y.o. female returns for follow-up for iron deficiency anemia.  Patient is unaccompanied for this visit.  She reports that her energy levels continue to be poor.  She is chronically fatigued and tries to do her basic daily activities on her own.  Her appetite has not changed.  Patient denies any nausea, vomiting or abdominal pain.  Her bowel movements are fairly regular as long as she takes MiraLAX as needed.  She denies easy bruising or signs of bleeding.  does not have a monthly menstrual cycle ever since placement of IUD.  Patient reports shortness of breath mainly with exertion but none at rest.  Patient denies any fevers, chills, night sweats, chest pain or cough. She has no other complaints. Rest of the 10 point ROS is below.   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   Pt had laparoscopic curgery for urachal cyst    SURGICAL HISTORY: Past Surgical History:   Procedure Laterality Date   CESAREAN SECTION     CESAREAN SECTION WITH BILATERAL TUBAL LIGATION Bilateral 07/21/2018   Procedure: CESAREAN SECTION WITH BILATERAL TUBAL LIGATION;  Surgeon: Kathrynn Running, MD;  Location: [facility] LD ORS;  Service: Obstetrics;  Laterality: Bilateral;   CYSTECTOMY      SOCIAL HISTORY: Social History   Socioeconomic History   Marital status: Single    Spouse name: Not on file   Number of children: 2   Years of education: Not on file   Highest education level: Some college, no degree  Occupational History   Occupation: not working  Tobacco Use   Smoking status: Former    Packs/day: 1.00    Years: 1.00    Pack years: 1.00    Types: Cigarettes    Quit date: 11/22/2017    Years since quitting: 3.0   Smokeless tobacco: Never  Vaping Use   Vaping Use: Never used  Substance and Sexual Activity   Alcohol use: Yes    Comment: once a monthly   Drug use: Yes    Types: Marijuana    Comment: occasionally, once a month   Sexual activity: Yes    Birth control/protection: I.U.D.  Other Topics Concern   Not on file  Social History Narrative   Right handed   Two story    No caffeine, maybe coffee once a week   Social Determinants of Health   Financial Resource Strain: Not on file  Food Insecurity: Not on file  Transportation Needs: Not on file  Physical Activity: Not on file  Stress: Not on file  Social Connections: Not on file  Intimate Partner Violence: Not on file    FAMILY HISTORY: Family History  Problem Relation Age of Onset   Hypertrophic cardiomyopathy Mother    Bradycardia Mother    Healthy Father    Breast cancer Maternal Great-grandmother     ALLERGIES:  is allergic to amoxicillin and penicillins.  MEDICATIONS:  Current Outpatient Medications  Medication Sig Dispense Refill   eszopiclone (LUNESTA) 1 MG TABS tablet Take 1 tablet (1 mg total) by mouth at bedtime as needed for sleep. Take immediately before bedtime 30 tablet 1    famotidine (PEPCID) 20 MG tablet Take 1 tablet (20 mg total) by mouth at bedtime. 30 tablet 1   ferrous sulfate 325 (65 FE) MG EC tablet Take 1 tablet (325 mg total) by mouth daily with breakfast. 30 tablet 3   gabapentin (NEURONTIN) 100 MG capsule Take 600 mg by mouth 2 (two) times daily.     glipiZIDE (GLUCOTROL XL) 10 MG 24 hr tablet Take 10 mg by mouth daily.     levonorgestrel (MIRENA) 20 MCG/DAY IUD by Intrauterine route.     prednisoLONE acetate (PRED FORTE) 1 % ophthalmic suspension Place 1 drop into the left eye daily.     rizatriptan (MAXALT) 10 MG tablet Take 1 tablet earliest onset of migraine.  May repeat in 2 hours if needed.  Maximum 2 tablets in 24 hours 10 tablet 3   rosuvastatin (CRESTOR) 10 MG tablet Take 1 tablet by mouth daily.     topiramate (TOPAMAX) 50 MG tablet Take 25mg  at hs for one week and then 50mg  at thereforth at hs     TRULICITY 1.5 MG/0.5ML SOPN Inject 1.5 mg into the skin once a week.     No current facility-administered medications for this visit.    REVIEW OF SYSTEMS:   Constitutional: ( - ) fevers, ( - )  chills , ( - ) night sweats Eyes: ( - ) blurriness of vision, ( - ) double vision, ( - ) watery eyes Ears, nose, mouth, throat, and face: ( - ) mucositis, ( - ) sore throat Respiratory: ( - ) cough, ( + ) dyspnea, ( - ) wheezes Cardiovascular: ( - ) palpitation, ( - ) chest discomfort, ( - ) lower extremity swelling Gastrointestinal:  ( - ) nausea, ( - ) heartburn, ( - ) change in bowel habits Skin: ( - ) abnormal skin rashes Lymphatics: ( - ) new lymphadenopathy, ( - ) easy bruising Neurological: ( - ) numbness, ( - ) tingling, ( - ) new weaknesses Behavioral/Psych: ( - ) mood change, ( - ) new changes  All other systems were reviewed with the patient and are negative.  PHYSICAL EXAMINATION: ECOG PERFORMANCE STATUS: 1 - Symptomatic but completely ambulatory  Vitals:   12/04/20 1528  BP: 115/72  Pulse: 77  Resp: 18  Temp: 98.5 F (36.9 C)   SpO2: 95%    Filed Weights   12/04/20 1528  Weight: 262 lb 8 oz (119.1 kg)     GENERAL: well appearing African American female in NAD  SKIN: skin color, texture, turgor are normal, no rashes or significant lesions EYES: conjunctiva are pink and non-injected, sclera clear OROPHARYNX: no exudate, no erythema; lips, buccal mucosa, and tongue normal  NECK: supple, non-tender LYMPH:  no palpable lymphadenopathy in the cervical, axillary or supraclavicular lymph nodes.  LUNGS: clear to auscultation and percussion with normal  breathing effort HEART: regular rate & rhythm and no murmurs and no lower extremity edema ABDOMEN: soft, non-tender, non-distended, normal bowel sounds Musculoskeletal: no cyanosis of digits and no clubbing  PSYCH: alert & oriented x 3, fluent speech NEURO: no focal motor/sensory deficits  LABORATORY DATA:  I have reviewed the data as listed CBC Latest Ref Rng & Units 12/04/2020 08/27/2020 07/22/2018  WBC 4.0 - 10.5 K/uL 6.5 6.2 8.7  Hemoglobin 12.0 - 15.0 g/dL 10.9(L) 11.1(L) 8.1(L)  Hematocrit 36.0 - 46.0 % 37.0 37.7 26.9(L)  Platelets 150 - 400 K/uL 378 402(H) 269    CMP Latest Ref Rng & Units 08/04/2019 07/23/2018 07/22/2018  Glucose 65 - 99 mg/dL 960(A) 540(J) 91  BUN 6 - 20 mg/dL 9 - -  Creatinine 8.11 - 1.00 mg/dL 9.14 - -  Sodium 782 - 144 mmol/L 139 - -  Potassium 3.5 - 5.2 mmol/L 4.2 - -  Chloride 96 - 106 mmol/L 107(H) - -  CO2 20 - 29 mmol/L 18(L) - -  Calcium 8.7 - 10.2 mg/dL 9.6 - -    ASSESSMENT & PLAN Christina Morton is a 35 y.o. female presenting to the clinic for iron deficiency anemia.   # Iron deficiency anemia, etiology unknown: --Currently on ferrous sulfate 325 mg daily.  --Labs today show hemoglobin decreased from 11.1 to 10.9. MCV 69.2. Iron saturation decreased from 13% to 10%. Iron decreased from 42 to 32.  -- Recommend IV feraheme x 2 doses.  --Continue to incorporate iron rich foods into diet.  --Patient does not have monthly  menstrual cycle since IUD placement. --Need to follow up with [facility] as patient has persistent anemia.  Need to evaluate for malabsorption versus a GI bleed. --RTC 4-6 weeks after completion of IV feraheme with repeat labs.    No orders of the defined types were placed in this encounter.   All questions were answered. The patient knows to call the clinic with any problems, questions or concerns.  I have spent a total of 25 minutes minutes of face-to-face and non-face-to-face time, preparing to see the patient, obtaining and/or reviewing separately obtained history, performing a medically appropriate examination, counseling and educating the patient, ordering medications, documenting clinical information in the electronic health record, and care coordination.    Georga Kaufmann, PA-C Department of Hematology/Oncology [facility] Phone: [phone]

## 2020-12-05 ENCOUNTER — Telehealth: Payer: Self-pay

## 2020-12-05 NOTE — Telephone Encounter (Signed)
I contacted [facility] per Georga Kaufmann, PA. I left a message with a receptionist for [facility] [place] and was advised that the message would be given to the provider to follow up. In my message to [facility], PA I advised her that the patient has consistent iron deficiency anemia and needs to be evaluated for possible blood loss vs. malabsorption. I also faxed over the patient's most recent OV notes with lab results to [facility], Attn: Bayley McMichael. I did receive a receipt of confirmation that the fax went through.

## 2020-12-06 ENCOUNTER — Ambulatory Visit: Payer: Medicaid Other | Admitting: Occupational Therapy

## 2020-12-07 ENCOUNTER — Other Ambulatory Visit: Payer: Self-pay

## 2020-12-07 ENCOUNTER — Ambulatory Visit
Admission: EM | Admit: 2020-12-07 | Discharge: 2020-12-07 | Disposition: A | Discharge status: Home / Self Care | Payer: Medicaid Other | Attending: Emergency Medicine | Admitting: Emergency Medicine

## 2020-12-07 DIAGNOSIS — N390 Urinary tract infection, site not specified: Secondary | ICD-10-CM

## 2020-12-07 DIAGNOSIS — R300 Dysuria: Secondary | ICD-10-CM

## 2020-12-07 LAB — POCT URINALYSIS DIP (MANUAL ENTRY)
Glucose, UA: NEGATIVE mg/dL
Ketones, POC UA: NEGATIVE mg/dL
Nitrite, UA: NEGATIVE
Spec Grav, UA: >=1.03 — AB (ref 1.010–1.025)
Urobilinogen, UA: 1 E.U./dL
pH, UA: 5 (ref 5.0–8.0)

## 2020-12-07 LAB — POCT URINE PREGNANCY: Preg Test, Ur: NEGATIVE

## 2020-12-07 MED ORDER — FLUCONAZOLE 150 MG PO TABS: 150.0000 mg | ORAL_TABLET | Freq: Once | ORAL | 0 refills | Status: AC

## 2020-12-07 MED ORDER — NITROFURANTOIN MONOHYD MACRO 100 MG PO CAPS: 100.0000 mg | ORAL_CAPSULE | Freq: Two times a day (BID) | ORAL | 0 refills | Status: DC

## 2020-12-07 NOTE — ED Triage Notes (Signed)
Patient presents to Urgent Care with complaints of urinary frequency since yesterday. Pt states she is diabetic and has frequent UTIs.   Denies fever, abdominal pain, or hematuria.

## 2020-12-07 NOTE — Discharge Instructions (Addendum)
Macrobid twice daily for 5 days Diflucan if needed for yeast Drink plenty of water and fluids Follow-up for any concerns

## 2020-12-09 LAB — URINE CULTURE: Culture: >=100000 — AB

## 2020-12-10 ENCOUNTER — Telehealth (HOSPITAL_COMMUNITY): Payer: Self-pay | Admitting: Emergency Medicine

## 2020-12-10 MED ORDER — SULFAMETHOXAZOLE-TRIMETHOPRIM 800-160 MG PO TABS: 1.0000 | ORAL_TABLET | Freq: Two times a day (BID) | ORAL | 0 refills | Status: AC

## 2020-12-10 NOTE — ED Provider Notes (Signed)
[facility]    CSN: [id number] Arrival date & time: 12/07/20  1446      History   Chief Complaint Chief Complaint  Patient presents with   Urinary Frequency    HPI Christina Morton is a 35 y.o. female history of DM type II, presenting today for evaluation of UTI.  Reports history of recurrent UTI secondary to her DM.  Reports over the past couple days has developed urinary frequency urgency incomplete voiding and dysuria.  Denies any discharge or vaginal discharge.  Denies fevers nausea or vomiting.  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   Pt had laparoscopic curgery for urachal cyst    Patient Active Problem List   Diagnosis Date Noted   Iron deficiency anemia 08/27/2020   Flu-like symptoms 04/06/2020   Healthcare maintenance 11/26/2019   Insomnia 01/23/2019   Cervical radiculopathy 12/08/2018   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   Depression 03/23/2018   Glaucoma 03/23/2018   Family history of heart disease 01/21/2018   Diabetes mellitus type 2 in obese (HCC) 01/21/2018    Past Surgical History:  Procedure Laterality Date   CESAREAN SECTION     CESAREAN SECTION WITH BILATERAL TUBAL LIGATION Bilateral 07/21/2018   Procedure: CESAREAN SECTION WITH BILATERAL TUBAL LIGATION;  Surgeon: Kathrynn Running, MD;  Location: [facility] LD ORS;  Service: Obstetrics;  Laterality: Bilateral;   CYSTECTOMY      OB History     Gravida  2   Para  2   Term  2   Preterm  0   AB  0   Living  2      SAB  0   IAB  0   Ectopic  0   Multiple  0   Live Births  2            Home Medications    Prior to Admission medications    Medication Sig Start Date End Date Taking? Authorizing Provider  nitrofurantoin, macrocrystal-monohydrate, (MACROBID) 100 MG capsule Take 1 capsule (100 mg total) by mouth 2 (two) times daily. 12/07/20  Yes Chriss Redel C, PA-C  eszopiclone (LUNESTA) 1 MG TABS tablet Take 1 tablet (1 mg total) by mouth at bedtime as needed for sleep. Take immediately before bedtime 08/09/19   Shirley, [place], DO  famotidine (PEPCID) 20 MG tablet Take 1 tablet (20 mg total) by mouth at bedtime. 12/08/18   Shirley, [place], DO  ferrous sulfate 325 (65 FE) MG EC tablet Take 1 tablet (325 mg total) by mouth daily with breakfast. 08/27/20   Georga Kaufmann T, PA-C  gabapentin (NEURONTIN) 100 MG capsule Take 600 mg by mouth 2 (two) times daily. 11/21/20   [provider]  glipiZIDE (GLUCOTROL XL) 10 MG 24 hr tablet Take 10 mg by mouth daily. 08/13/20   [provider]  levonorgestrel (MIRENA) 20 MCG/DAY IUD by Intrauterine route.    [provider]  prednisoLONE acetate (PRED FORTE) 1 % ophthalmic suspension Place 1 drop into the left eye daily. 11/29/20   [provider]  rizatriptan (MAXALT) 10 MG tablet Take 1 tablet earliest onset of migraine.  May repeat in 2 hours if needed.  Maximum 2 tablets in 24 hours 04/08/19   Shon Millet R, DO  rosuvastatin (CRESTOR) 10 MG tablet Take 1 tablet by mouth daily. 11/21/20   [provider]  topiramate (TOPAMAX) 50 MG tablet Take 25mg  at hs for one week and then 50mg  at thereforth at hs 08/11/19   , 08/13/19, DO  TRULICITY 1.5 MG/0.5ML SOPN Inject 1.5 mg into the skin once a week. 11/29/20   [provider]  zolpidem (AMBIEN) 5 MG tablet Take 1 tablet (5 mg total) by mouth at bedtime. 05/04/19 07/09/19  05/06/19, MD    Family History Family History  Problem Relation Age of Onset   Hypertrophic cardiomyopathy Mother    Bradycardia Mother    Healthy Father    Breast cancer Maternal Great-grandmother     Social History Social  History   Tobacco Use   Smoking status: Former    Packs/day: 1.00    Years: 1.00    Pack years: 1.00    Types: Cigarettes    Quit date: 11/22/2017    Years since quitting: 3.0   Smokeless tobacco: Never  Vaping Use   Vaping Use: Never used  Substance Use Topics   Alcohol use: Yes    Comment: once a monthly   Drug use: Yes    Types: Marijuana    Comment: occasionally, once a month     Allergies   Amoxicillin and Penicillins   Review of Systems Review of Systems  Constitutional:  Negative for fever.  Respiratory:  Negative for shortness of breath.   Cardiovascular:  Negative for chest pain.  Gastrointestinal:  Negative for abdominal pain, diarrhea, nausea and vomiting.  Genitourinary:  Positive for dysuria, frequency and urgency. Negative for flank pain, genital sores, hematuria, menstrual problem, vaginal bleeding, vaginal discharge and vaginal pain.  Musculoskeletal:  Negative for back pain.  Skin:  Negative for rash.  Neurological:  Negative for dizziness, light-headedness and headaches.    Physical Exam Triage Vital Signs ED Triage Vitals  Enc Vitals Group     BP 12/07/20 1523 113/75     Pulse Rate 12/07/20 1523 100     Resp 12/07/20 1523 16     Temp 12/07/20 1523 98.2 F (36.8 C)     Temp Source 12/07/20 1523 Oral     SpO2 12/07/20 1523 98 %     Weight --      Height --      Head Circumference --      Peak Flow --      Pain Score 12/07/20 1519 0     Pain Loc --      Pain Edu? --      Excl. in GC? --    No data found.  Updated Vital Signs BP 113/75 (BP Location: Right Arm)   Pulse 100   Temp 98.2 F (36.8 C) (Oral)   Resp 16   SpO2 98%   Visual Acuity Right Eye Distance:   Left Eye Distance:   Bilateral Distance:    Right Eye Near:   Left Eye Near:    Bilateral Near:     Physical Exam Vitals and nursing note reviewed.  Constitutional:      Appearance: She is well-developed.     Comments: No acute distress  HENT:     Head:  Normocephalic and atraumatic.     Nose: Nose normal.  Eyes:     Conjunctiva/sclera: Conjunctivae normal.  Cardiovascular:  Rate and Rhythm: Normal rate.  Pulmonary:     Effort: Pulmonary effort is normal. No respiratory distress.  Abdominal:     General: There is no distension.  Musculoskeletal:        General: Normal range of motion.     Cervical back: Neck supple.  Skin:    General: Skin is warm and dry.  Neurological:     Mental Status: She is alert and oriented to person, place, and time.     UC Treatments / Results  Labs (all labs ordered are listed, but only abnormal results are displayed) Labs Reviewed  URINE CULTURE - Abnormal; Notable for the following components:      Result Value   Culture >=100,000 COLONIES/mL PROTEUS MIRABILIS (*)    Organism ID, Bacteria PROTEUS MIRABILIS (*)    All other components within normal limits  POCT URINALYSIS DIP (MANUAL ENTRY) - Abnormal; Notable for the following components:   Clarity, UA cloudy (*)    Bilirubin, UA small (*)    Spec Grav, UA >=1.030 (*)    Blood, UA trace-lysed (*)    Protein Ur, POC trace (*)    Leukocytes, UA Small (1+) (*)    All other components within normal limits  POCT URINE PREGNANCY    EKG   Radiology No results found.  Procedures Procedures (including critical care time)  Medications Ordered in UC Medications - No data to display  Initial Impression / Assessment and Plan / UC Course  I have reviewed the triage vital signs and the nursing notes.  Pertinent labs & imaging results that were available during my care of the patient were reviewed by me and considered in my medical decision making (see chart for details).     UA consistent with UTI, empirically treating with Macrobid, also provided Diflucan for after antibiotic use.  Urine culture pending, will alter therapy based off sensitivities if needed.  Discussed strict return precautions. Patient verbalized understanding and is  agreeable with plan.  Final Clinical Impressions(s) / UC Diagnoses   Final diagnoses:  Dysuria  Lower urinary tract infection, acute     Discharge Instructions      Macrobid twice daily for 5 days Diflucan if needed for yeast Drink plenty of water and fluids Follow-up for any concerns     ED Prescriptions     Medication Sig Dispense Auth. Provider   nitrofurantoin, macrocrystal-monohydrate, (MACROBID) 100 MG capsule Take 1 capsule (100 mg total) by mouth 2 (two) times daily. 10 capsule Dalen Hennessee C, PA-C   fluconazole (DIFLUCAN) 150 MG tablet Take 1 tablet (150 mg total) by mouth once for 1 dose. 2 tablet Celester Morgan, [place]all [place] C, PA-C      PDMP not reviewed this encounter.   Sharyon Cable [place] C, [place] 12/10/20 [phone]

## 2020-12-11 ENCOUNTER — Encounter (INDEPENDENT_AMBULATORY_CARE_PROVIDER_SITE_OTHER): Payer: Medicaid Other | Admitting: Ophthalmology

## 2020-12-12 ENCOUNTER — Other Ambulatory Visit: Payer: Self-pay

## 2020-12-12 ENCOUNTER — Ambulatory Visit: Payer: Medicaid Other | Admitting: Occupational Therapy

## 2020-12-12 ENCOUNTER — Encounter (INDEPENDENT_AMBULATORY_CARE_PROVIDER_SITE_OTHER): Payer: Medicaid Other | Admitting: Ophthalmology

## 2020-12-12 DIAGNOSIS — H33302 Unspecified retinal break, left eye: Secondary | ICD-10-CM

## 2020-12-14 ENCOUNTER — Ambulatory Visit: Payer: Medicaid Other | Attending: Internal Medicine | Admitting: Occupational Therapy

## 2020-12-14 ENCOUNTER — Other Ambulatory Visit: Payer: Self-pay

## 2020-12-14 ENCOUNTER — Encounter: Payer: Self-pay | Admitting: Occupational Therapy

## 2020-12-14 DIAGNOSIS — R278 Other lack of coordination: Secondary | ICD-10-CM | POA: Diagnosis present

## 2020-12-14 DIAGNOSIS — R208 Other disturbances of skin sensation: Secondary | ICD-10-CM | POA: Diagnosis present

## 2020-12-14 DIAGNOSIS — M79642 Pain in left hand: Secondary | ICD-10-CM

## 2020-12-14 DIAGNOSIS — M79641 Pain in right hand: Secondary | ICD-10-CM | POA: Diagnosis present

## 2020-12-14 DIAGNOSIS — M6281 Muscle weakness (generalized): Secondary | ICD-10-CM | POA: Diagnosis present

## 2020-12-14 NOTE — Therapy (Signed)
[facility] [address] [place], [place], [address] Phone: [phone]   Fax:  [phone]  Occupational Therapy Evaluation  Patient Details  Name: Christina Morton MRN: [id number] Date of Birth: March 22, 1986 Referring Provider (OT): Dr. Corky Downs   Encounter Date: 12/14/2020   OT End of Session - 12/14/20 1414     Visit Number 1    Number of Visits 13    Date for OT Re-Evaluation 03/15/21    Authorization Type Medicaid    Authorization Time Period POC written for 12 weeks to account for scheduleing anticipate seeing pt for 7-8 weeks    OT Start Time 0850    OT Stop Time 0920    OT Time Calculation (min) 30 min             Past Medical History:  Diagnosis Date   Anemia    Depression    Glaucoma    History of cesarean section, classical 01/21/2018   Low transverse with 5 cm extension into muscular layer--was told at [facility] risk of rupture--will treat as prior classical   Mental disorder    Type 2 diabetes mellitus without complications (HCC)    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   Subjective Assessment - 12/14/20 0853     Subjective  Pt reports pain in both hands and wrists    Patient Stated Goals to hold things without dropping    Currently in Pain? Yes    Pain Score 5     Pain Location Hand    Pain Orientation Right;Left    Pain Descriptors / Indicators Aching;Burning    Pain Type Chronic pain    Pain Onset More than a month ago    Pain Frequency Intermittent    Aggravating Factors  use    Pain  Relieving Factors rest               [facility] OT Assessment - 12/14/20 0855       Assessment   Medical Diagnosis carpal tunnel syndrome bilateral    Referring Provider (OT) Dr. Corky Downs    Onset Date/Surgical Date 11/28/20    Hand Dominance Right      Precautions   Precaution Comments sensory impairment in digitis      Home  Environment   Family/patient expects to be discharged to: Private residence    Lives With Significant other;Son;Daughter      Prior Function   Level of Independence Independent    Vocation Unemployed    Leisure read, watching kids sports      ADL   Eating/Feeding Needs assist with cutting food   increased spills   Grooming Modified independent    Upper Body Bathing Modified independent    Lower Body Bathing Modified independent    Upper Body Dressing Independent    Lower Body Dressing Modified independent    Toilet Transfer Modified independent    Tub/Shower Transfer Modified independent    ADL comments Pt reports  it takes her an hour to bathe and dress. Pt reports dropping items,  difficulty cutting food and fastening her ponytail      IADL   Shopping --   husband handles   Light Housekeeping Performs light daily tasks such as dishwashing, bed making    Meal Prep Able to complete simple warm meal prep   increased time and difficulty     Mobility   Mobility Status Independent      Written Expression   Dominant Hand Right    Handwriting 100% legible;Increased time   requires rest breaks     Vision - History   Visual History Glaucoma      Vision Assessment   Vision Assessment Vision not tested      Activity Tolerance   Activity Tolerance Comments Pt's arms fatigue quickly, requires frequent rest breaks      Cognition   Overall Cognitive Status Within Functional Limits for tasks assessed      Sensation   Light Touch Impaired Detail    Hot/Cold Impaired by gross assessment      Coordination   Gross Motor Movements are Fluid and  Coordinated No    Fine Motor Movements are Fluid and Coordinated No    9 Hole Peg Test Right;Left    Right 9 Hole Peg Test 32.71    Left 9 Hole Peg Test 24.91      ROM / Strength   AROM / PROM / Strength AROM      AROM   Overall AROM Comments Wrist flexion extension and composite finger flexion are WFLS, mildly decreased finger ext      Hand Function   Right Hand Grip (lbs) 27.7    Left Hand Grip (lbs) 24.91                             OT Education - 12/14/20 1420     Education Details d- ring splint wear, care and precautions, splint issued by therapist    Person(s) Educated Patient    Methods Explanation;Demonstration;Handout    Comprehension Verbalized understanding;Returned demonstration              OT Short Term Goals - 12/14/20 0927       OT SHORT TERM GOAL #1   Title I with inital HEP.    Baseline dependent    Time 4    Period Weeks    Status New      OT SHORT TERM GOAL #2   Title I with splint wear care and precautions    Baseline dependent splint issued on initial visit    Time 4    Period Weeks      OT SHORT TERM GOAL #3   Title I with activity modification/ positioning to minimize pain, and risk for injury    Baseline dependent    Time 4    Period Weeks    Status New      OT SHORT TERM GOAL #4   Title I with precautions related to sensory impairment    Baseline dependent    Time 4    Period Weeks    Status New               OT Long Term Goals - 12/14/20 [id number]       OT LONG TERM GOAL #1   Title Pt will report increased ease with carrying items with less drops, cutting food and fastening her ponytail.    Baseline Pt reports dropping items, difficulty cutting food, and  fastening her ponytail    Time 12    Period Weeks    Status New    Target Date 03/08/21      OT LONG TERM GOAL #2   Title Pt will demonstrate ability to write a sentence legibly without taking a rest break with adaptations prn    Baseline Pt is  able to write a senentence legibly but she requires rest breaks    Time 12    Period Weeks    Status New      OT LONG TERM GOAL #3   Title Pt will demonstrate improved fine motor coordination for ADLs  in RUE as evidenced by decreasing 9 hole peg test  by 3 secs    Baseline RUE 32.71 LUE 24.91    Time 12    Period Weeks    Status New      OT LONG TERM GOAL #4   Title I with updated HEP prn    Baseline dependent    Time 12    Period Weeks    Status New                   Plan - 12/14/20 1415     Clinical Impression Statement Pt is a 35 y.o female with diagnosis of bilateral  carpal tunnel syndrom. PMH DM, anemia, depression, galucoma. Pt presents with the following deficits: decreased strength, pain, sensory deficits, decreased coordination, ecreased UE functional use which impedes performance of ADLs/IADLS. Pt can benefit from skilled occupational therapy to address these deficits.    OT Occupational Profile and History Problem Focused Assessment - Including review of records relating to presenting problem    Occupational performance deficits (Please refer to evaluation for details): ADL's;IADL's;Work;Leisure;Social Participation    Body Structure / Function / Physical Skills ADL;UE functional use;Flexibility;Pain;FMC;ROM;GMC;Coordination;Decreased knowledge of precautions;Sensation;Decreased knowledge of use of DME;IADL;Dexterity;Strength    Rehab Potential Good    Clinical Decision Making Limited treatment options, no task modification necessary    Comorbidities Affecting Occupational Performance: May have comorbidities impacting occupational performance    Modification or Assistance to Complete Evaluation  No modification of tasks or assist necessary to complete eval    OT Frequency 1x / week    OT Duration 12 weeks   1x week x 3 weeks followed by 1x week for 9 weeks prn   OT Treatment/Interventions Self-care/ADL training;Ultrasound;Energy conservation;Patient/family  education;DME and/or AE instruction;Paraffin;Passive range of motion;Fluidtherapy;Cryotherapy;Contrast Bath;Splinting;Therapeutic activities;Manual Therapy;Therapeutic exercise;Moist Heat;Neuromuscular education    Plan initate HEP, check to see how splint wear is going, fluido vs paraffin    Consulted and Agree with Plan of Care Patient             Patient will benefit from skilled therapeutic intervention in order to improve the following deficits and impairments:   Body Structure / Function / Physical Skills: ADL, UE functional use, Flexibility, Pain, FMC, ROM, GMC, Coordination, Decreased knowledge of precautions, Sensation, Decreased knowledge of use of DME, IADL, Dexterity, Strength       Visit Diagnosis: Muscle weakness (generalized)  Other lack of coordination  Pain in left hand  Pain in right hand  Other disturbances of skin sensation    Problem List Patient Active Problem List   Diagnosis Date Noted   Iron deficiency anemia 08/27/2020   Flu-like symptoms 04/06/2020   Healthcare maintenance 11/26/2019   Insomnia 01/23/2019   Cervical radiculopathy 12/08/2018   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 [place], [place], [address] Phone: [phone]   Fax:  [phone]  Name: Christina Morton MRN: [id number] Date of Birth: 01-Nov-1985

## 2020-12-14 NOTE — Patient Instructions (Signed)
Wear wrist braces at night and during house work that requires repetitive use of wrist and hands. Stop wearing braces if increased pressure or pain

## 2020-12-26 ENCOUNTER — Telehealth: Payer: Self-pay | Admitting: Physician Assistant

## 2020-12-26 NOTE — Telephone Encounter (Signed)
Scheduled appointment per provider. Left message. 

## 2020-12-31 ENCOUNTER — Ambulatory Visit: Payer: Medicaid Other | Admitting: Occupational Therapy

## 2021-01-01 ENCOUNTER — Other Ambulatory Visit: Payer: Self-pay

## 2021-01-01 ENCOUNTER — Ambulatory Visit: Payer: Medicaid Other | Attending: Internal Medicine | Admitting: Occupational Therapy

## 2021-01-01 DIAGNOSIS — M79641 Pain in right hand: Secondary | ICD-10-CM | POA: Insufficient documentation

## 2021-01-01 DIAGNOSIS — M79642 Pain in left hand: Secondary | ICD-10-CM | POA: Insufficient documentation

## 2021-01-01 DIAGNOSIS — R278 Other lack of coordination: Secondary | ICD-10-CM | POA: Diagnosis present

## 2021-01-01 DIAGNOSIS — R208 Other disturbances of skin sensation: Secondary | ICD-10-CM

## 2021-01-01 DIAGNOSIS — M6281 Muscle weakness (generalized): Secondary | ICD-10-CM | POA: Diagnosis present

## 2021-01-01 NOTE — Therapy (Signed)
[facility] [address] [place], [place], [address] Phone: [phone]   Fax:  [phone]  Occupational Therapy Treatment  Patient Details  Name: Christina Morton MRN: [id number] Date of Birth: July 17, 1985 Referring Provider (OT): Dr. Corky Downs   Encounter Date: 01/01/2021   OT End of Session - 01/01/21 1022     Visit Number 2    Number of Visits 13    Date for OT Re-Evaluation 03/15/21    Authorization Type Medicaid    Authorization Time Period POC written for 12 weeks to account for scheduleing anticipate seeing pt for 7-8 weeks    OT Start Time 1015    OT Stop Time 1100    OT Time Calculation (min) 45 min    Activity Tolerance Patient tolerated treatment well    Behavior During Therapy [facility] for tasks assessed/performed             Past Medical History:  Diagnosis Date   Anemia    Depression    Glaucoma    History of cesarean section, classical 01/21/2018   Low transverse with 5 cm extension into muscular layer--was told at [facility] risk of rupture--will treat as prior classical   Mental disorder    Type 2 diabetes mellitus without complications (HCC)    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   Subjective Assessment - 01/01/21 1021     Subjective  The brace feels to heavy    Patient Stated Goals to hold things without dropping    Currently in Pain? Yes    Pain Score 8     Pain Location Hand    Pain Orientation Right;Left    Pain Descriptors / Indicators Aching;Burning    Pain Type Chronic pain    Pain  Onset More than a month ago    Pain Frequency Intermittent    Aggravating Factors  use    Pain Relieving Factors rest                          OT Treatments/Exercises (OP) - 01/01/21 0001       ADLs   ADL Comments Discussed safety considerations d/t lack of sensation in hands - recommended travel mug, cut resistant glove, and looking at hands when using them, as well as checking temperature of water with uninvolved body part. Issued foam to increase ease and decrease pain with writing and cutting food. Discussed possible referral to hand specialist and potential need for surgery since chronic in nature. Discussed modifications for holding pots/pans and distributing weight to larger joints when possible, keeping wrist neutral when possible, and avoiding sustained and repetitive gripping/pinching.      Exercises   Exercises --   Pt issued CTS conservative therapy program HEP and lower median n. gliding HEP - Therapist discussed/reviewed and pt demo each     Modalities   Modalities Fluidotherapy      RUE Fluidotherapy   Number Minutes Fluidotherapy 10 Minutes    RUE Fluidotherapy Location Hand;Wrist    Comments  at beginning of session to decrease pain   if it helps, will do bilateral hands next session                     OT Short Term Goals - 01/01/21 1023       OT SHORT TERM GOAL #1   Title I with inital HEP.    Baseline dependent    Time 4    Period Weeks    Status On-going      OT SHORT TERM GOAL #2   Title I with splint wear care and precautions    Baseline dependent splint issued on initial visit    Time 4    Period Weeks    Status On-going      OT SHORT TERM GOAL #3   Title I with activity modification/ positioning to minimize pain, and risk for injury    Baseline dependent    Time 4    Period Weeks    Status On-going      OT SHORT TERM GOAL #4   Title I with precautions related to sensory impairment    Baseline dependent    Time 4     Period Weeks    Status New               OT Long Term Goals - 12/14/20 0626       OT LONG TERM GOAL #1   Title Pt will report increased ease with carrying items with less drops, cutting food and fastening her ponytail.    Baseline Pt reports dropping items, difficulty cutting food, and fastening her ponytail    Time 12    Period Weeks    Status New    Target Date 03/08/21      OT LONG TERM GOAL #2   Title Pt will demonstrate ability to write a sentence legibly without taking a rest break with adaptations prn    Baseline Pt is able to write a senentence legibly but she requires rest breaks    Time 12    Period Weeks    Status New      OT LONG TERM GOAL #3   Title Pt will demonstrate improved fine motor coordination for ADLs  in RUE as evidenced by decreasing 9 hole peg test  by 3 secs    Baseline RUE 32.71 LUE 24.91    Time 12    Period Weeks    Status New      OT LONG TERM GOAL #4   Title I with updated HEP prn    Baseline dependent    Time 12    Period Weeks    Status New                   Plan - 01/01/21 1138     Clinical Impression Statement Pt progressing towards STG's. Pt with increased understanding of task modifications and A/E    OT Occupational Profile and History Problem Focused Assessment - Including review of records relating to presenting problem    Occupational performance deficits (Please refer to evaluation for details): ADL's;IADL's;Work;Leisure;Social Participation    Body Structure / Function / Physical Skills ADL;UE functional use;Flexibility;Pain;FMC;ROM;GMC;Coordination;Decreased knowledge of precautions;Sensation;Decreased knowledge of use of DME;IADL;Dexterity;Strength    Rehab Potential Good    Clinical Decision Making Limited treatment options, no task modification necessary    Comorbidities Affecting Occupational Performance: May have comorbidities impacting occupational performance    Modification or Assistance to Complete  Evaluation  No modification of tasks or assist necessary to complete eval    OT Frequency 1x / week    OT Duration 12 weeks   1x week x 3 weeks followed by 1x week for 9 weeks prn   OT Treatment/Interventions Self-care/ADL training;Ultrasound;Energy conservation;Patient/family education;DME and/or AE instruction;Paraffin;Passive range of motion;Fluidtherapy;Cryotherapy;Contrast Bath;Splinting;Therapeutic activities;Manual Therapy;Therapeutic exercise;Moist Heat;Neuromuscular education    Plan continue fluido, review HEP's, check D-ring brace and possibly take metal stay out if pt hasn't gotten neoprene brace    Consulted and Agree with Plan of Care Patient             Patient will benefit from skilled therapeutic intervention in order to improve the following deficits and impairments:   Body Structure / Function / Physical Skills: ADL, UE functional use, Flexibility, Pain, FMC, ROM, GMC, Coordination, Decreased knowledge of precautions, Sensation, Decreased knowledge of use of DME, IADL, Dexterity, Strength       Visit Diagnosis: Pain in left hand  Pain in right hand  Muscle weakness (generalized)  Other disturbances of skin sensation    Problem List Patient Active Problem List   Diagnosis Date Noted   Iron deficiency anemia 08/27/2020   Flu-like symptoms 04/06/2020   Healthcare maintenance 11/26/2019   Insomnia 01/23/2019   Cervical radiculopathy 12/08/2018   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

## 2021-01-02 ENCOUNTER — Encounter: Payer: Medicaid Other | Admitting: Occupational Therapy

## 2021-01-07 ENCOUNTER — Other Ambulatory Visit: Payer: Self-pay

## 2021-01-07 ENCOUNTER — Inpatient Hospital Stay: Payer: Medicaid Other | Attending: Physician Assistant

## 2021-01-07 VITALS — BP 107/71 | HR 93 | Temp 97.8°F | Resp 18

## 2021-01-07 DIAGNOSIS — D508 Other iron deficiency anemias: Secondary | ICD-10-CM

## 2021-01-07 DIAGNOSIS — D509 Iron deficiency anemia, unspecified: Secondary | ICD-10-CM | POA: Diagnosis present

## 2021-01-07 MED ORDER — SODIUM CHLORIDE 0.9 % IV SOLN
510.0000 mg | Freq: Once | INTRAVENOUS | Status: AC
  Administered 2021-01-07: 510 mg via INTRAVENOUS
  Filled 2021-01-07: qty 510

## 2021-01-07 MED ORDER — SODIUM CHLORIDE 0.9 % IV SOLN: Freq: Once | INTRAVENOUS | Status: AC

## 2021-01-07 NOTE — Patient Instructions (Signed)

## 2021-01-08 ENCOUNTER — Ambulatory Visit: Payer: Medicaid Other | Admitting: Occupational Therapy

## 2021-01-08 DIAGNOSIS — R208 Other disturbances of skin sensation: Secondary | ICD-10-CM

## 2021-01-08 DIAGNOSIS — M79641 Pain in right hand: Secondary | ICD-10-CM

## 2021-01-08 DIAGNOSIS — M6281 Muscle weakness (generalized): Secondary | ICD-10-CM

## 2021-01-08 DIAGNOSIS — M79642 Pain in left hand: Secondary | ICD-10-CM | POA: Diagnosis not present

## 2021-01-08 DIAGNOSIS — R278 Other lack of coordination: Secondary | ICD-10-CM

## 2021-01-08 NOTE — Therapy (Addendum)
[facility] [address] [place], [place], [address] Phone: [phone]   Fax:  [phone]  Occupational Therapy Treatment  Patient Details  Name: Christina Morton MRN: [id number] Date of Birth: 1985-09-20 Referring Provider (OT): Dr. Corky Downs   Encounter Date: 01/08/2021   OT End of Session - 01/08/21 1336     Visit Number 3    Number of Visits 13    Date for OT Re-Evaluation 03/15/21    Authorization Type Medicaid    Authorization Time Period POC written for 12 weeks to account for scheduleing anticipate seeing pt for 7-8 weeks    Authorization - Visit Number 2    Authorization - Number of Visits 3    OT Start Time 1320    OT Stop Time 1358    OT Time Calculation (min) 38 min    Activity Tolerance Patient tolerated treatment well    Behavior During Therapy [facility] for tasks assessed/performed             Past Medical History:  Diagnosis Date   Anemia    Depression    Glaucoma    History of cesarean section, classical 01/21/2018   Low transverse with 5 cm extension into muscular layer--was told at [facility] risk of rupture--will treat as prior classical   Mental disorder    Type 2 diabetes mellitus without complications (HCC)    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   Subjective Assessment - 01/08/21 1321     Patient Stated Goals to hold things without dropping    Currently in Pain? Yes    Pain Score 7     Pain Orientation Right;Left    Pain Descriptors / Indicators Aching    Pain Type Chronic pain     Pain Onset More than a month ago    Pain Frequency Intermittent    Aggravating Factors  use    Pain Relieving Factors rest                          Treatment: Hot pack applied to right and left UE's alternately for grossly 7 mins each,for pain and stiffness, while performing exercises with other hand.No adverse reactions.         OT Treatment/ Education - 01/08/21 1322     Education Details Therapist reviewed medium n. gliding exercises, 5 reps each, min v.c, reveiwed exercises 1, 2 5, 6, 7 from conservative management of PD    Person(s) Educated Patient    Methods Explanation;Demonstration;Handout                 OT Short Term Goals - 01/15/21 1335       OT SHORT TERM GOAL #1   Title I with inital HEP.    Baseline dependent    Time 4    Period Weeks    Status Achieved      OT SHORT TERM GOAL #2   Title I with splint wear care and precautions    Time 4  Period Weeks    Status Achieved   splints issued and pt educated in wear     OT SHORT TERM GOAL #3   Title I with activity modification/ positioning to minimize pain, and risk for injury    Baseline needs reinforcement of activity modification    Time 4    Period Weeks    Status On-going      OT SHORT TERM GOAL #4   Title I with precautions related to sensory impairment    Baseline needs continued education/ reinforcement of sensory precautions    Time 4    Period Weeks    Status On-going               OT Long Term Goals - 01/15/21 1337       OT LONG TERM GOAL #1   Title Pt will report increased ease with carrying items with less drops, cutting food and fastening her ponytail.    Baseline Pt reports dropping items, difficulty cutting food, and fastening her ponytail    Time 12    Period Weeks    Status On-going      OT LONG TERM GOAL #2   Title Pt will demonstrate ability to write a sentence legibly without taking a rest break with adaptations prn    Baseline Pt is able  to write a sentence legibly but she requires rest breaks    Time 12    Period Weeks    Status On-going      OT LONG TERM GOAL #3   Title Pt will demonstrate improved fine motor coordination for ADLs  in RUE as evidenced by decreasing 9 hole peg test  by 3 secs    Baseline RUE 32.71 LUE 24.91    Time 12    Period Weeks    Status On-going   not retested as pt cancelled her most recent appt.     OT LONG TERM GOAL #4   Title I with updated HEP prn    Baseline dependent    Time 12    Period Weeks    Status On-going   pt is perfroming inital HEP                    Plan - 01/08/21 1337     Clinical Impression Statement Pt progressing towards STG's. Pt demonstrates understanding of HEP following reveiw.    OT Occupational Profile and History Problem Focused Assessment - Including review of records relating to presenting problem    Occupational performance deficits (Please refer to evaluation for details): ADL's;IADL's;Work;Leisure;Social Participation    Body Structure / Function / Physical Skills ADL;UE functional use;Flexibility;Pain;FMC;ROM;GMC;Coordination;Decreased knowledge of precautions;Sensation;Decreased knowledge of use of DME;IADL;Dexterity;Strength    Rehab Potential Good    Clinical Decision Making Limited treatment options, no task modification necessary    Comorbidities Affecting Occupational Performance: May have comorbidities impacting occupational performance    Modification or Assistance to Complete Evaluation  No modification of tasks or assist necessary to complete eval    OT Frequency 1x / week    OT Duration 12 weeks   1x week x 3 weeks followed by 1x week for 9 weeks prn   OT Treatment/Interventions Self-care/ADL training;Ultrasound;Energy conservation;Patient/family education;DME and/or AE instruction;Paraffin;Passive range of motion;Fluidtherapy;Cryotherapy;Contrast Bath;Splinting;Therapeutic activities;Manual Therapy;Therapeutic exercise;Moist  Heat;Neuromuscular education    Plan request more visits from Medicaid continue fluido, review HEP's, check D-ring brace and possibly take metal stay out if pt hasn't gotten neoprene brace  Consulted and Agree with Plan of Care Patient             Patient will benefit from skilled therapeutic intervention in order to improve the following deficits and impairments:   Body Structure / Function / Physical Skills: ADL, UE functional use, Flexibility, Pain, FMC, ROM, GMC, Coordination, Decreased knowledge of precautions, Sensation, Decreased knowledge of use of DME, IADL, Dexterity, Strength       Visit Diagnosis: Pain in left hand  Pain in right hand  Muscle weakness (generalized)  Other disturbances of skin sensation  Other lack of coordination    Problem List Patient Active Problem List   Diagnosis Date Noted   Iron deficiency anemia 08/27/2020   Flu-like symptoms 04/06/2020   Healthcare maintenance 11/26/2019   Insomnia 01/23/2019   Cervical radiculopathy 12/08/2018   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 [place], [place], [address] Phone: [phone]   Fax:  [phone]  Name: Ahriana Gunkel MRN: [id number] Date of Birth: May 31, 1985

## 2021-01-14 ENCOUNTER — Other Ambulatory Visit: Payer: Self-pay

## 2021-01-14 ENCOUNTER — Inpatient Hospital Stay: Payer: Medicaid Other

## 2021-01-14 VITALS — BP 98/70 | HR 88 | Temp 98.7°F | Resp 18

## 2021-01-14 DIAGNOSIS — D508 Other iron deficiency anemias: Secondary | ICD-10-CM

## 2021-01-14 DIAGNOSIS — D509 Iron deficiency anemia, unspecified: Secondary | ICD-10-CM | POA: Diagnosis not present

## 2021-01-14 MED ORDER — SODIUM CHLORIDE 0.9 % IV SOLN
510.0000 mg | Freq: Once | INTRAVENOUS | Status: AC
  Administered 2021-01-14: 510 mg via INTRAVENOUS
  Filled 2021-01-14: qty 510

## 2021-01-14 MED ORDER — SODIUM CHLORIDE 0.9 % IV SOLN: Freq: Once | INTRAVENOUS | Status: AC

## 2021-01-14 NOTE — Patient Instructions (Signed)

## 2021-01-15 ENCOUNTER — Ambulatory Visit: Payer: Medicaid Other | Admitting: Occupational Therapy

## 2021-01-21 ENCOUNTER — Ambulatory Visit: Payer: Medicaid Other | Admitting: Family Medicine

## 2021-01-29 ENCOUNTER — Ambulatory Visit: Payer: Medicaid Other | Attending: Internal Medicine | Admitting: Occupational Therapy

## 2021-02-05 ENCOUNTER — Ambulatory Visit: Payer: Medicaid Other | Admitting: Occupational Therapy

## 2021-02-12 ENCOUNTER — Ambulatory Visit: Payer: Medicaid Other | Admitting: Occupational Therapy

## 2021-02-13 ENCOUNTER — Other Ambulatory Visit: Payer: Self-pay

## 2021-02-13 ENCOUNTER — Inpatient Hospital Stay: Payer: Medicaid Other | Attending: Physician Assistant

## 2021-02-13 ENCOUNTER — Inpatient Hospital Stay (HOSPITAL_BASED_OUTPATIENT_CLINIC_OR_DEPARTMENT_OTHER): Payer: Medicaid Other | Admitting: Physician Assistant

## 2021-02-13 ENCOUNTER — Other Ambulatory Visit: Payer: Self-pay | Admitting: Physician Assistant

## 2021-02-13 VITALS — BP 106/74 | HR 82 | Temp 99.1°F | Resp 17 | Wt 260.2 lb

## 2021-02-13 DIAGNOSIS — Z79899 Other long term (current) drug therapy: Secondary | ICD-10-CM | POA: Insufficient documentation

## 2021-02-13 DIAGNOSIS — D508 Other iron deficiency anemias: Secondary | ICD-10-CM | POA: Diagnosis not present

## 2021-02-13 DIAGNOSIS — E119 Type 2 diabetes mellitus without complications: Secondary | ICD-10-CM | POA: Diagnosis not present

## 2021-02-13 DIAGNOSIS — Z7984 Long term (current) use of oral hypoglycemic drugs: Secondary | ICD-10-CM | POA: Diagnosis not present

## 2021-02-13 DIAGNOSIS — Z793 Long term (current) use of hormonal contraceptives: Secondary | ICD-10-CM | POA: Diagnosis not present

## 2021-02-13 DIAGNOSIS — D509 Iron deficiency anemia, unspecified: Secondary | ICD-10-CM | POA: Insufficient documentation

## 2021-02-13 LAB — CBC WITH DIFFERENTIAL (CANCER CENTER ONLY)
Abs Immature Granulocytes: 0.02 10*3/uL (ref 0.00–0.07)
Basophils Absolute: 0 10*3/uL (ref 0.0–0.1)
Basophils Relative: 0 %
Eosinophils Absolute: 0.1 10*3/uL (ref 0.0–0.5)
Eosinophils Relative: 1 %
HCT: 38.2 % (ref 36.0–46.0)
Hemoglobin: 11.5 g/dL — ABNORMAL LOW (ref 12.0–15.0)
Immature Granulocytes: 0 %
Lymphocytes Relative: 38 %
Lymphs Abs: 2.7 10*3/uL (ref 0.7–4.0)
MCH: 21.1 pg — ABNORMAL LOW (ref 26.0–34.0)
MCHC: 30.1 g/dL (ref 30.0–36.0)
MCV: 70 fL — ABNORMAL LOW (ref 80.0–100.0)
Monocytes Absolute: 0.6 10*3/uL (ref 0.1–1.0)
Monocytes Relative: 8 %
Neutro Abs: 3.6 10*3/uL (ref 1.7–7.7)
Neutrophils Relative %: 53 %
Platelet Count: 367 10*3/uL (ref 150–400)
RBC: 5.46 MIL/uL — ABNORMAL HIGH (ref 3.87–5.11)
RDW: 18.8 % — ABNORMAL HIGH (ref 11.5–15.5)
WBC Count: 7 10*3/uL (ref 4.0–10.5)
nRBC: 0 % (ref 0.0–0.2)

## 2021-02-13 LAB — IRON AND TIBC
Iron: 56 ug/dL (ref 41–142)
Saturation Ratios: 23 % (ref 21–57)
TIBC: 249 ug/dL (ref 236–444)
UIBC: 192 ug/dL (ref 120–384)

## 2021-02-13 LAB — RETIC PANEL
Immature Retic Fract: 11.6 % (ref 2.3–15.9)
RBC.: 5.54 MIL/uL — ABNORMAL HIGH (ref 3.87–5.11)
Retic Count, Absolute: 102.5 10*3/uL (ref 19.0–186.0)
Retic Ct Pct: 1.9 % (ref 0.4–3.1)
Reticulocyte Hemoglobin: 23.9 pg — ABNORMAL LOW (ref >27.9)

## 2021-02-13 LAB — FERRITIN: Ferritin: 256 ng/mL (ref 11–307)

## 2021-02-13 NOTE — Progress Notes (Signed)
[facility] [phone]   [phone]  PROGRESS NOTE  Patient Care Team: Shirlean Mylar, MD as PCP - General (Family Medicine) Drema Dallas, DO as Consulting Physician (Neurology)  Hematological/Oncological History 1) 08/10/2020: Labs from [facility] PA-C from [facility] -WBC 6.5, Hgb 10.8 (L), MCV 64.5 (L), Plt 372, Ferritin 26.6, Iron 32 (L), Transferrin 237, Iron Saturation 10% (L), TIBC 332, negative hemoccult test  2) 08/27/2020: Establish care with Georga Kaufmann PA-C. Recommended ferrous sulfate 325 mg once daily.   3) Received IV feraheme x 2 doses on 01/07/2021 and 01/14/2021.   CHIEF COMPLAINTS/PURPOSE OF CONSULTATION:  "Iron deficiency anemia "  HISTORY OF PRESENTING ILLNESS:  Christina Morton 35 y.o. female returns for follow-up for iron deficiency anemia.  Patient is unaccompanied for this visit.  She reports persistent fatigue and adds that she can't drive long distance for fear of falling asleep behind the wheel. She reports her appetite is fairly stable.  She denies any recent weight changes.  Patient denies any nausea, vomiting or abdominal pain.  Her bowel movements are unchanged and takes stool softeners/laxatives as needed.  She denies easy bruising or signs of bleeding.  She reports that due to having an IUD, she does not have a menstrual cycle.  She reports intermittent episodes of shortness of breath with exertion but not at rest.  Patient denies any fevers, chills, night sweats, chest pain or cough.  He has no other complaints.  Rest of the 10 point ROS is below.  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   Pt had laparoscopic curgery for urachal cyst    SURGICAL HISTORY: Past Surgical History:  Procedure Laterality Date   CESAREAN SECTION     CESAREAN SECTION WITH BILATERAL TUBAL LIGATION Bilateral 07/21/2018   Procedure: CESAREAN SECTION WITH BILATERAL TUBAL LIGATION;  Surgeon: Kathrynn Running, MD;  Location: [facility] LD [facility]S;  Service: Obstetrics;  Laterality: Bilateral;   CYSTECTOMY      SOCIAL HISTORY: Social History   Socioeconomic History   Marital status: Single    Spouse name: Not on file   Number of children: 2   Years of education: Not on file   Highest education level: Some college, no degree  Occupational History   Occupation: not working  Tobacco Use   Smoking status: Former    Packs/day: 1.00    Years: 1.00    Pack years: 1.00    Types: Cigarettes    Quit date: 11/22/2017    Years since quitting: 3.2   Smokeless tobacco: Never  Vaping Use   Vaping Use: Never used  Substance and Sexual Activity   Alcohol use: Yes    Comment: once a monthly   Drug use: Yes    Types: Marijuana    Comment: occasionally, once a month   Sexual activity: Yes    Birth control/protection: I.U.D.  Other Topics Concern   Not on file  Social History Narrative   Right handed   Two story    No caffeine, maybe coffee once a week   Social Determinants of Corporate investment banker  Strain: Not on file  Food Insecurity: Not on file  Transportation Needs: Not on file  Physical Activity: Not on file  Stress: Not on file  Social Connections: Not on file  Intimate Partner Violence: Not on file    FAMILY HISTORY: Family History  Problem Relation Age of Onset   Hypertrophic cardiomyopathy Mother    Bradycardia Mother    Healthy Father    Breast cancer Maternal Great-grandmother     ALLERGIES:  is allergic to amoxicillin and penicillins.  MEDICATIONS:  Current Outpatient Medications  Medication Sig Dispense Refill   eszopiclone (LUNESTA) 1 MG TABS tablet Take 1  tablet (1 mg total) by mouth at bedtime as needed for sleep. Take immediately before bedtime 30 tablet 1   famotidine (PEPCID) 20 MG tablet Take 1 tablet (20 mg total) by mouth at bedtime. 30 tablet 1   ferrous sulfate 325 (65 FE) MG EC tablet Take 1 tablet (325 mg total) by mouth daily with breakfast. (Patient not taking: No sig reported) 30 tablet 3   gabapentin (NEURONTIN) 100 MG capsule Take 600 mg by mouth 2 (two) times daily.     glipiZIDE (GLUCOTROL XL) 10 MG 24 hr tablet Take 10 mg by mouth daily.     levonorgestrel (MIRENA) 20 MCG/DAY IUD by Intrauterine route.     nitrofurantoin, macrocrystal-monohydrate, (MACROBID) 100 MG capsule Take 1 capsule (100 mg total) by mouth 2 (two) times daily. (Patient not taking: No sig reported) 10 capsule 0   prednisoLONE acetate (PRED FORTE) 1 % ophthalmic suspension Place 1 drop into the left eye daily. (Patient not taking: Reported on 02/13/2021)     rizatriptan (MAXALT) 10 MG tablet Take 1 tablet earliest onset of migraine.  May repeat in 2 hours if needed.  Maximum 2 tablets in 24 hours 10 tablet 3   rosuvastatin (CRESTOR) 10 MG tablet Take 1 tablet by mouth daily.     topiramate (TOPAMAX) 50 MG tablet Take 25mg  at hs for one week and then 50mg  at thereforth at hs     TRULICITY 1.5 MG/0.5ML SOPN Inject 1.5 mg into the skin once a week.     No current facility-administered medications for this visit.    REVIEW OF SYSTEMS:   Constitutional: ( - ) fevers, ( - )  chills , ( - ) night sweats Eyes: ( - ) blurriness of vision, ( - ) double vision, ( - ) watery eyes Ears, nose, mouth, throat, and face: ( - ) mucositis, ( - ) sore throat Respiratory: ( - ) cough, ( + ) dyspnea, ( - ) wheezes Cardiovascular: ( - ) palpitation, ( - ) chest discomfort, ( - ) lower extremity swelling Gastrointestinal:  ( - ) nausea, ( - ) heartburn, ( - ) change in bowel habits Skin: ( - ) abnormal skin rashes Lymphatics: ( - ) new lymphadenopathy, ( - ) easy  bruising Neurological: ( - ) numbness, ( - ) tingling, ( - ) new weaknesses Behavioral/Psych: ( - ) mood change, ( - ) new changes  All other systems were reviewed with the patient and are negative.  PHYSICAL EXAMINATION: ECOG PERFORMANCE STATUS: 1 - Symptomatic but completely ambulatory  Vitals:   02/13/21 1348  BP: 106/74  Pulse: 82  Resp: 17  Temp: 99.1 F (37.3 C)  SpO2: 94%    Filed Weights   02/13/21 1348  Weight: 260 lb 3.2 oz (118 kg)     GENERAL: well appearing African American female in NAD  SKIN: skin color, texture, turgor are normal, no rashes or significant lesions EYES: conjunctiva are pink and non-injected, sclera clear OROPHARYNX: no exudate, no erythema; lips, buccal mucosa, and tongue normal  NECK: supple, non-tender LYMPH:  no palpable lymphadenopathy in the cervical, axillary or supraclavicular lymph nodes.  LUNGS: clear to auscultation and percussion with normal breathing effort HEART: regular rate & rhythm and no murmurs and no lower extremity edema ABDOMEN: soft, non-tender, non-distended, normal bowel sounds Musculoskeletal: no cyanosis of digits and no clubbing  PSYCH: alert & oriented x 3, fluent speech NEURO: no focal motor/sensory deficits  LABORATORY DATA:  I have reviewed the data as listed CBC Latest Ref Rng & Units 02/13/2021 12/04/2020 08/27/2020  WBC 4.0 - 10.5 K/uL 7.0 6.5 6.2  Hemoglobin 12.0 - 15.0 g/dL 11.5(L) 10.9(L) 11.1(L)  Hematocrit 36.0 - 46.0 % 38.2 37.0 37.7  Platelets 150 - 400 K/uL 367 378 402(H)    CMP Latest Ref Rng & Units 08/04/2019 07/23/2018 07/22/2018  Glucose 65 - 99 mg/dL 403(K) 742(V) 91  BUN 6 - 20 mg/dL 9 - -  Creatinine 9.56 - 1.00 mg/dL 3.87 - -  Sodium 564 - 144 mmol/L 139 - -  Potassium 3.5 - 5.2 mmol/L 4.2 - -  Chloride 96 - 106 mmol/L 107(H) - -  CO2 20 - 29 mmol/L 18(L) - -  Calcium 8.7 - 10.2 mg/dL 9.6 - -    ASSESSMENT & PLAN Christina Morton is a 35 y.o. female presenting to the clinic for iron  deficiency anemia.   # Iron deficiency anemia, etiology unknown: --Received IV feraheme x 2 doses on 01/07/2021 and 01/14/2021.  --Continue to incorporate iron rich foods into diet.  --Patient does not have monthly menstrual cycle since IUD placement. --Scheduled for follow up with [facility] on 03/05/2021. Need to evaluate underlying cause for IDA including malabsorption versus a GI bleed. Patient has not undergoing endoscopic evaluation.  --Labs today show anemia has improved with Hgb of 11.5, MCV 70.0, serum iron 56, iron saturation 23%, ferritin 256. --Currently on ferrous sulfate 325 mg daily. Recommend to continue and sent refill.  --RTC om 3 months with labs.    No orders of the defined types were placed in this encounter.   All questions were answered. The patient knows to call the clinic with any problems, questions or concerns.  I have spent a total of 25 minutes minutes of face-to-face and non-face-to-face time, preparing to see the patient, obtaining and/or reviewing separately obtained history, performing a medically appropriate examination, counseling and educating the patient, documenting clinical information in the electronic health record, and care coordination.    Georga Kaufmann, PA-C Department of Hematology/Oncology [facility] Phone: [phone]

## 2021-02-14 ENCOUNTER — Encounter: Payer: Self-pay | Admitting: Physician Assistant

## 2021-02-14 MED ORDER — FERROUS SULFATE 325 (65 FE) MG PO TBEC: 325.0000 mg | DELAYED_RELEASE_TABLET | Freq: Every day | ORAL | 3 refills | Status: DC

## 2021-02-15 ENCOUNTER — Telehealth: Payer: Self-pay | Admitting: Physician Assistant

## 2021-02-15 NOTE — Telephone Encounter (Signed)
Scheduled per 10/26 los, message was left with pt

## 2021-02-19 ENCOUNTER — Ambulatory Visit: Payer: Medicaid Other | Admitting: Occupational Therapy

## 2021-02-26 ENCOUNTER — Encounter: Payer: Medicaid Other | Admitting: Occupational Therapy

## 2021-02-28 ENCOUNTER — Other Ambulatory Visit: Payer: Self-pay

## 2021-02-28 ENCOUNTER — Ambulatory Visit (INDEPENDENT_AMBULATORY_CARE_PROVIDER_SITE_OTHER): Payer: Medicaid Other | Admitting: Family Medicine

## 2021-02-28 ENCOUNTER — Encounter: Payer: Self-pay | Admitting: Family Medicine

## 2021-02-28 ENCOUNTER — Other Ambulatory Visit (HOSPITAL_COMMUNITY)
Admission: RE | Admit: 2021-02-28 | Discharge: 2021-02-28 | Disposition: A | Discharge status: Home / Self Care | Payer: Medicaid Other | Source: Ambulatory Visit | Attending: Family Medicine | Admitting: Family Medicine

## 2021-02-28 VITALS — BP 109/67 | HR 91 | Wt 260.5 lb

## 2021-02-28 DIAGNOSIS — T8332XA Displacement of intrauterine contraceptive device, initial encounter: Secondary | ICD-10-CM

## 2021-02-28 DIAGNOSIS — Z124 Encounter for screening for malignant neoplasm of cervix: Secondary | ICD-10-CM

## 2021-02-28 DIAGNOSIS — Z30431 Encounter for routine checking of intrauterine contraceptive device: Secondary | ICD-10-CM

## 2021-02-28 DIAGNOSIS — Z01419 Encounter for gynecological examination (general) (routine) without abnormal findings: Secondary | ICD-10-CM | POA: Diagnosis not present

## 2021-02-28 DIAGNOSIS — Z113 Encounter for screening for infections with a predominantly sexual mode of transmission: Secondary | ICD-10-CM

## 2021-02-28 NOTE — Progress Notes (Signed)
GYNECOLOGY OFFICE VISIT NOTE  History:  Christina Morton is a 35 y.o. [id number] here today for annual exam and pap smear. She denies any abnormal vaginal discharge, bleeding, pelvic pain or other concerns.  Has IUD in place. Would like her strings to be checked. Does not get regular periods with her IUD and this is okay for her. She does not check her strings regularly.  Would like screening for STIs today.  Report sweating a lot in her genital area, especially when she is active. She is wondering what is causing this.   She has no other concerns at this time.  Past Medical History:  Diagnosis Date   Anemia    Depression    Glaucoma    History of cesarean section, classical 01/21/2018   Low transverse with 5 cm extension into muscular layer--was told at [facility] risk of rupture--will treat as prior classical   Mental disorder    Type 2 diabetes mellitus without complications (HCC)    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   Pt had laparoscopic curgery for urachal cyst    Past Surgical History:  Procedure Laterality Date   CESAREAN SECTION     CESAREAN SECTION WITH BILATERAL TUBAL LIGATION Bilateral 07/21/2018   Procedure: CESAREAN SECTION WITH BILATERAL TUBAL LIGATION;  Surgeon: Kathrynn Running, MD;  Location: [facility] LD ORS;  Service: Obstetrics;  Laterality: Bilateral;   CYSTECTOMY      The following portions of the patient's history were reviewed and updated as appropriate: allergies, current medications, past family history, past medical history, past social history, past surgical history and problem list.   Health Maintenance:  Last pap 01/07/2018, NILM. Follow up due today. Breast cancer in great grandmother on maternal side at an older age. No other family members with breast cancer. Discuss routine screening due at age 40.   Review of Systems:  Pertinent items noted in HPI and remainder of comprehensive ROS otherwise negative.  Physical Exam:  BP 109/67   Pulse 91   Wt 260 lb 8 oz  (118.2 kg)   BMI 42.05 kg/m   Exam conducted with chaperone present.   CONSTITUTIONAL: Well-developed, well-nourished female in no acute distress.  HEENT:  Normocephalic, atraumatic. EOMI, conjunctivae clear. BREAST: Normal shape and contour, no supraclavicular or axillary LAD, no masses palpated, no nipple discharge, no superficial skin changes present.  CARDIOVASCULAR: Normal heart rate noted. RESPIRATORY: Effort and breath sounds normal, no problems with respiration noted. PELVIC: Normal appearing external genitalia; normal urethral meatus; normal appearing vaginal mucosa and cervix.  No abnormal discharge noted.  Unable to visualize IUD strings.  SKIN: No rashes or lesions noted. NEUROLOGIC: Alert and oriented to person, place, and time. No focal deficit noted.  PSYCHIATRIC: Normal mood and affect. Normal behavior. Normal judgment and thought content.  Assessment and Plan:   1. Cervical cancer screening Pap smear obtained today. Will follow up results. Provided reassurance about genital sweating, especially with activity. Reviewed conservative measures that patient can try at home to help with this including using vaginal cleansing wipes throughout the day and wearing cotton underwear. Patient will try this and reassess. - Cytology - PAP( [facility])  2. Routine screening for STI (sexually transmitted infection) Asymptomatic. Will follow up results and treat as indicated. - HIV antibody (with reflex) - RPR - Hepatitis C Antibody - Cervicovaginal ancillary only( [facility])  3. Intrauterine contraceptive device threads lost, initial encounter Unable to visualize IUD strings on exam today. TVUS ordered to confirm correct placement. Will  follow up results.  - US PELVIC COMPLETE WITH TRANSVAGINAL; Future  Routine preventative health maintenance measures emphasized.  Please refer to After Visit Summary for other counseling recommendations.   Return in about 1 year (around  02/28/2022) for next annual exam, sooner as needed.    Evalina Field, MD OB Fellow, Faculty [facility], [facility] 03/01/2021 3:34 PM

## 2021-03-01 ENCOUNTER — Encounter: Payer: Self-pay | Admitting: Family Medicine

## 2021-03-01 LAB — CERVICOVAGINAL ANCILLARY ONLY
Bacterial Vaginitis (gardnerella): NEGATIVE
Candida Glabrata: NEGATIVE
Candida Vaginitis: NEGATIVE
Chlamydia: NEGATIVE
Comment: NEGATIVE
Comment: NEGATIVE
Comment: NEGATIVE
Comment: NEGATIVE
Comment: NEGATIVE
Comment: NORMAL
Neisseria Gonorrhea: NEGATIVE
Trichomonas: NEGATIVE

## 2021-03-01 LAB — RPR: RPR Ser Ql: NONREACTIVE

## 2021-03-01 LAB — HEPATITIS C ANTIBODY: Hep C Virus Ab: <0.1 s/co ratio (ref 0.0–0.9)

## 2021-03-01 LAB — HIV ANTIBODY (ROUTINE TESTING W REFLEX): HIV Screen 4th Generation wRfx: NONREACTIVE

## 2021-03-04 LAB — CYTOLOGY - PAP
Comment: NEGATIVE
Comment: NEGATIVE
Diagnosis: UNDETERMINED — AB
HPV 16: NEGATIVE
HPV 18 / 45: NEGATIVE
High risk HPV: POSITIVE — AB

## 2021-03-05 ENCOUNTER — Encounter: Payer: Medicaid Other | Admitting: Occupational Therapy

## 2021-03-07 ENCOUNTER — Other Ambulatory Visit: Payer: Self-pay

## 2021-03-07 ENCOUNTER — Ambulatory Visit (HOSPITAL_COMMUNITY)
Admission: RE | Admit: 2021-03-07 | Discharge: 2021-03-07 | Disposition: A | Discharge status: Home / Self Care | Payer: Medicaid Other | Source: Ambulatory Visit | Attending: Family Medicine | Admitting: Family Medicine

## 2021-03-07 DIAGNOSIS — T8332XA Displacement of intrauterine contraceptive device, initial encounter: Secondary | ICD-10-CM | POA: Insufficient documentation

## 2021-03-12 ENCOUNTER — Encounter: Payer: Medicaid Other | Admitting: Occupational Therapy

## 2021-03-19 ENCOUNTER — Encounter (HOSPITAL_BASED_OUTPATIENT_CLINIC_OR_DEPARTMENT_OTHER): Payer: Self-pay

## 2021-03-19 DIAGNOSIS — G4733 Obstructive sleep apnea (adult) (pediatric): Secondary | ICD-10-CM

## 2021-03-19 NOTE — Progress Notes (Deleted)
error 

## 2021-03-25 ENCOUNTER — Encounter: Payer: Self-pay | Admitting: Physician Assistant

## 2021-03-27 ENCOUNTER — Encounter: Payer: Self-pay | Admitting: Physician Assistant

## 2021-03-28 ENCOUNTER — Encounter: Payer: Self-pay | Admitting: Physician Assistant

## 2021-04-01 ENCOUNTER — Ambulatory Visit (HOSPITAL_BASED_OUTPATIENT_CLINIC_OR_DEPARTMENT_OTHER): Payer: Medicaid Other | Admitting: Cardiology

## 2021-04-08 ENCOUNTER — Encounter (INDEPENDENT_AMBULATORY_CARE_PROVIDER_SITE_OTHER): Payer: Self-pay | Admitting: Ophthalmology

## 2021-04-16 ENCOUNTER — Ambulatory Visit (HOSPITAL_BASED_OUTPATIENT_CLINIC_OR_DEPARTMENT_OTHER): Payer: Medicaid Other | Admitting: Internal Medicine

## 2021-04-16 ENCOUNTER — Other Ambulatory Visit: Payer: Self-pay

## 2021-04-17 ENCOUNTER — Encounter (HOSPITAL_BASED_OUTPATIENT_CLINIC_OR_DEPARTMENT_OTHER): Payer: Medicaid Other | Admitting: Internal Medicine

## 2021-04-18 ENCOUNTER — Other Ambulatory Visit (HOSPITAL_COMMUNITY)
Admission: RE | Admit: 2021-04-18 | Discharge: 2021-04-18 | Disposition: A | Discharge status: Home / Self Care | Payer: Medicaid Other | Source: Ambulatory Visit | Attending: Family Medicine | Admitting: Family Medicine

## 2021-04-18 ENCOUNTER — Encounter: Payer: Self-pay | Admitting: Family Medicine

## 2021-04-18 ENCOUNTER — Other Ambulatory Visit: Payer: Self-pay

## 2021-04-18 ENCOUNTER — Ambulatory Visit (INDEPENDENT_AMBULATORY_CARE_PROVIDER_SITE_OTHER): Payer: Medicaid Other | Admitting: Family Medicine

## 2021-04-18 VITALS — BP 109/77 | HR 92 | Wt 267.0 lb

## 2021-04-18 DIAGNOSIS — R87810 Cervical high risk human papillomavirus (HPV) DNA test positive: Secondary | ICD-10-CM

## 2021-04-18 DIAGNOSIS — N870 Mild cervical dysplasia: Secondary | ICD-10-CM

## 2021-04-18 DIAGNOSIS — Z3202 Encounter for pregnancy test, result negative: Secondary | ICD-10-CM | POA: Diagnosis not present

## 2021-04-18 DIAGNOSIS — R87610 Atypical squamous cells of undetermined significance on cytologic smear of cervix (ASC-US): Secondary | ICD-10-CM | POA: Diagnosis present

## 2021-04-18 LAB — POCT PREGNANCY, URINE: Preg Test, Ur: NEGATIVE

## 2021-04-18 NOTE — Progress Notes (Signed)
° ° °  GYNECOLOGY OFFICE COLPOSCOPY PROCEDURE NOTE  35 y.o. [id number] here for colposcopy for ASCUS with POSITIVE high risk HPV pap smear on 02/28/2021. Discussed role for HPV in cervical dysplasia, need for surveillance. Has had Gardasil.  Patient gave informed written consent, time out was performed.  Placed in lithotomy position. Cervix viewed with speculum and colposcope after application of acetic acid.   Colposcopy adequate? Yes  acetowhite lesion(s) noted at anterior portion of SCJ; corresponding biopsies obtained.  ECC specimen obtained. All specimens were labeled and sent to pathology.  Patient was given post procedure instructions.  Will follow up pathology and manage accordingly; patient will be contacted with results and recommendations.  Routine preventative health maintenance measures emphasized.    Reva Bores, MD 04/18/2021 10:07 PM

## 2021-04-23 ENCOUNTER — Encounter (HOSPITAL_BASED_OUTPATIENT_CLINIC_OR_DEPARTMENT_OTHER): Payer: Self-pay

## 2021-04-23 ENCOUNTER — Telehealth: Payer: Self-pay | Admitting: General Practice

## 2021-04-23 ENCOUNTER — Encounter: Payer: Self-pay | Admitting: Family Medicine

## 2021-04-23 DIAGNOSIS — G4733 Obstructive sleep apnea (adult) (pediatric): Secondary | ICD-10-CM

## 2021-04-23 LAB — SURGICAL PATHOLOGY

## 2021-04-23 NOTE — Telephone Encounter (Signed)
Called patient and informed her of colposcopy results as well as follow up recommendations. Patient verbalized understanding & asked if results could be sent to PCP. Told patient yes, I would have our front office staff take care of that. Patient verbalized understanding & had no other questions.

## 2021-04-23 NOTE — Telephone Encounter (Signed)
-----   Message from Reva Bores, MD sent at 04/23/2021 10:34 AM EST ----- Needs recall pap in 1 year

## 2021-04-25 ENCOUNTER — Encounter: Payer: Self-pay | Admitting: Physician Assistant

## 2021-04-26 ENCOUNTER — Encounter: Payer: Self-pay | Admitting: Physician Assistant

## 2021-04-28 IMAGING — MR MR MRV HEAD W/O CM
1 series · 48 of 48 positions shown · IV contrast (agent unspecified)
Comparison: None.

CONTRAST:  20mL MULTIHANCE GADOBENATE DIMEGLUMINE 529 MG/ML IV SOLN

CLINICAL DATA: Increased intracranial pressure. Migraine headache

Creatinine was obtained on site at [HOSPITAL] at [HOSPITAL].
Results: Creatinine 0.9 mg/dL.
EXAM:
MRI HEAD WITHOUT AND WITH CONTRAST
MRV HEAD WITHOUT CONTRAST
TECHNIQUE: Multiplanar, multiecho pulse sequences of the brain and surrounding
structures were obtained without and with intravenous contrast.
Angiographic images of the intracranial venous structures were
obtained using MRV technique without intravenous contrast.

[Series 11: (id) · coronal · 3.0mm · 0.98mm/px · 48 of 107 slices shown]
[im 1/107]
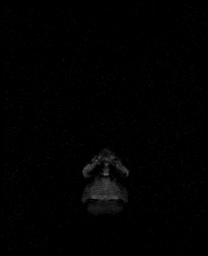
[im 3/107]
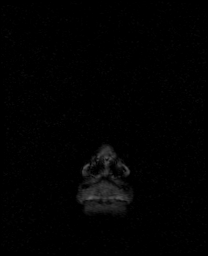
[im 5/107]
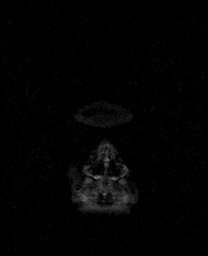
[im 7/107]
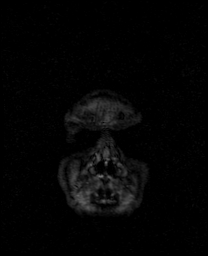
[im 10/107]
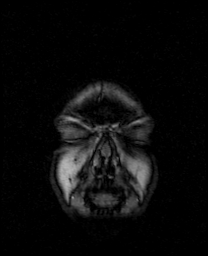
[im 12/107]
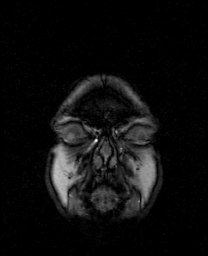
[im 14/107]
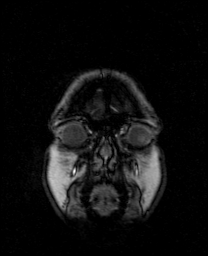
[im 16/107]
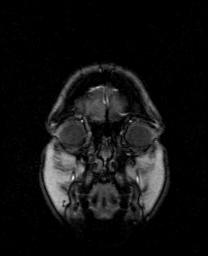
[im 19/107]
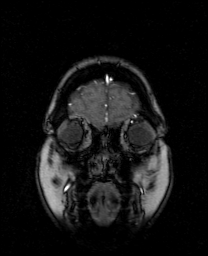
[im 21/107]
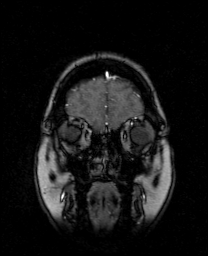
[im 23/107]
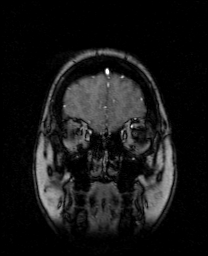
[im 25/107]
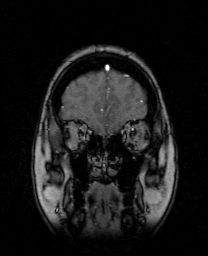
[im 28/107]
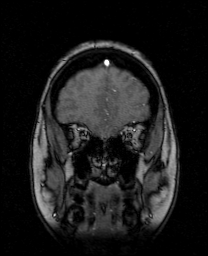
[im 30/107]
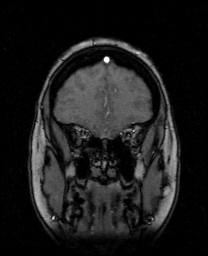
[im 32/107]
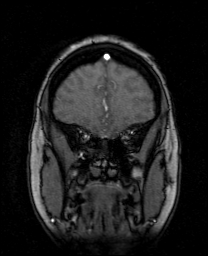
[im 34/107]
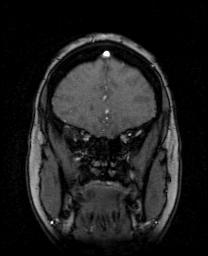
[im 37/107]
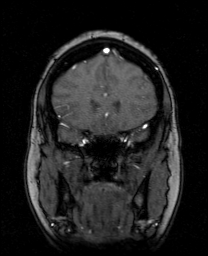
[im 39/107]
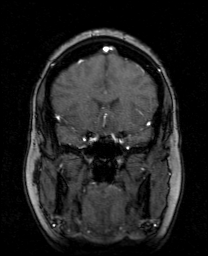
[im 41/107]
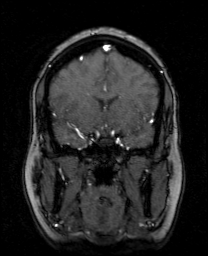
[im 43/107]
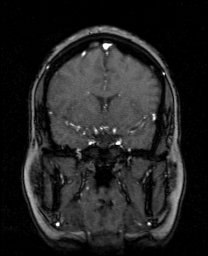
[im 46/107]
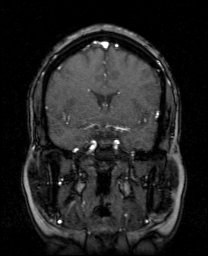
[im 48/107]
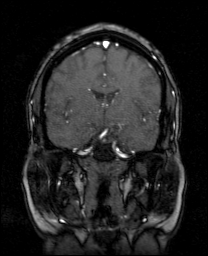
[im 50/107]
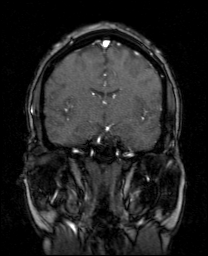
[im 52/107]
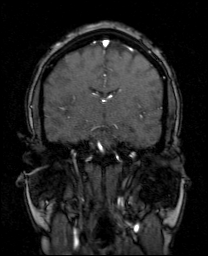
[im 55/107]
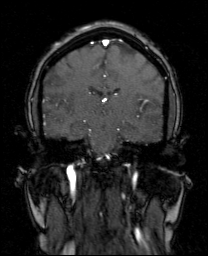
[im 57/107]
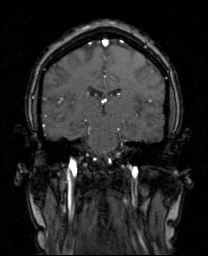
[im 59/107]
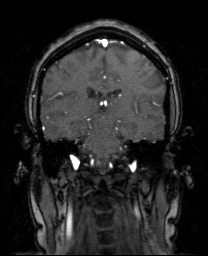
[im 61/107]
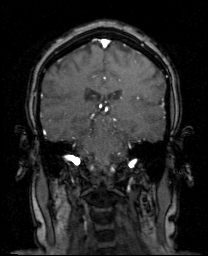
[im 64/107]
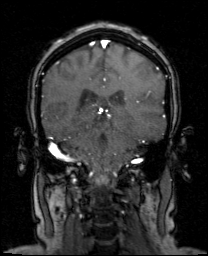
[im 66/107]
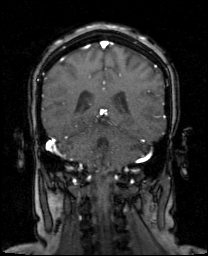
[im 68/107]
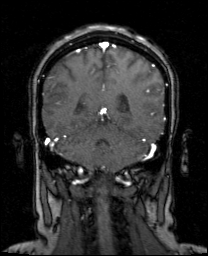
[im 70/107]
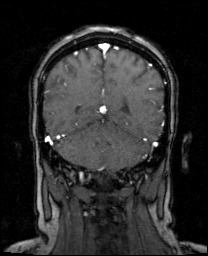
[im 73/107]
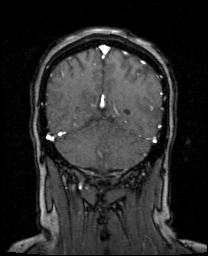
[im 75/107]
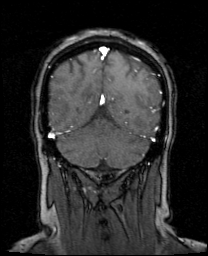
[im 77/107]
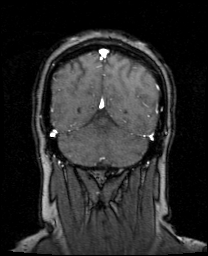
[im 79/107]
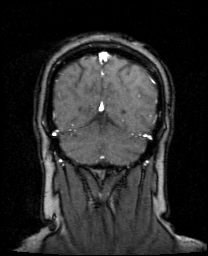
[im 82/107]
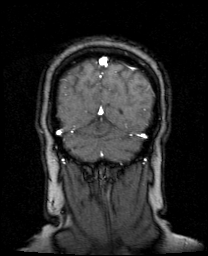
[im 84/107]
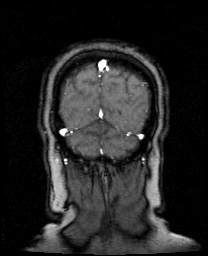
[im 86/107]
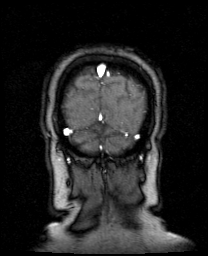
[im 88/107]
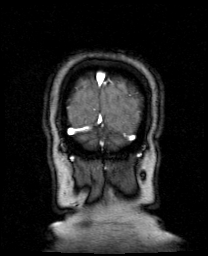
[im 91/107]
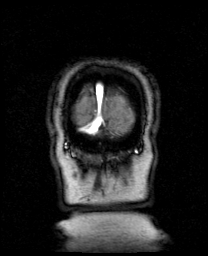
[im 93/107]
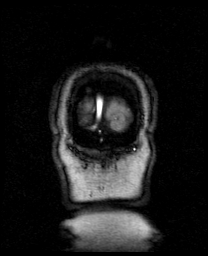
[im 95/107]
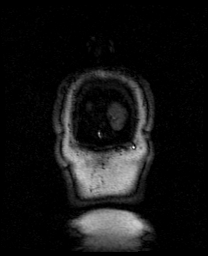
[im 97/107]
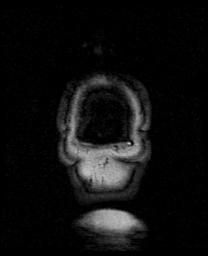
[im 100/107]
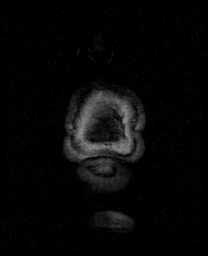
[im 102/107]
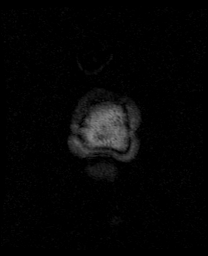
[im 104/107]
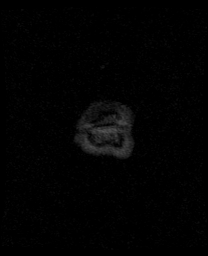
[im 107/107]
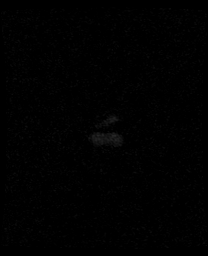

[48 of 48 positions shown; findings below may reference images not displayed]

FINDINGS: MR HEAD FINDINGS

Brain: No acute infarction, hemorrhage, hydrocephalus, extra-axial
collection or mass lesion. Normal white matter

Normal enhancement postcontrast administration.

Vascular: Normal arterial flow voids.

Skull and upper cervical spine: No focal skeletal lesion.

Sinuses/Orbits: Mild mucosal edema paranasal sinuses. Normal orbit

Other: None

MRV HEAD FINDINGS

Negative MRI and MRA head with contrast. Right transverse sinus
dominant widely patent. Loss of flow related signal proximal left
transverse sinus felt to be artifact. This area shows enhancement
postcontrast administration. Sigmoid sinus and jugular vein are
patent bilaterally. Superior sagittal sinus is patent. Straight
sinus is patent.
IMPRESSION: Negative MRI brain with contrast

Negative MR venogram with contrast. Right transverse sinus dominant.

## 2021-05-01 ENCOUNTER — Other Ambulatory Visit: Payer: Self-pay

## 2021-05-01 ENCOUNTER — Encounter (INDEPENDENT_AMBULATORY_CARE_PROVIDER_SITE_OTHER): Payer: Medicaid Other | Admitting: Ophthalmology

## 2021-05-01 DIAGNOSIS — H43813 Vitreous degeneration, bilateral: Secondary | ICD-10-CM

## 2021-05-01 DIAGNOSIS — H33303 Unspecified retinal break, bilateral: Secondary | ICD-10-CM | POA: Diagnosis not present

## 2021-05-03 ENCOUNTER — Ambulatory Visit (HOSPITAL_BASED_OUTPATIENT_CLINIC_OR_DEPARTMENT_OTHER): Payer: Medicaid Other | Admitting: Cardiology

## 2021-05-10 ENCOUNTER — Other Ambulatory Visit: Payer: Self-pay

## 2021-05-10 ENCOUNTER — Encounter (HOSPITAL_BASED_OUTPATIENT_CLINIC_OR_DEPARTMENT_OTHER): Payer: Self-pay | Admitting: Cardiology

## 2021-05-10 ENCOUNTER — Ambulatory Visit (INDEPENDENT_AMBULATORY_CARE_PROVIDER_SITE_OTHER): Payer: Medicaid Other | Admitting: Cardiology

## 2021-05-10 VITALS — BP 114/68 | HR 91 | Ht 68.0 in | Wt 267.0 lb

## 2021-05-10 DIAGNOSIS — E8881 Metabolic syndrome: Secondary | ICD-10-CM

## 2021-05-10 DIAGNOSIS — E1169 Type 2 diabetes mellitus with other specified complication: Secondary | ICD-10-CM

## 2021-05-10 DIAGNOSIS — E669 Obesity, unspecified: Secondary | ICD-10-CM

## 2021-05-10 DIAGNOSIS — Z9189 Other specified personal risk factors, not elsewhere classified: Secondary | ICD-10-CM

## 2021-05-10 DIAGNOSIS — Z7189 Other specified counseling: Secondary | ICD-10-CM

## 2021-05-10 DIAGNOSIS — Z8249 Family history of ischemic heart disease and other diseases of the circulatory system: Secondary | ICD-10-CM

## 2021-05-10 DIAGNOSIS — E785 Hyperlipidemia, unspecified: Secondary | ICD-10-CM

## 2021-05-10 NOTE — Patient Instructions (Addendum)
Medication Instructions:  Your Physician recommend you continue on your current medication as directed.    *If you need a refill on your cardiac medications before your next appointment, please call your pharmacy*   Lab Work: None ordered today   Testing/Procedures: Your physician has requested that you have an echocardiogram. Echocardiography is a painless test that uses sound waves to create images of your heart. It provides your doctor with information about the size and shape of your heart and how well your heart's chambers and valves are working. This procedure takes approximately one hour. There are no restrictions for this procedure. [address]    Follow-Up: At [facility], you and your health needs are our priority.  As part of our continuing mission to provide you with exceptional heart care, we have created designated Provider Care Teams.  These Care Teams include your primary Cardiologist (physician) and Advanced Practice Providers (APPs -  Physician Assistants and Nurse Practitioners) who all work together to provide you with the care you need, when you need it.  We recommend signing up for the patient portal called "MyChart".  Sign up information is provided on this After Visit Summary.  MyChart is used to connect with patients for Virtual Visits (Telemedicine).  Patients are able to view lab/test results, encounter notes, upcoming appointments, etc.  Non-urgent messages can be sent to your provider as well.   To learn more about what you can do with MyChart, go to [email or web address].    Your next appointment:   2 year(s)  The format for your next appointment:   In Person  Provider:   Bridgette Christopher, MD     

## 2021-05-10 NOTE — Progress Notes (Signed)
Cardiology Office Note:    Date:  05/10/2021   ID:  Christina Morton, DOB October 12, 1985, MRN [id number]  PCP:  Audley Hose, MD  Cardiologist:  Buford Dresser, MD  Referring MD: Audley Hose, MD   Chief Complaint  Patient presents with   New Patient (Initial Visit)    History of Present Illness:    Christina Morton is a 36 y.o. female with a hx of type 2 diabetes, anemia, glaucoma, carpal tunnel and depression, who is seen as a new consult at the request of Bakare, Mobolaji B, MD for the evaluation and management of cardiovascular risk indicated by family history of ischemic cardiomyopathy. Her mother is Christina Morton, who is also my patient.  Cardiovascular risk factors: Prior clinical ASCVD: None Comorbid conditions:  Hyperlipidemia - On rosuvastatin since the summer of 2022.  Diabetes - diagnosed 8-9 years ago. While on metformin previously she developed GI side effects, so it was discontinued.  Metabolic syndrome/Obesity: Highest adult weight is her current weight 267 lbs. She notes she has always been 250 lbs or below. Chronic inflammatory conditions: None. A few months ago she had iron infusions due to anemia. Blood platelets typically low. She follows up with oncology.  Tobacco use history: Former smoker. Last quit in 11/2016. Family history: Her mother is also a patient of mine, with a history of hypertrophic cardiomyopathy, pacemaker, hyperlipidemia, hypertension. Her older and younger sisters on her mom's side have bradycardia. Her father has hypertension. Prior cardiac testing and/or incidental findings on other testing (ie coronary calcium): Ultrasound a while ago in [place] (28-29 yo), no significant findings. Exercise level: She states she can be as active as she wants to be. At least 30 minutes of exercise about 3-5 days a week. If she runs, or walks for too long, she will become winded and fatigued easily. Every now and then she experiences dizziness and  lightheadedness, but she denies syncope. Current diet: Enjoys french fries daily. No mashed potatoes, rice rarely. Pasta once a month. She uses olive oil, enjoys pistachios, seafood/fish, broccoli, string beans, collard greens. Often has salads. Occasional corn.  Frequently she feels fatigued, and suffers from daytime somnolence, insomnia, and snoring. She may fall asleep while driving. Next Friday she is scheduled for a sleep study.  Of note, she has migraines at times. They are not as severe as they were previously. At their worst, she requires complete silence and darkness.  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 She reports having a blockage of her right peripheral vision of unknown etiology.   She denies any palpitations, chest pain, orthopnea, PND, or lower extremity edema.  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   Pt had laparoscopic curgery for urachal cyst    Past Surgical History:  Procedure Laterality Date   CESAREAN SECTION     CESAREAN SECTION WITH BILATERAL TUBAL LIGATION Bilateral 07/21/2018   Procedure: CESAREAN SECTION WITH BILATERAL TUBAL LIGATION;  Surgeon: Gwynne Edinger, MD;  Location: [facility] LD ORS;  Service: Obstetrics;  Laterality: Bilateral;   CYSTECTOMY      Current Medications: Current Outpatient Medications on File Prior to Visit  Medication Sig   Cholecalciferol (VITAMIN D3 ADULT GUMMIES) 25 MCG (1000 UT) CHEW 1 tablet   eszopiclone (LUNESTA) 1 MG TABS tablet Take 1 tablet (1 mg total) by mouth at bedtime as needed for sleep. Take immediately before bedtime   famotidine (PEPCID) 20 MG tablet Take 1 tablet (20 mg total) by mouth  at bedtime.   ferrous sulfate 325 (65 FE) MG EC tablet Take 1 tablet (325 mg total) by mouth daily with breakfast.   FLUoxetine (PROZAC) 10 MG capsule 1 capsule   gabapentin (NEURONTIN) 100 MG capsule Take 600 mg by mouth 2 (two) times daily.   gabapentin (NEURONTIN) 300 MG capsule Take 300 mg by mouth 2 (two) times daily.   glipiZIDE (GLUCOTROL XL) 10 MG 24 hr tablet Take 10 mg by mouth daily.   levonorgestrel (MIRENA) 20 MCG/DAY IUD by Intrauterine route.   rizatriptan (MAXALT) 10 MG tablet Take 1 tablet earliest onset of migraine.  May repeat in 2 hours if needed.  Maximum 2 tablets in 24 hours   rosuvastatin (CRESTOR) 10 MG tablet Take 1 tablet by mouth daily.   sertraline (ZOLOFT) 50 MG tablet 1 tablet   topiramate (TOPAMAX) 50 MG tablet Take 25mg  at hs for one week and then 50mg  at thereforth at hs   TRULICITY 1.5 0000000 SOPN Inject 1.5 mg into the skin once a week.   [DISCONTINUED] zolpidem (AMBIEN) 5 MG tablet Take 1 tablet (5 mg total) by mouth at bedtime.   No current facility-administered medications on file prior to visit.     Allergies:   Amoxicillin and Penicillins   Social History   Tobacco Use   Smoking status: Former    Packs/day: 1.00    Years: 1.00    Pack years: 1.00    Types: Cigarettes    Quit date: 11/22/2017    Years since quitting: 3.4   Smokeless tobacco: Never  Vaping Use   Vaping Use: Never used  Substance Use Topics   Alcohol use: Yes    Comment: once a monthly   Drug use: Yes    Types: Marijuana    Comment: occasionally, once a month    Family History: family history includes Bradycardia in her mother; Breast cancer in her maternal great-grandmother; Healthy in her father; Hypertrophic cardiomyopathy in her mother.  ROS:   Please see the history of present illness.  Additional pertinent ROS: Constitutional: Negative for chills, fever, night sweats, unintentional weight loss. Positive for fatigue, daytime somnolence, insomnia, snoring. HENT:  Negative for ear pain and hearing loss.   Eyes: Negative for eye pain. Positive for right peripheral vision loss. Respiratory: Negative for cough, sputum, wheezing. Positive for shortness of breath. Cardiovascular: See HPI. Gastrointestinal: Negative for abdominal pain, melena, and hematochezia.  Genitourinary: Negative for dysuria and hematuria.  Musculoskeletal: Negative for falls and myalgias.  Skin: Negative for itching and rash.  Neurological: Negative for focal weakness, focal sensory changes and loss of consciousness. Positive for dizziness/lightheadedness, migraines. Endo/Heme/Allergies: Does not bruise/bleed easily.    EKGs/Labs/Other Studies Reviewed:    The following studies were reviewed today:  No prior cardiovascular studies available.   EKG:  EKG is personally reviewed.   05/10/2021: NSR at 91 bpm  Recent Labs: 02/13/2021: Hemoglobin 11.5; Platelet Count 367   Recent Lipid Panel    Component Value Date/Time   CHOL 169 08/04/2019 1054   TRIG 73 08/04/2019 1054   HDL 36 (L) 08/04/2019 1054   CHOLHDL 4.7 (H) 08/04/2019 1054   [place]  119 (H) 08/04/2019 1054    Physical Exam:    VS:  BP 114/68 (BP Location: Left Arm, Patient Position: Sitting, Cuff Size: Large)    Pulse 91    Ht 5\' 8"  (1.727 m)    Wt 267 lb (121.1 kg)    BMI 40.60 kg/m     Wt Readings from Last 3 Encounters:  05/10/21 267 lb (121.1 kg)  04/18/21 267 lb (121.1 kg)  04/16/21 250 lb (113.4 kg)    GEN: Well nourished, well developed in no acute distress HEENT: Normal, moist mucous membranes NECK: No JVD CARDIAC: regular rhythm, normal S1 and S2, no rubs or gallops. No murmur. VASCULAR: Radial and DP pulses 2+ bilaterally. No carotid bruits RESPIRATORY:  Clear to auscultation without rales, wheezing or rhonchi  ABDOMEN: Soft, non-tender, non-distended MUSCULOSKELETAL:  Ambulates independently SKIN: Warm and dry, no edema NEUROLOGIC:  Alert and oriented x 3. No focal neuro deficits  noted. PSYCHIATRIC:  Normal affect    ASSESSMENT:    1. Family history of hypertrophic cardiomyopathy   2. Diabetes mellitus type 2 in obese (HCC)   3. Cardiac risk counseling   4. Counseling on health promotion and disease prevention   5. At risk for obstructive sleep apnea   6. Metabolic syndrome   7. Dyslipidemia    PLAN:    Family history of hypertrophic cardiomyopathy -reports remote echo, but no data available -given risk, echo for baseline evaluation and to exclude HCM  Metabolic syndrome: Type II diabetes Obesity, BMI 40, with abdominal adiposity Dyslipidemia -discussed metabolic syndrome. Meets criteria with type II diabetes, abdominal adiposity, and HDL of 36 -she was recently started on rosuvastatin -diabetes managed with trulicity and glipizide. Had GI side effects on metformin -discussed lifestyle changes to help manage  Has upcoming sleep study, concern for sleep apnea  Cardiac risk counseling and prevention recommendations: -recommend heart healthy/Mediterranean diet, with whole grains, fruits, vegetable, fish, lean meats, nuts, and olive oil. Limit salt. -recommend moderate walking, 3-5 times/week for 30-50 minutes each session. Aim for at least 150 minutes.week. Goal should be pace of 3 miles/hours, or walking 1.5 miles in 30 minutes -recommend avoidance of tobacco products. Avoid excess alcohol.  Plan for follow up: 2 years or sooner as needed.  Buford Dresser, MD, PhD, [facility]    Medication Adjustments/Labs and Tests Ordered: Current medicines are reviewed at length with the patient today.  Concerns regarding medicines are outlined above.   Orders Placed This Encounter  Procedures   EKG 12-Lead   ECHOCARDIOGRAM COMPLETE   No orders of the defined types were placed in this encounter.  Patient Instructions  Medication Instructions:  Your Physician recommend you continue on your current medication as directed.     *If you need a refill on your cardiac medications before your next appointment, please call your pharmacy*   Lab Work: None ordered today   Testing/Procedures: Your physician has requested that you have an echocardiogram. Echocardiography is a painless test that uses sound waves to create images of your heart. It provides your doctor with information about the size and shape of your heart and how well your hearts chambers and valves are working. This procedure takes approximately one hour. There are no restrictions for this procedure. [facility], you and your health needs are our priority.  As part of our continuing mission to provide you with exceptional heart care, we have created  designated Provider Care Teams.  These Care Teams include your primary Cardiologist (physician) and Advanced Practice Providers (APPs -  Physician Assistants and Nurse Practitioners) who all work together to provide you with the care you need, when you need it.  We recommend signing up for the patient portal called "MyChart".  Sign up information is provided on this After Visit Summary.  MyChart is used to connect with patients for Virtual Visits (Telemedicine).  Patients are able to view lab/test results, encounter notes, upcoming appointments, etc.  Non-urgent messages can be sent to your provider as well.   To learn more about what you can do with MyChart, go to [email or web address].    Your next appointment:   2 year(s)  The format for your next appointment:   In Person  Provider:   Buford Dresser, MD      [facility] Stumpf,acting as a scribe for Buford Dresser, MD.,have documented all relevant documentation on the behalf of Buford Dresser, MD,as directed by  Buford Dresser, MD while in the presence of Buford Dresser, MD.  I, Buford Dresser, MD, have reviewed all documentation for this visit. The  documentation on 05/10/21 for the exam, diagnosis, procedures, and orders are all accurate and complete.   Signed, Buford Dresser, MD PhD 05/10/2021 6:03 PM    [place]

## 2021-05-17 ENCOUNTER — Ambulatory Visit (HOSPITAL_BASED_OUTPATIENT_CLINIC_OR_DEPARTMENT_OTHER): Payer: Medicaid Other | Attending: Internal Medicine | Admitting: Internal Medicine

## 2021-05-17 ENCOUNTER — Other Ambulatory Visit: Payer: Self-pay

## 2021-05-17 VITALS — Ht 68.0 in | Wt 250.0 lb

## 2021-05-17 DIAGNOSIS — R5383 Other fatigue: Secondary | ICD-10-CM | POA: Insufficient documentation

## 2021-05-17 DIAGNOSIS — R0683 Snoring: Secondary | ICD-10-CM | POA: Diagnosis not present

## 2021-05-17 DIAGNOSIS — E669 Obesity, unspecified: Secondary | ICD-10-CM | POA: Diagnosis not present

## 2021-05-17 DIAGNOSIS — Z6838 Body mass index (BMI) 38.0-38.9, adult: Secondary | ICD-10-CM | POA: Insufficient documentation

## 2021-05-17 DIAGNOSIS — G4733 Obstructive sleep apnea (adult) (pediatric): Secondary | ICD-10-CM | POA: Diagnosis present

## 2021-05-17 DIAGNOSIS — G478 Other sleep disorders: Secondary | ICD-10-CM | POA: Diagnosis present

## 2021-05-20 ENCOUNTER — Other Ambulatory Visit: Payer: Self-pay | Admitting: Physician Assistant

## 2021-05-20 DIAGNOSIS — D508 Other iron deficiency anemias: Secondary | ICD-10-CM

## 2021-05-20 DIAGNOSIS — R718 Other abnormality of red blood cells: Secondary | ICD-10-CM

## 2021-05-21 ENCOUNTER — Ambulatory Visit (HOSPITAL_BASED_OUTPATIENT_CLINIC_OR_DEPARTMENT_OTHER): Payer: Medicaid Other

## 2021-05-21 ENCOUNTER — Inpatient Hospital Stay (HOSPITAL_BASED_OUTPATIENT_CLINIC_OR_DEPARTMENT_OTHER): Payer: Medicaid Other | Admitting: Physician Assistant

## 2021-05-21 ENCOUNTER — Other Ambulatory Visit: Payer: Self-pay

## 2021-05-21 ENCOUNTER — Inpatient Hospital Stay: Payer: Medicaid Other | Attending: Physician Assistant

## 2021-05-21 VITALS — BP 106/72 | HR 85 | Temp 97.9°F | Resp 20 | Wt 265.6 lb

## 2021-05-21 DIAGNOSIS — Z87891 Personal history of nicotine dependence: Secondary | ICD-10-CM | POA: Insufficient documentation

## 2021-05-21 DIAGNOSIS — D508 Other iron deficiency anemias: Secondary | ICD-10-CM | POA: Diagnosis not present

## 2021-05-21 DIAGNOSIS — R5383 Other fatigue: Secondary | ICD-10-CM | POA: Diagnosis not present

## 2021-05-21 DIAGNOSIS — R718 Other abnormality of red blood cells: Secondary | ICD-10-CM | POA: Diagnosis not present

## 2021-05-21 DIAGNOSIS — D509 Iron deficiency anemia, unspecified: Secondary | ICD-10-CM | POA: Diagnosis present

## 2021-05-21 DIAGNOSIS — E119 Type 2 diabetes mellitus without complications: Secondary | ICD-10-CM | POA: Diagnosis not present

## 2021-05-21 LAB — CBC WITH DIFFERENTIAL (CANCER CENTER ONLY)
Abs Immature Granulocytes: 0.02 10*3/uL (ref 0.00–0.07)
Basophils Absolute: 0 10*3/uL (ref 0.0–0.1)
Basophils Relative: 0 %
Eosinophils Absolute: 0.1 10*3/uL (ref 0.0–0.5)
Eosinophils Relative: 2 %
HCT: 40 % (ref 36.0–46.0)
Hemoglobin: 12 g/dL (ref 12.0–15.0)
Immature Granulocytes: 0 %
Lymphocytes Relative: 43 %
Lymphs Abs: 2 10*3/uL (ref 0.7–4.0)
MCH: 21.8 pg — ABNORMAL LOW (ref 26.0–34.0)
MCHC: 30 g/dL (ref 30.0–36.0)
MCV: 72.7 fL — ABNORMAL LOW (ref 80.0–100.0)
Monocytes Absolute: 0.4 10*3/uL (ref 0.1–1.0)
Monocytes Relative: 8 %
Neutro Abs: 2.1 10*3/uL (ref 1.7–7.7)
Neutrophils Relative %: 47 %
Platelet Count: 331 10*3/uL (ref 150–400)
RBC: 5.5 MIL/uL — ABNORMAL HIGH (ref 3.87–5.11)
RDW: 14.7 % (ref 11.5–15.5)
WBC Count: 4.6 10*3/uL (ref 4.0–10.5)
nRBC: 0 % (ref 0.0–0.2)

## 2021-05-21 LAB — IRON AND IRON BINDING CAPACITY (CC-WL,HP ONLY)
Iron: 72 ug/dL (ref 28–170)
Saturation Ratios: 26 % (ref 10.4–31.8)
TIBC: 279 ug/dL (ref 250–450)
UIBC: 207 ug/dL (ref 148–442)

## 2021-05-21 LAB — FERRITIN: Ferritin: 199 ng/mL (ref 11–307)

## 2021-05-21 NOTE — Progress Notes (Signed)
[place] [phone]   [phone]  PROGRESS NOTE  Patient Care Team: Audley Hose, MD as PCP - General (Internal Medicine) Buford Dresser, MD as PCP - Cardiology (Cardiology) [name], DO as Consulting Physician (Neurology)  Hematological/Oncological History 1) 08/10/2020: Labs from [facility] PA-C from [facility] -WBC 6.5, Hgb 10.8 (L), MCV 64.5 (L), Plt 372, Ferritin 26.6, Iron 32 (L), Transferrin 237, Iron Saturation 10% (L), TIBC 332, negative hemoccult test  2) 08/27/2020: Establish care with Dede Query PA-C. Recommended ferrous sulfate 325 mg once daily.   3) Received IV feraheme x 2 doses on 01/07/2021 and 01/14/2021.   CHIEF COMPLAINTS/PURPOSE OF CONSULTATION:  "Iron deficiency anemia "  HISTORY OF PRESENTING ILLNESS:  Christina Morton 36 y.o. female returns for follow-up for iron deficiency anemia.  Patient is unaccompanied for this visit.  Patient continues to have significant fatigue and adds that she cannot drive long distances for fear of falling asleep.  She recently underwent sleep study last week to evaluate for obstructive sleep apnea.  She reports poor sleep habits and generally sleeps a maximum of 4 hours.  Patient denies any nausea, vomiting or abdominal pain.  Her bowel movements are unchanged without diarrhea or constipation. She denies easy bruising or signs of bleeding.  She reports that due to having an IUD, she does not have a menstrual cycle.  Patient denies any fevers, chills, night sweats, dyspnea on exertion, chest pain or cough.  He has no other complaints.  Rest of the 10 point ROS is below.  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    SURGICAL HISTORY: Past Surgical History:  Procedure Laterality Date   CESAREAN SECTION     CESAREAN SECTION WITH BILATERAL TUBAL LIGATION Bilateral 07/21/2018   Procedure: CESAREAN SECTION WITH BILATERAL TUBAL LIGATION;  Surgeon: Gwynne Edinger, MD;  Location: [facility] LD ORS;  Service: Obstetrics;  Laterality: Bilateral;   CYSTECTOMY      SOCIAL HISTORY: Social History   Socioeconomic History   Marital status: Single    Spouse name: Not on file   Number of children: 2   Years of education: Not on file   Highest education level: Some college, no degree  Occupational History   Occupation: not working  Tobacco Use   Smoking status: Former    Packs/day: 1.00    Years: 1.00    Pack years: 1.00    Types: Cigarettes    Quit date: 11/22/2017    Years since quitting: 3.4   Smokeless tobacco: Never  Vaping Use   Vaping Use: Never used  Substance and Sexual Activity   Alcohol use: Yes    Comment: once a monthly   Drug use: Yes    Types: Marijuana    Comment: occasionally, once a month   Sexual activity: Yes    Birth control/protection: I.U.D.  Other Topics Concern   Not on file  Social History Narrative   Right handed   Two story    No caffeine, maybe coffee once a week   Social  Determinants of Health   Financial Resource Strain: Not on file  Food Insecurity: Not on file  Transportation Needs: Not on file  Physical Activity: Not on file  Stress: Not on file  Social Connections: Not on file  Intimate Partner Violence: Not on file    FAMILY HISTORY: Family History  Problem Relation Age of Onset   Hypertrophic cardiomyopathy Mother    Bradycardia Mother    Healthy Father    Breast cancer Maternal Great-grandmother     ALLERGIES:  is allergic to amoxicillin and penicillins.  MEDICATIONS:  Current Outpatient Medications  Medication Sig  Dispense Refill   Cholecalciferol (VITAMIN D3 ADULT GUMMIES) 25 MCG (1000 UT) CHEW 1 tablet     eszopiclone (LUNESTA) 1 MG TABS tablet Take 1 tablet (1 mg total) by mouth at bedtime as needed for sleep. Take immediately before bedtime 30 tablet 1   famotidine (PEPCID) 20 MG tablet Take 1 tablet (20 mg total) by mouth at bedtime. 30 tablet 1   FLUoxetine (PROZAC) 10 MG capsule 1 capsule     gabapentin (NEURONTIN) 100 MG capsule Take 600 mg by mouth 2 (two) times daily.     gabapentin (NEURONTIN) 300 MG capsule Take 300 mg by mouth 2 (two) times daily.     glipiZIDE (GLUCOTROL XL) 10 MG 24 hr tablet Take 10 mg by mouth daily.     levonorgestrel (MIRENA) 20 MCG/DAY IUD by Intrauterine route.     rizatriptan (MAXALT) 10 MG tablet Take 1 tablet earliest onset of migraine.  May repeat in 2 hours if needed.  Maximum 2 tablets in 24 hours 10 tablet 3   rosuvastatin (CRESTOR) 10 MG tablet Take 1 tablet by mouth daily.     sertraline (ZOLOFT) 50 MG tablet 1 tablet     topiramate (TOPAMAX) 50 MG tablet Take 25mg  at hs for one week and then 50mg  at thereforth at hs     TRULICITY 1.5 MG/0.5ML SOPN Inject 1.5 mg into the skin once a week.     ferrous sulfate 325 (65 FE) MG EC tablet Take 1 tablet (325 mg total) by mouth daily with breakfast. (Patient not taking: Reported on 05/21/2021) 30 tablet 3   No current facility-administered medications for this visit.    REVIEW OF SYSTEMS:   Constitutional: ( - ) fevers, ( - )  chills , ( - ) night sweats Eyes: ( - ) blurriness of vision, ( - ) double vision, ( - ) watery eyes Ears, nose, mouth, throat, and face: ( - ) mucositis, ( - ) sore throat Respiratory: ( - ) cough, ( - ) dyspnea, ( - ) wheezes Cardiovascular: ( - ) palpitation, ( - ) chest discomfort, ( - ) lower extremity swelling Gastrointestinal:  ( - ) nausea, ( - ) heartburn, ( - ) change in bowel habits Skin: ( - ) abnormal skin rashes Lymphatics: ( - ) new lymphadenopathy, ( - ) easy  bruising Neurological: ( - ) numbness, ( - ) tingling, ( - ) new weaknesses Behavioral/Psych: ( - ) mood change, ( - ) new changes  All other systems were reviewed with the patient and are negative.  PHYSICAL EXAMINATION: ECOG PERFORMANCE STATUS: 1 - Symptomatic but completely ambulatory  Vitals:   05/21/21 1030  BP: 106/72  Pulse: 85  Resp: 20  Temp: 97.9 F (36.6 C)  SpO2: 100%    Filed Weights   05/21/21 1030  Weight: 265 lb 9.6 oz (120.5 kg)  GENERAL: well appearing African American female in NAD  SKIN: skin color, texture, turgor are normal, no rashes or significant lesions EYES: conjunctiva are pink and non-injected, sclera clear OROPHARYNX: no exudate, no erythema; lips, buccal mucosa, and tongue normal  NECK: supple, non-tender LYMPH:  no palpable lymphadenopathy in the cervical or supraclavicular lymph nodes.  LUNGS: clear to auscultation and percussion with normal breathing effort HEART: regular rate & rhythm and no murmurs and no lower extremity edema ABDOMEN: soft, non-tender, non-distended, normal bowel sounds Musculoskeletal: no cyanosis of digits and no clubbing  PSYCH: alert & oriented x 3, fluent speech NEURO: no focal motor/sensory deficits  LABORATORY DATA:  I have reviewed the data as listed CBC Latest Ref Rng & Units 05/21/2021 02/13/2021 12/04/2020  WBC 4.0 - 10.5 K/uL 4.6 7.0 6.5  Hemoglobin 12.0 - 15.0 g/dL 12.0 11.5(L) 10.9(L)  Hematocrit 36.0 - 46.0 % 40.0 38.2 37.0  Platelets 150 - 400 K/uL 331 367 378    CMP Latest Ref Rng & Units 08/04/2019 07/23/2018 07/22/2018  Glucose 65 - 99 mg/dL 108(H) 110(H) 91  BUN 6 - 20 mg/dL 9 - -  Creatinine 0.57 - 1.00 mg/dL 0.89 - -  Sodium 134 - 144 mmol/L 139 - -  Potassium 3.5 - 5.2 mmol/L 4.2 - -  Chloride 96 - 106 mmol/L 107(H) - -  CO2 20 - 29 mmol/L 18(L) - -  Calcium 8.7 - 10.2 mg/dL 9.6 - -    ASSESSMENT & PLAN Rodessa Engberg is a 36 y.o. female presenting to the clinic for iron deficiency  anemia.   # Iron deficiency anemia, etiology unknown: --Received IV feraheme x 2 doses on 01/07/2021 and 01/14/2021.  --Continue to incorporate iron rich foods into diet.  --Patient does not have monthly menstrual cycle since IUD placement. --Under the care of Bayley McMichael PA-C from [facility]. Records indicate patient had negative hemoccult and did not appear to have GI etiology. Patient was to undergo additional testing for celiac disease, results not available.  --Labs today show anemia has resolved with Hgb 12.0. Iron panel shows no evidence of deficiency with serum iron 72, TIBC 279, saturation 26%, ferritin 199.  --Okay to hold iron tablets and do not need IV iron infusions.  --RTC in 6 months with repeat labs.   #Microcytosis: --Hemoglobin electrophoresis from 08/27/2020 ruled out hemoglobinopathies except for alpha thalassemia.  --Due to persistent microcytosis without iron deficiency, we will further evaluate and check for alpha thalassemia.   #Fatigue: --Uncertain etiology as symptoms have persistent even though iron deficiency anemia has resolved.  --Patient is awaiting sleep study results to determine if she has obstructive sleep apnea --Encouraged patient to follow up with PCP to further evaluate symptoms.   No orders of the defined types were placed in this encounter.   All questions were answered. The patient knows to call the clinic with any problems, questions or concerns.  I have spent a total of 25 minutes minutes of face-to-face and non-face-to-face time, preparing to see the patient, obtaining and/or reviewing separately obtained history, performing a medically appropriate examination, counseling and educating the patient, documenting clinical information in the electronic health record, and care coordination.    Dede Query, PA-C Department of Hematology/Oncology [place] [facility] Phone: [phone]

## 2021-05-22 ENCOUNTER — Telehealth: Payer: Self-pay | Admitting: Physician Assistant

## 2021-05-22 NOTE — Telephone Encounter (Signed)
Scheduled per 1/31 los, message has been left with pt °

## 2021-05-23 ENCOUNTER — Ambulatory Visit (INDEPENDENT_AMBULATORY_CARE_PROVIDER_SITE_OTHER): Payer: Medicaid Other

## 2021-05-23 ENCOUNTER — Other Ambulatory Visit: Payer: Self-pay

## 2021-05-23 DIAGNOSIS — Z8249 Family history of ischemic heart disease and other diseases of the circulatory system: Secondary | ICD-10-CM

## 2021-05-23 DIAGNOSIS — R0609 Other forms of dyspnea: Secondary | ICD-10-CM

## 2021-05-23 LAB — ECHOCARDIOGRAM COMPLETE
AR max vel: 2.59 cm2
AV Area VTI: 2.7 cm2
AV Area mean vel: 2.8 cm2
AV Mean grad: 2 mmHg
AV Peak grad: 4.8 mmHg
Ao pk vel: 1.09 m/s
Area-P 1/2: 3.46 cm2
Calc EF: 62.5 %
S' Lateral: 2.73 cm
Single Plane A2C EF: 64.8 %
Single Plane A4C EF: 61.3 %

## 2021-05-25 DIAGNOSIS — G4733 Obstructive sleep apnea (adult) (pediatric): Secondary | ICD-10-CM | POA: Diagnosis not present

## 2021-05-25 NOTE — Procedures (Signed)
° ° °  Patient Name: Christina Morton, Christina Morton Date: 05/17/2021 Gender: Female D.O.B: 1985/11/18 Age (years): 35 Referring Provider: Jamison Oka MD Height (inches): 68 Interpreting Physician: Jetty Duhamel MD, ABSM Weight (lbs): 250 RPSGT: Cherylann Parr BMI: 38 MRN: [id number] Neck Size: 14.50  CLINICAL INFORMATION Sleep Study Type: NPSG Indication for sleep study: Fatigue, Obesity, Snoring, Witnesses Apnea / Gasping During Sleep Epworth Sleepiness Score: 21  SLEEP STUDY TECHNIQUE As per the AASM Manual for the Scoring of Sleep and Associated Events v2.3 (April 2016) with a hypopnea requiring 4% desaturations.  The channels recorded and monitored were frontal, central and occipital EEG, electrooculogram (EOG), submentalis EMG (chin), nasal and oral airflow, thoracic and abdominal wall motion, anterior tibialis EMG, snore microphone, electrocardiogram, and pulse oximetry.  MEDICATIONS Medications self-administered by patient taken the night of the study : none reported  SLEEP ARCHITECTURE The study was initiated at 10:51:30 PM and ended at 4:59:43 AM.  Sleep onset time was 63.2 minutes and the sleep efficiency was 80.3%%. The total sleep time was 295.5 minutes.  Stage REM latency was 41.0 minutes.  The patient spent 2.2%% of the night in stage N1 sleep, 70.9%% in stage N2 sleep, 0.0%% in stage N3 and 26.9% in REM.  Alpha intrusion was absent.  Supine sleep was 0.00%.  RESPIRATORY PARAMETERS The overall apnea/hypopnea index (AHI) was 0.6 per hour. There were 0 total apneas, including 0 obstructive, 0 central and 0 mixed apneas. There were 3 hypopneas and 0 RERAs.  The AHI during Stage REM sleep was 2.3 per hour.  AHI while supine was N/A per hour.  The mean oxygen saturation was 97.1%. The minimum SpO2 during sleep was 92.0%.  moderate snoring was noted during this study.  CARDIAC DATA The 2 lead EKG demonstrated sinus rhythm. The mean heart rate was 73.5 beats per  minute. Other EKG findings include: None.  LEG MOVEMENT DATA The total PLMS were 0 with a resulting PLMS index of 0.0. Associated arousal with leg movement index was 0.0 .  IMPRESSIONS - No significant obstructive sleep apnea occurred during this study (AHI = 0.6/h). - The patient had minimal or no oxygen desaturation during the study (Min O2 = 92.0%) - The patient snored with moderate snoring volume. - No cardiac abnormalities were noted during this study. - Clinically significant periodic limb movements did not occur during sleep. No significant associated arousals.  DIAGNOSIS - Primary Snoring  RECOMMENDATIONS - Manage for snoring and symptoms based on clinical judgment - Sleep hygiene should be reviewed to assess factors that may improve sleep quality. - Weight management and regular exercise should be initiated or continued if appropriate.  [Electronically signed] 05/25/2021 11:53 AM  Jetty Duhamel MD, ABSM Diplomate, American Board of Sleep Medicine   NPI: [id number]                         Jetty Duhamel Diplomate, American Board of Sleep Medicine  ELECTRONICALLY SIGNED ON:  05/25/2021, 11:51 AM [facility] PH: [phone]   FX: [phone] ACCREDITED BY THE AMERICAN ACADEMY OF SLEEP MEDICINE

## 2021-05-28 LAB — ALPHA-THALASSEMIA GENOTYPR

## 2021-05-29 ENCOUNTER — Telehealth: Payer: Self-pay | Admitting: Physician Assistant

## 2021-05-30 ENCOUNTER — Encounter: Payer: Self-pay | Admitting: Physician Assistant

## 2021-05-30 NOTE — Telephone Encounter (Signed)
I called Ms. Ill to review the lab results from 05/21/2021. Findings confirm that anemia has resolved with Hgb within normal limits. The iron panel shows no evidence of iron deficiency. Due to persistent microcytosis, I ordered alpha thalassemia DNA analysis. Findings confirmed alpha-thalassemia minor which explains the microcytosis. No further intervention is required. We will see patient back in clinic in 6 months as previously discussed.   Patient expressed understanding of the plan provided.

## 2021-06-05 ENCOUNTER — Other Ambulatory Visit: Payer: Self-pay | Admitting: Internal Medicine

## 2021-06-05 DIAGNOSIS — E049 Nontoxic goiter, unspecified: Secondary | ICD-10-CM

## 2021-06-09 ENCOUNTER — Telehealth: Payer: Medicaid Other | Admitting: Family

## 2021-06-09 DIAGNOSIS — R399 Unspecified symptoms and signs involving the genitourinary system: Secondary | ICD-10-CM | POA: Diagnosis not present

## 2021-06-09 DIAGNOSIS — J069 Acute upper respiratory infection, unspecified: Secondary | ICD-10-CM

## 2021-06-09 MED ORDER — SULFAMETHOXAZOLE-TRIMETHOPRIM 800-160 MG PO TABS: 1.0000 | ORAL_TABLET | Freq: Two times a day (BID) | ORAL | 0 refills | Status: DC

## 2021-06-09 MED ORDER — FLUCONAZOLE 150 MG PO TABS: 150.0000 mg | ORAL_TABLET | ORAL | 0 refills | Status: DC | PRN

## 2021-06-09 MED ORDER — BENZONATATE 100 MG PO CAPS: 100.0000 mg | ORAL_CAPSULE | Freq: Three times a day (TID) | ORAL | 0 refills | Status: DC | PRN

## 2021-06-09 NOTE — Progress Notes (Signed)
Virtual Visit Consent   Christina Morton, you are scheduled for a virtual visit with a [facility] provider today.     Just as with appointments in the office, your consent must be obtained to participate.  Your consent will be active for this visit and any virtual visit you may have with one of our providers in the next 365 days.     If you have a MyChart account, a copy of this consent can be sent to you electronically.  All virtual visits are billed to your insurance company just like a traditional visit in the office.    As this is a virtual visit, video technology does not allow for your provider to perform a traditional examination.  This may limit your provider's ability to fully assess your condition.  If your provider identifies any concerns that need to be evaluated in person or the need to arrange testing (such as labs, EKG, etc.), we will make arrangements to do so.     Although advances in technology are sophisticated, we cannot ensure that it will always work on either your end or our end.  If the connection with a video visit is poor, the visit may have to be switched to a telephone visit.  With either a video or telephone visit, we are not always able to ensure that we have a secure connection.     I need to obtain your verbal consent now.   Are you willing to proceed with your visit today?    Christina Morton has provided verbal consent on 06/09/2021 for a virtual visit (video or telephone).   Evelina Dun, FNP   Date: 06/09/2021 1:49 PM   Virtual Visit via Video Note   I, Evelina Dun, connected with  Christina Morton  ([id number], [birth year]) on 06/09/21 at  1:45 PM EST by a video-enabled telemedicine application and verified that I am speaking with the correct person using two identifiers.  Location: Patient: Virtual Visit Location Patient: Home Provider: Virtual Visit Location Provider: Home Office   I discussed the limitations of evaluation and management by telemedicine and  the availability of in person appointments. The patient expressed understanding and agreed to proceed.    History of Present Illness: Christina Morton is a 36 y.o. who identifies as a female who was assigned female at birth, and is being seen today for UTI symptoms that started a few days ago.  She reports she also started having coughing, congestion that started today. She took a home COVID test that was negative. She has been taking taking OTC medications with mild relief.   HPI: Dysuria  This is a new problem. The current episode started in the past 7 days. The problem occurs intermittently. The problem has been waxing and waning. The quality of the pain is described as burning. The pain is at a severity of 1/10. The pain is mild. Associated symptoms include frequency, hesitancy and urgency. Pertinent negatives include no hematuria, nausea or vomiting. She has tried increased fluids for the symptoms. The treatment provided mild relief.   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 Yes Did it involve sudden or severe rash/hives, skin peeling, or any reaction on the inside of your mouth or nose? No Did you need to seek medical attention at a hospital or doctor's office? Yes When did it last happen?      childhood allergy If all above answers are "NO", may proceed with cephalosporin use.   Penicillins Anaphylaxis    Did it involve swelling of  the face/tongue/throat, SOB, or low BP? Yes Did it involve sudden or severe rash/hives, skin peeling, or any reaction on the inside of your mouth or nose? No Did you need to seek medical attention at a hospital or doctor's office? Yes When did it last happen?      childhood allergy If all above answers are NO, may proceed with cephalosporin use.    Medications:  Current Outpatient Medications:    benzonatate (TESSALON PERLES) 100 MG capsule, Take 1 capsule (100 mg total) by mouth 3 (three) times daily as needed., Disp: 20 capsule, Rfl: 0   fluconazole (DIFLUCAN) 150 MG tablet, Take 1 tablet (150 mg total) by mouth every three (3) days as needed., Disp: 2 tablet, Rfl: 0   sulfamethoxazole-trimethoprim (BACTRIM DS) 800-160 MG tablet, Take 1 tablet by mouth 2 (two) times daily., Disp: 14 tablet, Rfl: 0   Cholecalciferol (VITAMIN D3 ADULT GUMMIES) 25 MCG (1000 UT) CHEW, 1 tablet, Disp: , Rfl:    eszopiclone (LUNESTA) 1 MG TABS tablet, Take 1 tablet (1 mg total) by mouth at bedtime as needed for sleep. Take immediately before bedtime, Disp: 30 tablet, Rfl: 1   famotidine (PEPCID) 20 MG tablet, Take 1 tablet (20 mg total) by mouth at bedtime., Disp: 30 tablet, Rfl: 1   FLUoxetine (PROZAC) 10 MG capsule, 1 capsule, Disp: , Rfl:    gabapentin (NEURONTIN) 100 MG capsule, Take 600 mg by mouth 2 (two) times daily., Disp: , Rfl:    gabapentin (NEURONTIN) 300 MG capsule, Take 300 mg by mouth 2 (two) times daily., Disp: , Rfl:    glipiZIDE (GLUCOTROL XL) 10 MG 24 hr tablet, Take 10 mg by mouth daily., Disp: , Rfl:    levonorgestrel (MIRENA) 20 MCG/DAY IUD, by Intrauterine route., Disp: , Rfl:    rizatriptan (MAXALT) 10 MG tablet, Take 1 tablet earliest onset of migraine.  May repeat in 2 hours if needed.  Maximum 2 tablets in 24 hours, Disp: 10 tablet, Rfl: 3   rosuvastatin (CRESTOR) 10 MG tablet, Take 1 tablet by mouth daily., Disp: , Rfl:    sertraline (ZOLOFT) 50 MG tablet, 1 tablet, Disp: , Rfl:     topiramate (TOPAMAX) 50 MG tablet, Take 25mg  at hs for one week and then 50mg  at thereforth at hs, Disp: , Rfl:    TRULICITY 1.5 0000000 SOPN, Inject 1.5 mg into the skin once a week., Disp: , Rfl:   Observations/Objective: Patient is well-developed, well-nourished in no acute distress.  Resting comfortably  at home.  Head is normocephalic, atraumatic.  No labored breathing.  Speech is clear and coherent with logical content.  Patient is alert and oriented at baseline.    Assessment and Plan: 1. UTI symptoms - sulfamethoxazole-trimethoprim (BACTRIM DS) 800-160 MG tablet; Take 1 tablet by mouth 2 (two) times daily.  Dispense: 14 tablet; Refill: 0  2. Viral URI - benzonatate (TESSALON PERLES) 100 MG capsule; Take 1 capsule (100  mg total) by mouth 3 (three) times daily as needed.  Dispense: 20 capsule; Refill: 0  Force fluids AZO over the counter X2 days Diflucan rx sent to pharmacy in case of yeast infection  - Take meds as prescribed - Use a cool mist humidifier  -Use saline nose sprays frequently -Force fluids -For any cough or congestion  Use plain Mucinex- regular strength or max strength is fine -For fever or aces or pains- take tylenol or ibuprofen. -Throat lozenges if help  Follow Up Instructions: I discussed the assessment and treatment plan with the patient. The patient was provided an opportunity to ask questions and all were answered. The patient agreed with the plan and demonstrated an understanding of the instructions.  A copy of instructions were sent to the patient via MyChart unless otherwise noted below.    The patient was advised to call back or seek an in-person evaluation if the symptoms worsen or if the condition fails to improve as anticipated.  Time:  I spent 12 minutes with the patient via telehealth technology discussing the above problems/concerns.    Evelina Dun, FNP

## 2021-06-11 ENCOUNTER — Ambulatory Visit (HOSPITAL_COMMUNITY)
Admission: EM | Admit: 2021-06-11 | Discharge: 2021-06-11 | Disposition: A | Discharge status: Home / Self Care | Payer: Medicaid Other | Attending: Student | Admitting: Student

## 2021-06-11 ENCOUNTER — Encounter (HOSPITAL_COMMUNITY): Payer: Self-pay | Admitting: Emergency Medicine

## 2021-06-11 ENCOUNTER — Other Ambulatory Visit: Payer: Self-pay

## 2021-06-11 DIAGNOSIS — N3090 Cystitis, unspecified without hematuria: Secondary | ICD-10-CM | POA: Insufficient documentation

## 2021-06-11 DIAGNOSIS — N3000 Acute cystitis without hematuria: Secondary | ICD-10-CM | POA: Diagnosis not present

## 2021-06-11 LAB — POCT URINALYSIS DIPSTICK, ED / UC
Glucose, UA: 250 mg/dL — AB
Hgb urine dipstick: NEGATIVE
Ketones, ur: 15 mg/dL — AB
Nitrite: POSITIVE — AB
Protein, ur: 100 mg/dL — AB
Specific Gravity, Urine: 1.02 (ref 1.005–1.030)
Urobilinogen, UA: >=8 mg/dL (ref 0.0–1.0)
pH: 5 (ref 5.0–8.0)

## 2021-06-11 LAB — POC URINE PREG, ED: Preg Test, Ur: NEGATIVE

## 2021-06-11 MED ORDER — NITROFURANTOIN MONOHYD MACRO 100 MG PO CAPS: 100.0000 mg | ORAL_CAPSULE | Freq: Two times a day (BID) | ORAL | 0 refills | Status: DC

## 2021-06-11 MED ORDER — FLUCONAZOLE 150 MG PO TABS: 150.0000 mg | ORAL_TABLET | Freq: Every day | ORAL | 0 refills | Status: DC

## 2021-06-11 NOTE — Discharge Instructions (Addendum)
-  Stop bactrim and start macrobid twice daily x5 days -For your yeast infection, start the Diflucan (fluconazole)- Take one pill today (day 1). If you're still having symptoms in 5 days, take the second pill.  -Drink plenty of fluids

## 2021-06-11 NOTE — ED Provider Notes (Signed)
[place]    CSN: [id number] Arrival date & time: 06/11/21  1925      History   Chief Complaint Chief Complaint  Patient presents with   Urinary Tract Infection    HPI Christina Morton is a 36 y.o. female presenting with recurrent cystitis.  Medical history recurrent cystitis.  Describes 5 days of dysuria and frequency.  Televisit with PCP and was prescribed Bactrim, symptoms improved for 2 days but then returned.  Continued urgency, frequency, dysuria.  Denies abdominal pain, flank pain, fever/chills, vaginal symptoms.  IUD contraception.  States she is concerned the Bactrim is not working, and is requesting Paradis.  Did take Azo today.  Last UTI before this was 8/22.  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    Patient Active Problem List   Diagnosis Date Noted   Microcytosis 05/21/2021   Iron deficiency anemia 08/27/2020   Flu-like symptoms 04/06/2020   Healthcare maintenance 11/26/2019   Insomnia 01/23/2019   Cervical radiculopathy 12/08/2018   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   Depression 03/23/2018   Glaucoma 03/23/2018   Family history of hypertrophic cardiomyopathy 01/21/2018   Diabetes mellitus type 2 in obese ([place]) 01/21/2018    Past Surgical History:  Procedure Laterality Date   CESAREAN SECTION     CESAREAN SECTION WITH BILATERAL TUBAL LIGATION Bilateral 07/21/2018   Procedure: CESAREAN SECTION WITH BILATERAL TUBAL LIGATION;  Surgeon: Gwynne Edinger, MD;  Location: [facility] LD ORS;  Service: Obstetrics;  Laterality: Bilateral;   CYSTECTOMY      OB  History     Gravida  2   Para  2   Term  2   Preterm  0   AB  0   Living  2      SAB  0   IAB  0   Ectopic  0   Multiple  0   Live Births  2            Home Medications    Prior to Admission medications   Medication Sig Start Date End Date Taking? Authorizing Provider  fluconazole (DIFLUCAN) 150 MG tablet Take 1 tablet (150 mg total) by mouth daily. -For your yeast infection, start the Diflucan (fluconazole)- Take one pill today (day 1). If you're still having symptoms in 3 days, take the second pill. 06/11/21  Yes Hazel Sams, PA-C  nitrofurantoin, macrocrystal-monohydrate, (MACROBID) 100 MG capsule Take 1 capsule (100 mg total) by mouth 2 (two) times daily. 06/11/21  Yes Hazel Sams, PA-C  benzonatate (TESSALON PERLES) 100 MG capsule Take 1 capsule (100 mg total) by mouth 3 (three) times daily as needed. Patient not taking: Reported on 06/11/2021 06/09/21   Evelina Dun A, FNP  Cholecalciferol (VITAMIN D3 ADULT GUMMIES) 25 MCG (1000 UT) CHEW 1 tablet    [provider]  eszopiclone (LUNESTA) 1 MG TABS tablet Take 1 tablet (1 mg total) by mouth at bedtime as needed for sleep. Take immediately before bedtime 08/09/19   Shirley, [place], DO  famotidine (PEPCID) 20 MG tablet Take 1 tablet (  20 mg total) by mouth at bedtime. 12/08/18   Shirley, [place], DO  FLUoxetine (PROZAC) 10 MG capsule 1 capsule    [provider]  gabapentin (NEURONTIN) 100 MG capsule Take 600 mg by mouth 2 (two) times daily. 11/21/20   [provider]  gabapentin (NEURONTIN) 300 MG capsule Take 300 mg by mouth 2 (two) times daily. 04/03/21   [provider]  glipiZIDE (GLUCOTROL XL) 10 MG 24 hr tablet Take 10 mg by mouth daily. 08/13/20   [provider]  levonorgestrel (MIRENA) 20 MCG/DAY IUD by Intrauterine route.    [provider]  rizatriptan (MAXALT) 10 MG tablet Take 1 tablet earliest onset of migraine.  May repeat in 2 hours if needed.   Maximum 2 tablets in 24 hours 04/08/19   Metta Clines R, DO  rosuvastatin (CRESTOR) 10 MG tablet Take 1 tablet by mouth daily. 11/21/20   [provider]  sertraline (ZOLOFT) 50 MG tablet 1 tablet    [provider]  sulfamethoxazole-trimethoprim (BACTRIM DS) 800-160 MG tablet Take 1 tablet by mouth 2 (two) times daily. 06/09/21   Sharion Balloon, FNP  topiramate (TOPAMAX) 50 MG tablet Take 25mg  at hs for one week and then 50mg  at thereforth at hs 08/11/19   Enid Derry, [place], DO  TRULICITY 1.5 0000000 SOPN Inject 1.5 mg into the skin once a week. 11/29/20   [provider]  zolpidem (AMBIEN) 5 MG tablet Take 1 tablet (5 mg total) by mouth at bedtime. 05/04/19 07/09/19  Dickie La, MD    Family History Family History  Problem Relation Age of Onset   Hypertrophic cardiomyopathy Mother    Bradycardia Mother    Healthy Father    Breast cancer Maternal Great-grandmother     Social History Social History   Tobacco Use   Smoking status: Former    Packs/day: 1.00    Years: 1.00    Pack years: 1.00    Types: Cigarettes    Quit date: 11/22/2017    Years since quitting: 3.5   Smokeless tobacco: Never  Vaping Use   Vaping Use: Never used  Substance Use Topics   Alcohol use: Yes    Comment: once a monthly   Drug use: Yes    Types: Marijuana    Comment: occasionally, once a month     Allergies   Amoxicillin and Penicillins   Review of Systems Review of Systems  Constitutional:  Negative for appetite change, chills, diaphoresis and fever.  Respiratory:  Negative for shortness of breath.   Cardiovascular:  Negative for chest pain.  Gastrointestinal:  Negative for abdominal pain, blood in stool, constipation, diarrhea, nausea and vomiting.  Genitourinary:  Positive for dysuria, frequency and urgency. Negative for decreased urine volume, difficulty urinating, flank pain, genital sores and hematuria.  Musculoskeletal:  Negative for back pain.  Neurological:   Negative for dizziness, weakness and light-headedness.  All other systems reviewed and are negative.   Physical Exam Triage Vital Signs ED Triage Vitals  Enc Vitals Group     BP 06/11/21 1940 114/80     Pulse Rate 06/11/21 1940 89     Resp 06/11/21 1940 20     Temp 06/11/21 1940 98.3 F (36.8 C)     Temp Source 06/11/21 1940 Oral     SpO2 06/11/21 1940 96 %     Weight --      Height --      Head Circumference --  Peak Flow --      Pain Score 06/11/21 1937 0     Pain Loc --      Pain Edu? --      Excl. in [place]? --    No data found.  Updated Vital Signs BP 114/80 (BP Location: Right Arm) Comment (BP Location): large cuff   Pulse 89    Temp 98.3 F (36.8 C) (Oral)    Resp 20    SpO2 96%   Visual Acuity Right Eye Distance:   Left Eye Distance:   Bilateral Distance:    Right Eye Near:   Left Eye Near:    Bilateral Near:     Physical Exam Vitals reviewed.  Constitutional:      General: She is not in acute distress.    Appearance: Normal appearance. She is not ill-appearing.  HENT:     Head: Normocephalic and atraumatic.     Mouth/Throat:     Mouth: Mucous membranes are moist.     Comments: Moist mucous membranes Eyes:     Extraocular Movements: Extraocular movements intact.     Pupils: Pupils are equal, round, and reactive to light.  Cardiovascular:     Rate and Rhythm: Normal rate and regular rhythm.     Heart sounds: Normal heart sounds.  Pulmonary:     Effort: Pulmonary effort is normal.     Breath sounds: Normal breath sounds. No wheezing, rhonchi or rales.  Abdominal:     General: Bowel sounds are normal. There is no distension.     Palpations: Abdomen is soft. There is no mass.     Tenderness: There is no abdominal tenderness. There is no right CVA tenderness, left CVA tenderness, guarding or rebound.  Skin:    General: Skin is warm.     Capillary Refill: Capillary refill takes less than 2 seconds.     Comments: Good skin turgor  Neurological:      General: No focal deficit present.     Mental Status: She is alert and oriented to person, place, and time.  Psychiatric:        Mood and Affect: Mood normal.        Behavior: Behavior normal.     UC Treatments / Results  Labs (all labs ordered are listed, but only abnormal results are displayed) Labs Reviewed  POCT URINALYSIS DIPSTICK, ED / UC - Abnormal; Notable for the following components:      Result Value   Glucose, UA 250 (*)    Bilirubin Urine MODERATE (*)    Ketones, ur 15 (*)    Protein, ur 100 (*)    Nitrite POSITIVE (*)    Leukocytes,Ua LARGE (*)    All other components within normal limits  URINE CULTURE  POC URINE PREG, ED    EKG   Radiology No results found.  Procedures Procedures (including critical care time)  Medications Ordered in UC Medications - No data to display  Initial Impression / Assessment and Plan / UC Course  I have reviewed the triage vital signs and the nursing notes.  Pertinent labs & imaging results that were available during my care of the patient were reviewed by me and considered in my medical decision making (see chart for details).     This patient is a very pleasant 36 y.o. year old female presenting with recurrent cystitis. Afebrile, nontachycardic, no reproducible abd pain or CVAT. IUD contraception.  U-preg negative.  UA diffusely positive due to Azo. Culture sent.  Has completed 3 days of Bactrim.  Discussed options for management including waiting for culture results and continuing Bactrim versus stopping Bactrim and starting Macrobid.  She strongly prefers stopping Bactrim and starting Macrobid.  Sent.  Also sent Diflucan for yeast prophylaxis.  She is penicillin allergic.  ED return precautions discussed. Patient verbalizes understanding and agreement.    Final Clinical Impressions(s) / UC Diagnoses   Final diagnoses:  Acute cystitis without hematuria  Recurrent cystitis     Discharge Instructions       -Stop bactrim and start macrobid twice daily x5 days -For your yeast infection, start the Diflucan (fluconazole)- Take one pill today (day 1). If you're still having symptoms in 5 days, take the second pill.  -Drink plenty of fluids    ED Prescriptions     Medication Sig Dispense Auth. Provider   nitrofurantoin, macrocrystal-monohydrate, (MACROBID) 100 MG capsule Take 1 capsule (100 mg total) by mouth 2 (two) times daily. 10 capsule Hazel Sams, PA-C   fluconazole (DIFLUCAN) 150 MG tablet Take 1 tablet (150 mg total) by mouth daily. -For your yeast infection, start the Diflucan (fluconazole)- Take one pill today (day 1). If you're still having symptoms in 3 days, take the second pill. 2 tablet Hazel Sams, PA-C      PDMP not reviewed this encounter.   Hazel Sams, PA-C 06/11/21 2008

## 2021-06-11 NOTE — ED Triage Notes (Signed)
Patient has a history of uti.  Patient had a virtual visit on Sunday and bactrim was called in, initially felt like she was improving.  But now symptoms are worsening.  C/o urgency, frequency, no abdominal pain

## 2021-06-13 LAB — URINE CULTURE: Culture: <10000 — AB

## 2021-06-14 ENCOUNTER — Ambulatory Visit
Admission: RE | Admit: 2021-06-14 | Discharge: 2021-06-14 | Disposition: A | Discharge status: Home / Self Care | Payer: Medicaid Other | Source: Ambulatory Visit | Attending: Internal Medicine | Admitting: Internal Medicine

## 2021-06-14 DIAGNOSIS — E049 Nontoxic goiter, unspecified: Secondary | ICD-10-CM

## 2021-06-18 ENCOUNTER — Ambulatory Visit: Payer: Medicaid Other | Attending: Internal Medicine | Admitting: Physical Therapy

## 2021-09-18 ENCOUNTER — Ambulatory Visit (INDEPENDENT_AMBULATORY_CARE_PROVIDER_SITE_OTHER): Payer: Medicaid Other | Admitting: Primary Care

## 2021-09-18 ENCOUNTER — Encounter: Payer: Self-pay | Admitting: Primary Care

## 2021-09-18 ENCOUNTER — Encounter: Payer: Self-pay | Admitting: Physician Assistant

## 2021-09-18 VITALS — BP 100/66 | HR 98 | Temp 98.3°F | Ht 67.0 in | Wt 258.6 lb

## 2021-09-18 DIAGNOSIS — G4700 Insomnia, unspecified: Secondary | ICD-10-CM

## 2021-09-18 DIAGNOSIS — G4719 Other hypersomnia: Secondary | ICD-10-CM

## 2021-09-18 NOTE — Assessment & Plan Note (Signed)
-   She has trid several sleep aids without improvement including Ambien, Lunesta, Trazodone and Melatonin. Currently taking Lunesta 1mg . Diphenhydramine has helped some in the past but opthalmologist discouraged regular use d/t hx glaucoma. Encourage regular sleep scheduled. May benefit from cognitive behavioral therapy.

## 2021-09-18 NOTE — Patient Instructions (Addendum)
Recommendations: Keep regular sleep schedule, including bedtime and waketime Take scheduled naps during the day time 30-60 mins  Get 20-30 mins physical exercise daily Work on weight loss efforts Do not drive if experiencing excessive daytime sleepiness Discuss changing topamax to something else for headache as this cause cause disruption in sleep/insomnia   Orders: NPSG and MSLT (ordered)  Follow-up: 6-8 weeks with Dr. Craige Cotta, Wynona Neat, [place] or [place] (30 min slot- new patient insomnia)   Narcolepsy Narcolepsy is a neurological disorder that causes people to fall asleep suddenly and without control (have sleep attacks) during the daytime. It is a lifelong disorder. Narcolepsy disrupts the sleep cycle at night, which then causes daytime sleepiness. What are the causes? The cause of narcolepsy is not fully understood, but it may be related to: Low levels of hypocretin, a chemical (neurotransmitter) in the brain that controls sleep and wake cycles. Hypocretin imbalance may be caused by: Abnormal genes that are passed from parent to child (inherited). An autoimmune disease in which the body's defense system (immune system) attacks the brain cells that make hypocretin. Infection, tumor, or injury in the area of the brain that controls sleep. Exposure to poisons (toxins), such as heavy metals, pesticides, and secondhand smoke. What are the signs or symptoms? Symptoms of this condition include: Excessive daytime sleepiness. This is the most common symptom and is usually the first symptom you will notice. This may affect your performance at work or school. Sleep attacks. You may fall asleep in the middle of an activity, especially low-energy activities like reading or watching TV. Feeling like you cannot think clearly and trouble focusing or remembering things. You may also feel depressed. Sudden muscle weakness (cataplexy). When this occurs, your speech may become slurred, or your knees may  buckle. Cataplexy is usually triggered by surprise, anger, fear, or laughter. Losing the ability to speak or move (sleep paralysis). This may occur just as you start to fall asleep or wake up. You will be aware of the paralysis. It usually lasts for just a few seconds or minutes. Seeing, hearing, tasting, smelling, or feeling things that are not real (hallucinations). Hallucinations may occur with sleep paralysis. They can happen when you are falling asleep, waking up, or dozing. Trouble staying asleep at night (insomnia) and restless sleep. How is this diagnosed? This condition may be diagnosed based on: A physical exam to rule out any other problems that may be causing your symptoms. You may be asked to write down your sleeping patterns for several weeks in a sleep diary. This will help your health care provider make a diagnosis. Sleep studies that measure how well your REM sleep is regulated. These tests also measure your heart rate, breathing, movement, and brain waves. These tests include: An overnight sleep study (polysomnogram). A daytime sleep study that is done while you take several naps during the day (multiple sleep latency test, MSLT). This test measures how quickly you fall asleep and how quickly you enter REM sleep. Removal of spinal fluid to measure hypocretin levels. How is this treated? There is no cure for this condition, but treatment can help relieve symptoms. Treatment may include: Lifestyle and sleeping strategies to help you cope with the condition, such as: Exercising regularly. Maintaining a regular sleep schedule. Avoiding caffeine and large meals before bed. Medicines. These may include: Medicines that help keep you awake and alert (stimulants) to fight daytime sleepiness. Medicines that treat depression (antidepressants). These may be used to treat cataplexy. Sodium oxybate. This is a  strong medicine to help you relax (sedative) that you may take at night. It can help  control daytime sleepiness and cataplexy. Other treatments may include mental health counseling or joining a support group. Follow these instructions at home: Sleeping habits  Get about 8 hours of sleep every night. Go to sleep and get up at about the same time every day. Keep your bedroom dark, quiet, and comfortable. When you feel very tired, take short naps. Schedule naps so that you take them at about the same time every day. Before bedtime: Avoid bright lights and screens. Relax. Try activities like reading or taking a warm bath. Activity Get at least 20 minutes of exercise every day. This will help you sleep better at night and reduce daytime sleepiness. Avoid exercising within 3 hours of bedtime. Do not drive or use heavy machinery if you are sleepy. If possible, take a nap before driving. Do not swim or go out on the water without a life jacket. Eating and drinking Do not drink alcohol or caffeinated beverages within 4-5 hours of bedtime. Do not eat a large meal before bedtime. Eat meals at about the same times every day. General instructions  Take over-the-counter and prescription medicines only as told by your health care provider. Keep a sleep diary as told by your health care provider. Tell your employer or teachers that you have narcolepsy. You may be able to adjust your schedule to include time for naps. Do not use any products that contain nicotine or tobacco, such as cigarettes, e-cigarettes, and chewing tobacco. If you need help quitting, ask your health care provider. Keep all follow-up visits as told by your health care provider. This is important. Where to find more information [employer] of Neurological Disorders: [employer] Contact a health care provider if: Your symptoms are not getting better. You have increasingly high blood pressure (hypertension). You have changes in your heart rhythm. You are having a hard time determining what is real and  what is not (psychosis). Get help right away if you: Hurt yourself during a sleep attack or an attack of cataplexy. Have chest pain. Have trouble breathing. These symptoms may represent a serious problem that is an emergency. Do not wait to see if the symptoms will go away. Get medical help right away. Call your local emergency services (911 in the [place] Do not drive yourself to the hospital. Summary Narcolepsy is a neurological disorder that causes people to fall asleep suddenly, and without control, during the daytime (sleep attacks). It is a lifelong disorder. There is no cure for this condition, but treatment can help relieve symptoms. Go to sleep and get up at about the same time every day. Follow instructions about sleep and activities as told by your health care provider. Take over-the-counter and prescription medicines only as told by your health care provider. This information is not intended to replace advice given to you by your health care provider. Make sure you discuss any questions you have with your health care provider. Document Revised: 05/13/2021 Document Reviewed: 11/17/2018 Elsevier Patient Education  2023 [employer].

## 2021-09-18 NOTE — Assessment & Plan Note (Addendum)
-   Patient has symptoms insomnia, excessive daytime sleepiness, restless sleep.  Epworth 23/24. BMI 40. Concern patient could have obstructive sleep apnea and/or narcolepsy, needs NPSG and MSLT to assess. Discussed risk of untreated sleep apnea including cardiac arrhythmias, pulm HTN, stroke, DM. We briefly reviewed treatment options. Encouraged patient to work on weight loss efforts and focus on side sleeping position/elevate head of bed. Advised against driving if experiencing excessive daytime sleepiness. Recommend scheduled naps and daily exercise. Follow-up in 4-6 weeks with sleep doc to review sleep study results and discuss treatment options further.

## 2021-09-18 NOTE — Progress Notes (Signed)
@Patient  ID: Christina Morton, female    DOB: [birth year], 36 y.o.   MRN: [id number]  Chief Complaint  Patient presents with   Consult    Told she does not have sleep apnea.  Tired all the time.  Falls asleep anytime she sits still long enough.  Has had no accidents.    Referring provider: Audley Hose, MD  HPI: 36 year old female.  Past medical history significant for insomnia, type 2 diabetes iron deficiency anemia, depression, glaucoma, obesity.  09/18/2021 Patient presents today for new sleep consult.  Patient reports symptoms of insomnia, fallas asleep while driving, restless sleep and exhausted. She had NPSG on 05/17/21 that showed no evidence of sleep apnea, average AHI 0.6/hr with SpO2 low 92%. She falls asleep very easily during the day. She is tired all the time. Her typicl bedtime varies, she try's to go to sleep by 11pm. Sometimes it will take her 1-4 hours to fall asleep. She wakes up on average once a night. She starts her day at 7:30 to help her son get ready for virtual home schooling. She will lay back down around 8am and it might take her until 9:30-10am to fall asleep and she will get an additional 1-2 hours of sleep. She has tried several sleep aids in the past including 5mg  Ambien, 2-4mg  Lunesta, melatonin and trazodone in the past without improvement.  He is currently being weaned off of Lunesta and is on 1 mg at bedtime.  She has used Benadryl in the past with some results however her eye doctor told her not to take this regularly due to her history of glaucoma.  Concern for possible narcolepsy.  No symptoms of cataplexy or sleepwalking.  Sleep questionnaire Symptoms-  insomnia, fallings asleep while driving, restless sleep, exhausted Prior sleep study- NPSG on 05/17/21 that showed no evidence of sleep apnea, average AHI 0.6/hr with SpO2 low 92%.  Bedtime- Varies, 11pm Time to fall asleep- Several hours  Nocturnal awakenings- 1x Out of bed/start of day- Varies; 8:30am  but goes back to sleep for another hours Weight changes- +/- 10 lbs Do you operate heavy machinery- No, not currently working Do you currently wear CPAP- No Do you current wear oxygen- No  Epworth- 23    Allergies  Allergen Reactions   Amoxicillin Anaphylaxis    Did it involve swelling of the face/tongue/throat, SOB, or low BP? Yes Did it involve sudden or severe rash/hives, skin peeling, or any reaction on the inside of your mouth or nose? No Did you need to seek medical attention at a hospital or doctor's office? Yes When did it last happen?      childhood allergy If all above answers are "NO", may proceed with cephalosporin use.   Penicillins Anaphylaxis    Did it involve swelling of the face/tongue/throat, SOB, or low BP? Yes Did it involve sudden or severe rash/hives, skin peeling, or any reaction on the inside of your mouth or nose? No Did you need to seek medical attention at a hospital or doctor's office? Yes When did it last happen?      childhood allergy If all above answers are "NO", may proceed with cephalosporin use.     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 Take immediately before bedtime 30 tablet 1   famotidine (PEPCID) 20 MG tablet Take 1 tablet (20 mg total) by mouth at bedtime. 30 tablet 1   FLUoxetine (PROZAC) 10 MG capsule 1 capsule     glipiZIDE (GLUCOTROL XL) 10 MG 24 hr tablet Take 10 mg by mouth daily.     levonorgestrel (MIRENA) 20 MCG/DAY IUD by Intrauterine route.     rosuvastatin (CRESTOR) 10 MG tablet Take 1 tablet by mouth daily.     Semaglutide, 2 MG/DOSE, (OZEMPIC, 2 MG/DOSE,) 8 MG/3ML SOPN Ozempic 2 mg/dose (8 mg/3 mL) subcutaneous pen injector  INJECT 2 MG EVERY WEEK BY SUBCUTANEOUS ROUTE     topiramate (TOPAMAX) 50 MG tablet Take 25mg  at hs for one week and then 50mg  at thereforth at hs     fluconazole (DIFLUCAN) 150 MG tablet Take 1 tablet (150 mg total) by mouth daily. -For your yeast infection, start the Diflucan (fluconazole)- Take one pill today (day 1). If you're still having symptoms in 3 days, take the second pill. 2 tablet 0   rizatriptan (MAXALT) 10 MG tablet Take 1 tablet earliest onset of migraine.  May repeat in 2 hours if needed.  Maximum 2 tablets in 24 hours 10 tablet 3   benzonatate (TESSALON PERLES) 100 MG capsule Take 1 capsule (100 mg total) by mouth 3 (three) times daily as needed. (Patient not taking: Reported on 06/11/2021) 20 capsule 0   gabapentin (NEURONTIN) 100 MG capsule Take 600 mg by mouth 2 (two) times daily. (Patient not taking: Reported on 09/18/2021)     gabapentin (NEURONTIN) 300 MG capsule Take 300 mg by mouth 2 (two) times daily. (Patient not taking: Reported on 09/18/2021)     nitrofurantoin, macrocrystal-monohydrate, (MACROBID) 100 MG capsule Take 1 capsule (100 mg total) by mouth 2 (two) times daily. (Patient not taking: Reported on 09/18/2021) 10 capsule 0   sertraline (ZOLOFT) 50 MG tablet 1 tablet (Patient not taking: Reported  on 09/18/2021)     sulfamethoxazole-trimethoprim (BACTRIM DS) 800-160 MG tablet Take 1 tablet by mouth 2 (two) times daily. (Patient not taking: Reported on 09/18/2021) 14 tablet 0   TRULICITY 1.5 0000000 SOPN Inject 1.5 mg into the skin once a week.     No facility-administered medications prior to visit.   Review of Systems  Review of Systems  Constitutional:  Positive for fatigue.  Respiratory: Negative.    Psychiatric/Behavioral:  Positive for sleep disturbance.     Physical Exam  BP 100/66 (BP Location: Left Arm, Patient Position: Sitting, Cuff Size: Large)   Pulse 98   Temp 98.3 F (36.8 C) (Oral)   Ht 5\' 7"  (  1.702 m)   Wt 258 lb 9.6 oz (117.3 kg)   SpO2 98%   BMI 40.50 kg/m  Physical Exam Constitutional:      Appearance: Normal appearance. She is obese. She is not ill-appearing.  HENT:     Head: Normocephalic and atraumatic.     Mouth/Throat:     Comments: Mallampati Class II Cardiovascular:     Rate and Rhythm: Normal rate and regular rhythm.  Pulmonary:     Effort: Pulmonary effort is normal.     Breath sounds: Wheezing present. No rhonchi or rales.  Skin:    General: Skin is warm and dry.  Neurological:     General: No focal deficit present.     Mental Status: She is alert and oriented to person, place, and time. Mental status is at baseline.  Psychiatric:        Mood and Affect: Mood normal.        Behavior: Behavior normal.        Thought Content: Thought content normal.        Judgment: Judgment normal.     Lab Results:  CBC    Component Value Date/Time   WBC 4.6 05/21/2021 1012   WBC 8.7 07/22/2018 0545   RBC 5.50 (H) 05/21/2021 1012   HGB 12.0 05/21/2021 1012   HGB 9.7 (L) 05/24/2018 1351   HGB 11.2 01/07/2018 0000   HCT 40.0 05/21/2021 1012   HCT 30.8 (L) 05/24/2018 1351   HCT 36 01/07/2018 0000   PLT 331 05/21/2021 1012   PLT 376 05/24/2018 1351   PLT 409 01/07/2018 0000   MCV 72.7 (L) 05/21/2021 1012   MCV 67 (L) 05/24/2018 1351    MCH 21.8 (L) 05/21/2021 1012   MCHC 30.0 05/21/2021 1012   RDW 14.7 05/21/2021 1012   RDW 16.5 (H) 05/24/2018 1351   LYMPHSABS 2.0 05/21/2021 1012   MONOABS 0.4 05/21/2021 1012   EOSABS 0.1 05/21/2021 1012   BASOSABS 0.0 05/21/2021 1012    BMET    Component Value Date/Time   NA 139 08/04/2019 1054   K 4.2 08/04/2019 1054   CL 107 (H) 08/04/2019 1054   CO2 18 (L) 08/04/2019 1054   GLUCOSE 108 (H) 08/04/2019 1054   GLUCOSE 110 (H) 07/23/2018 0555   BUN 9 08/04/2019 1054   CREATININE 0.89 08/04/2019 1054   CALCIUM 9.6 08/04/2019 1054   GFRNONAA 85 08/04/2019 1054   GFRAA 98 08/04/2019 1054    BNP No results found for: BNP  ProBNP No results found for: PROBNP  Imaging: No results found.   Assessment & Plan:   Excessive daytime sleepiness - Patient has symptoms insomnia, excessive daytime sleepiness, restless sleep.  Epworth 23/24. BMI 40. Concern patient could have obstructive sleep apnea and/or narcolepsy, needs NPSG and MSLT to assess. Discussed risk of untreated sleep apnea including cardiac arrhythmias, pulm HTN, stroke, DM. We briefly reviewed treatment options. Encouraged patient to work on weight loss efforts and focus on side sleeping position/elevate head of bed. Advised against driving if experiencing excessive daytime sleepiness. Recommend scheduled naps and daily exercise. Follow-up in 4-6 weeks with sleep doc to review sleep study results and discuss treatment options further.    Insomnia - She has trid several sleep aids without improvement including Ambien, Lunesta, Trazodone and Melatonin. Currently taking Lunesta 1mg . Diphenhydramine has helped some in the past but opthalmologist discouraged regular use d/t hx glaucoma. Encourage regular sleep scheduled. May benefit from cognitive behavioral therapy.  Martyn Ehrich,  NP 09/18/2021  

## 2021-10-30 NOTE — Progress Notes (Deleted)
@Patient  ID: , female    DOB: [birth year], 36 y.o.   MRN: [id number]  [id number] 09/18/21 09/20/21, NP Referring provider: Clent Ridges, MD  HPI: 36 year old female.  Past medical history significant for insomnia, type 2 diabetes iron deficiency anemia, depression, glaucoma, obesity.  09/18/2021 Patient presents today for new sleep consult.  Patient reports symptoms of insomnia, fallas asleep while driving, restless sleep and exhausted. She had NPSG on 05/17/21 that showed no evidence of sleep apnea, average AHI 0.6/hr with SpO2 low 92%. She falls asleep very easily during the day. She is tired all the time. Her typicl bedtime varies, she try's to go to sleep by 11pm. Sometimes it will take her 1-4 hours to fall asleep. She wakes up on average once a night. She starts her day at 7:30 to help her son get ready for virtual home schooling. She will lay back down around 8am and it might take her until 9:30-10am to fall asleep and she will get an additional 1-2 hours of sleep. She has tried several sleep aids in the past including 5mg  Ambien, 2-4mg  Lunesta, melatonin and trazodone in the past without improvement.  He is currently being weaned off of Lunesta and is on 1 mg at bedtime.  She has used Benadryl in the past with some results however her eye doctor told her not to take this regularly due to her history of glaucoma.  Concern for possible narcolepsy.  No symptoms of cataplexy or sleepwalking.     Assessment & Plan:   Excessive daytime sleepiness - Patient has symptoms insomnia, excessive daytime sleepiness, restless sleep.  Epworth 23/24. BMI 40. Concern patient could have obstructive sleep apnea and/or narcolepsy, needs NPSG and MSLT to assess. Discussed risk of untreated sleep apnea including cardiac arrhythmias, pulm HTN, stroke, DM. We briefly reviewed treatment options. Encouraged patient to work on weight loss efforts and focus on side sleeping position/elevate head of bed. Advised  against driving if experiencing excessive daytime sleepiness. Recommend scheduled naps and daily exercise. Follow-up in 4-6 weeks with sleep doc to review sleep study results and discuss treatment options further.  Insomnia - She has trid several sleep aids without improvement including Ambien, Lunesta, Trazodone and Melatonin. Currently taking Lunesta 1mg . Diphenhydramine has helped some in the past but opthalmologist discouraged regular use d/t hx glaucoma. Encourage regular sleep scheduled. May benefit from cognitive behavioral therapy.  05/19/21, NP 09/18/2021 ------------------------------------  10/31/21- 36 yoF following for Chronic Insomnia, Daytime Sleepiness, complicated by DM2, Cervical Radiculopathy, Skin-picking Disorder, Glaucoma, Depression, FE Anemia,  NPSG 05/17/21- AHI 0.6/ hr, desaturation to 92%, body weight 250 lbs F/U NPSG/MSLT ordered, not done -Lunesta,1mg , Prozac,  NPSG 05/17/21- AHI 0.6/ hr, desaturation to 92%, body weight 250 lbs. REM 26.9% of TST, REM Latency 35 minutes  No medication. Scheduled for NPSG/MSLT 7/25, 7/26  ROS-see HPI   + = positive Constitutional:    weight loss, night sweats, fevers, chills, fatigue, lassitude. HEENT:    headaches, difficulty swallowing, tooth/dental problems, sore throat,       sneezing, itching, ear ache, nasal congestion, post nasal drip, snoring CV:    chest pain, orthopnea, PND, swelling in lower extremities, anasarca,                                  dizziness, palpitations Resp:   shortness of breath with exertion or at rest.  productive cough,   non-productive cough, coughing up of blood.              change in color of mucus.  wheezing.   Skin:    rash or lesions. GI:  No-   heartburn, indigestion, abdominal pain, nausea, vomiting, diarrhea,                 change in bowel habits, loss of appetite GU: dysuria, change in color of urine, no urgency or frequency.   flank pain. MS:   joint pain,  stiffness, decreased range of motion, back pain. Neuro-     nothing unusual Psych:  change in mood or affect.  depression or anxiety.   memory loss.  OBJ- Physical Exam General- Alert, Oriented, Affect-appropriate, Distress- none acute Skin- rash-none, lesions- none, excoriation- none Lymphadenopathy- none Head- atraumatic            Eyes- Gross vision intact, PERRLA, conjunctivae and secretions clear            Ears- Hearing, canals-normal            Nose- Clear, no-Septal dev, mucus, polyps, erosion, perforation             Throat- Mallampati II , mucosa clear , drainage- none, tonsils- atrophic Neck- flexible , trachea midline, no stridor , thyroid nl, carotid no bruit Chest - symmetrical excursion , unlabored           Heart/CV- RRR , no murmur , no gallop  , no rub, nl s1 s2                           - JVD- none , edema- none, stasis changes- none, varices- none           Lung- clear to P&A, wheeze- none, cough- none , dullness-none, rub- none           Chest wall-  Abd-  Br/ Gen/ Rectal- Not done, not indicated Extrem- cyanosis- none, clubbing, none, atrophy- none, strength- nl Neuro- grossly intact to observation

## 2021-10-31 ENCOUNTER — Ambulatory Visit: Payer: Medicaid Other | Admitting: Internal Medicine

## 2021-11-12 ENCOUNTER — Ambulatory Visit (HOSPITAL_BASED_OUTPATIENT_CLINIC_OR_DEPARTMENT_OTHER): Payer: Medicaid Other | Attending: Primary Care | Admitting: Internal Medicine

## 2021-11-12 VITALS — Ht 68.0 in | Wt 250.0 lb

## 2021-11-12 DIAGNOSIS — R0683 Snoring: Secondary | ICD-10-CM | POA: Diagnosis not present

## 2021-11-12 DIAGNOSIS — R400 Somnolence: Secondary | ICD-10-CM | POA: Diagnosis not present

## 2021-11-12 DIAGNOSIS — G4700 Insomnia, unspecified: Secondary | ICD-10-CM | POA: Diagnosis not present

## 2021-11-12 DIAGNOSIS — G4719 Other hypersomnia: Secondary | ICD-10-CM

## 2021-11-13 ENCOUNTER — Ambulatory Visit (HOSPITAL_BASED_OUTPATIENT_CLINIC_OR_DEPARTMENT_OTHER): Payer: Medicaid Other | Attending: Primary Care | Admitting: Internal Medicine

## 2021-11-13 DIAGNOSIS — G4700 Insomnia, unspecified: Secondary | ICD-10-CM | POA: Insufficient documentation

## 2021-11-13 DIAGNOSIS — G4719 Other hypersomnia: Secondary | ICD-10-CM | POA: Diagnosis not present

## 2021-11-13 DIAGNOSIS — G4710 Hypersomnia, unspecified: Secondary | ICD-10-CM | POA: Diagnosis not present

## 2021-11-17 DIAGNOSIS — G4700 Insomnia, unspecified: Secondary | ICD-10-CM | POA: Diagnosis not present

## 2021-11-17 NOTE — Procedures (Signed)
    Patient Name: Christina Morton, Christina Morton Date: 11/13/2021 Gender: Female D.O.B: 10-Jul-1985 Age (years): 36 Referring Provider: Ames Dura NP Height (inches): 68 Interpreting Physician: Jetty Duhamel MD, ABSM Weight (lbs): 250 RPSGT: [place] BMI: 38 MRN: [id number] Neck Size: 16.00  CLINICAL INFORMATION Sleep Study Type: MSLT The patient was referred to the sleep center for evaluation of daytime sleepiness. Epworth Sleepiness Score: 24  Most recent polysomnogram dated 11/12/21 recorded AHI 2.5/ hr, Total Sleep Time 360 minutes, sleep latency 14.4 minutes, efficiency 89%, REM latency 67.5 minutes, REM time 98.5 minutes (27.4% of TST). SLEEP STUDY TECHNIQUE  A Multiple Sleep Latency Test was performed after an overnight polysomnogram according to the AASM scoring manual v2.3 (April 2016) and clinical guidelines. Five nap opportunities occurred over the course of the test which followed an overnight polysomnogram. The channels recorded and monitored were frontal, central, and occipital electroencephalography (EEG), right and left electrooculogram (EOG), chin electromyography (EMG), and electrocardiogram (EKG).  MEDICATIONS Medications taken by the patient : None reported Medications administered by patient during sleep study : No sleep medicine administered.  IMPRESSIONS - Total number of naps attempted: 5 . Total number of naps with sleep attained: 5 . The Mean Sleep Latency was 5.86 minutes. There was 1 sleep-onset REM period. - The patient appears to have pathologic sleepiness, evidenced by a short mean sleep latency (8 minutes or less) on this MSLT. - Protocol for clear diagnosis of Narcolepsy requires evidence of increased REM pressure as indicated by REM during at least 2 of 5 naps. In this study there was only one nap with REM. The patient was unclear about recent medication history. SSRI antidepressants will suppress REM, but recent discontinuation may be followed by  REM rebound. Clinical judgment advised.  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  [Electronically signed] 11/17/2021 12:22 PM  Jetty Duhamel MD, ABSM Diplomate, American Board of Sleep Medicine NPI: [id number]                         Jetty Duhamel Diplomate, American Board of Sleep Medicine  ELECTRONICALLY SIGNED ON:  11/17/2021, 12:07 PM [facility] PH: [phone]   FX: [phone] ACCREDITED BY THE AMERICAN ACADEMY OF SLEEP MEDICINE

## 2021-11-17 NOTE — Procedures (Signed)
    Patient Name: Christina Morton, Christina Morton Date: 11/12/2021 Gender: Female D.O.B: 10/14/1985 Age (years): 36 Referring Provider: Ames Dura NP Height (inches): 68 Interpreting Physician: Jetty Duhamel MD, ABSM Weight (lbs): 250 RPSGT: [place] BMI: 38 MRN: [id number] Neck Size: 16.00  CLINICAL INFORMATION Sleep Study Type: NPSG Indication for sleep study: Fatigue, Obesity, Snoring, Witnesses Apnea / Gasping During Sleep Epworth Sleepiness Score: 24  Most recent polysomnogram dated 05/17/2021 revealed an AHI of 0.6/h and RDI of 0.6/h.  SLEEP STUDY TECHNIQUE As per the AASM Manual for the Scoring of Sleep and Associated Events v2.3 (April 2016) with a hypopnea requiring 4% desaturations.  The channels recorded and monitored were frontal, central and occipital EEG, electrooculogram (EOG), submentalis EMG (chin), nasal and oral airflow, thoracic and abdominal wall motion, anterior tibialis EMG, snore microphone, electrocardiogram, and pulse oximetry.  MEDICATIONS Medications self-administered by patient taken the night of the study : None reported during study. However patient was unclear as to whether she had recently taken [place] or Prozac per tech notes.  SLEEP ARCHITECTURE The study was initiated at 11:15:43 PM and ended at 6:00:24 AM.  Sleep onset time was 14.4 minutes and the sleep efficiency was 89.0%%. The total sleep time was 360 minutes.  Stage REM latency was 67.5 minutes.  The patient spent 3.5%% of the night in stage N1 sleep, 69.2%% in stage N2 sleep, 0.0%% in stage N3 and 27.4% in REM.  Alpha intrusion was absent.  Supine sleep was 0.00%.  RESPIRATORY PARAMETERS The overall apnea/hypopnea index (AHI) was 2.5 per hour. There were 6 total apneas, including 0 obstructive, 6 central and 0 mixed apneas. There were 9 hypopneas and 6 RERAs.  The AHI during Stage REM sleep was 8.5 per hour.  AHI while supine was N/A per hour.  The mean oxygen saturation was  96.3%. The minimum SpO2 during sleep was 86.0%.  moderate snoring was noted during this study.  CARDIAC DATA The 2 lead EKG demonstrated sinus rhythm. The mean heart rate was 80.2 beats per minute. Other EKG findings include: None.  LEG MOVEMENT DATA The total PLMS were 0 with a resulting PLMS index of 0.0. Associated arousal with leg movement index was 0.0 .  IMPRESSIONS - No significant obstructive sleep apnea occurred during this study (AHI = 2.5/h). - No significant central sleep apnea occurred during this study (CAI = 1/h). - Mild oxygen desaturation was noted during this study (Min O2 = 86.0%). Mean 96.3%. Time with O2 saturation 88% or less was 0.4 minutes. - The patient snored with moderate snoring volume. - No cardiac abnormalities were noted during this study. - Clinically significant periodic limb movements did not occur during sleep. No significant associated arousals.  DIAGNOSIS - Primary Snoring  RECOMMENDATIONS - See result of MSLT following this study. - Sleep hygiene should be reviewed to assess factors that may improve sleep quality. - Weight management and regular exercise should be initiated or continued if appropriate.  [Electronically signed] 11/17/2021 12:03 PM  Jetty Duhamel MD, ABSM Diplomate, American Board of Sleep Medicine NPI: [id number]                         Jetty Duhamel Diplomate, American Board of Sleep Medicine  ELECTRONICALLY SIGNED ON:  11/17/2021, 12:04 PM  [facility] PH: [phone]   FX: [phone] ACCREDITED BY THE AMERICAN ACADEMY OF SLEEP MEDICINE

## 2021-11-18 ENCOUNTER — Inpatient Hospital Stay (HOSPITAL_BASED_OUTPATIENT_CLINIC_OR_DEPARTMENT_OTHER): Payer: Medicaid Other | Admitting: Hematology and Oncology

## 2021-11-18 ENCOUNTER — Inpatient Hospital Stay: Payer: Medicaid Other | Attending: Hematology and Oncology

## 2021-11-18 ENCOUNTER — Other Ambulatory Visit: Payer: Self-pay

## 2021-11-18 ENCOUNTER — Other Ambulatory Visit: Payer: Self-pay | Admitting: Hematology and Oncology

## 2021-11-18 VITALS — BP 120/81 | HR 91 | Temp 99.2°F | Resp 17 | Wt 253.9 lb

## 2021-11-18 DIAGNOSIS — R5383 Other fatigue: Secondary | ICD-10-CM | POA: Diagnosis not present

## 2021-11-18 DIAGNOSIS — Z803 Family history of malignant neoplasm of breast: Secondary | ICD-10-CM | POA: Insufficient documentation

## 2021-11-18 DIAGNOSIS — D509 Iron deficiency anemia, unspecified: Secondary | ICD-10-CM | POA: Diagnosis present

## 2021-11-18 DIAGNOSIS — D508 Other iron deficiency anemias: Secondary | ICD-10-CM

## 2021-11-18 DIAGNOSIS — E119 Type 2 diabetes mellitus without complications: Secondary | ICD-10-CM | POA: Diagnosis not present

## 2021-11-18 DIAGNOSIS — Z87891 Personal history of nicotine dependence: Secondary | ICD-10-CM | POA: Diagnosis not present

## 2021-11-18 DIAGNOSIS — R718 Other abnormality of red blood cells: Secondary | ICD-10-CM

## 2021-11-18 DIAGNOSIS — D563 Thalassemia minor: Secondary | ICD-10-CM | POA: Insufficient documentation

## 2021-11-18 LAB — CBC WITH DIFFERENTIAL (CANCER CENTER ONLY)
Abs Immature Granulocytes: 0.01 10*3/uL (ref 0.00–0.07)
Basophils Absolute: 0 10*3/uL (ref 0.0–0.1)
Basophils Relative: 0 %
Eosinophils Absolute: 0.1 10*3/uL (ref 0.0–0.5)
Eosinophils Relative: 2 %
HCT: 43 % (ref 36.0–46.0)
Hemoglobin: 12.9 g/dL (ref 12.0–15.0)
Immature Granulocytes: 0 %
Lymphocytes Relative: 42 %
Lymphs Abs: 2 10*3/uL (ref 0.7–4.0)
MCH: 21.8 pg — ABNORMAL LOW (ref 26.0–34.0)
MCHC: 30 g/dL (ref 30.0–36.0)
MCV: 72.6 fL — ABNORMAL LOW (ref 80.0–100.0)
Monocytes Absolute: 0.7 10*3/uL (ref 0.1–1.0)
Monocytes Relative: 14 %
Neutro Abs: 2 10*3/uL (ref 1.7–7.7)
Neutrophils Relative %: 42 %
Platelet Count: 349 10*3/uL (ref 150–400)
RBC: 5.92 MIL/uL — ABNORMAL HIGH (ref 3.87–5.11)
RDW: 15.5 % (ref 11.5–15.5)
WBC Count: 4.7 10*3/uL (ref 4.0–10.5)
nRBC: 0 % (ref 0.0–0.2)

## 2021-11-18 LAB — CMP (CANCER CENTER ONLY)
ALT: 10 U/L (ref 0–44)
AST: 10 U/L — ABNORMAL LOW (ref 15–41)
Albumin: 4.2 g/dL (ref 3.5–5.0)
Alkaline Phosphatase: 81 U/L (ref 38–126)
Anion gap: 6 (ref 5–15)
BUN: 6 mg/dL (ref 6–20)
CO2: 28 mmol/L (ref 22–32)
Calcium: 9.4 mg/dL (ref 8.9–10.3)
Chloride: 105 mmol/L (ref 98–111)
Creatinine: 1.05 mg/dL — ABNORMAL HIGH (ref 0.44–1.00)
GFR, Estimated: >60 mL/min (ref >60)
Glucose, Bld: 122 mg/dL — ABNORMAL HIGH (ref 70–99)
Potassium: 3.9 mmol/L (ref 3.5–5.1)
Sodium: 139 mmol/L (ref 135–145)
Total Bilirubin: 0.4 mg/dL (ref 0.3–1.2)
Total Protein: 7.9 g/dL (ref 6.5–8.1)

## 2021-11-18 LAB — IRON AND IRON BINDING CAPACITY (CC-WL,HP ONLY)
Iron: 53 ug/dL (ref 28–170)
Saturation Ratios: 19 % (ref 10.4–31.8)
TIBC: 283 ug/dL (ref 250–450)
UIBC: 230 ug/dL (ref 148–442)

## 2021-11-18 LAB — RETIC PANEL
Immature Retic Fract: 9.4 % (ref 2.3–15.9)
RBC.: 5.95 MIL/uL — ABNORMAL HIGH (ref 3.87–5.11)
Retic Count, Absolute: 114.8 10*3/uL (ref 19.0–186.0)
Retic Ct Pct: 1.9 % (ref 0.4–3.1)
Reticulocyte Hemoglobin: 24.3 pg — ABNORMAL LOW (ref >27.9)

## 2021-11-18 LAB — FERRITIN: Ferritin: 156 ng/mL (ref 11–307)

## 2021-11-18 NOTE — Progress Notes (Signed)
[facility] [phone]   [phone]  PROGRESS NOTE  Patient Care Team: Harvest Forest, MD as PCP - General (Internal Medicine) Jodelle Red, MD as PCP - Cardiology (Cardiology) Drema Dallas, DO as Consulting Physician (Neurology)  Hematological/Oncological History 1) 08/10/2020: Labs from [facility] PA-C from [facility] -WBC 6.5, Hgb 10.8 (L), MCV 64.5 (L), Plt 372, Ferritin 26.6, Iron 32 (L), Transferrin 237, Iron Saturation 10% (L), TIBC 332, negative hemoccult test  2) 08/27/2020: Establish care with Christina Kaufmann PA-C. Recommended ferrous sulfate 325 mg once daily.   3) Received IV feraheme x 2 doses on 01/07/2021 and 01/14/2021.   CHIEF COMPLAINTS/PURPOSE OF CONSULTATION:  "Iron deficiency anemia "  HISTORY OF PRESENTING ILLNESS:  Christina Morton 36 y.o. female returns for follow-up for iron deficiency anemia.  Patient is unaccompanied for this visit.  Her last visit was on 08/27/2020 when she established care with Christina Morton.   On exam today [name] reports that she remains tired and exhausted.  She reports that her energy is 0-10 and she has "no energy" she notes that she has been "dealing with this my whole life".  She notes that she stopped taking iron pills as they are not working.  She reports that she is not having any shortness of breath or lightheadedness.  She reports her appetite is okay and her weight has been steady.  She reports that he typically eats once or twice per day.  She notes that she eats red meat at least once per day.  She notes that she does not currently have any menstrual cycles because she is using her Mirena.  She is not having any bleeding from any other sources.  She denies any nosebleeds, gum bleeding, or dark stools.  A full 10 point ROS is listed below.  MEDICAL HISTORY:  Past Medical History:  Diagnosis Date   Anemia    Depression    Glaucoma    History of cesarean section, classical  01/21/2018   Low transverse with 5 cm extension into muscular layer--was told at 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   Pt had laparoscopic curgery for urachal cyst    SURGICAL HISTORY: Past Surgical History:  Procedure Laterality Date   CESAREAN SECTION     CESAREAN SECTION WITH BILATERAL TUBAL LIGATION Bilateral 07/21/2018   Procedure: CESAREAN SECTION WITH BILATERAL TUBAL LIGATION;  Surgeon: Kathrynn Running, MD;  Location: [facility] LD [facility]S;  Service: Obstetrics;  Laterality: Bilateral;   CYSTECTOMY      SOCIAL HISTORY: Social History   Socioeconomic History   Marital status: Single    Spouse name: Not on file   Number of children: 2   Years of education: Not on file   Highest education level: Some college, no degree  Occupational History   Occupation: not working  Tobacco Use   Smoking status: Former    Packs/day: 1.00    Years: 1.00    Total pack years: 1.00    Types: Cigarettes    Quit date: 11/22/2017    Years since quitting: 4.0   Smokeless tobacco: Never  Vaping Use   Vaping Use: Never used  Substance and Sexual Activity   Alcohol use: Yes    Comment: once a monthly   Drug use: Yes    Types: Marijuana    Comment: occasionally, once  a month   Sexual activity: Yes    Birth control/protection: I.U.D.  Other Topics Concern   Not on file  Social History Narrative   Right handed   Two story    No caffeine, maybe coffee once a week   Social Determinants of Health   Financial Resource Strain: Not on file  Food Insecurity: No Food Insecurity (06/21/2018)   Hunger Vital Sign    Worried About Running Out of Food in the Last Year: Never true    Ran Out of Food in the Last Year: Never true  Transportation Needs: No Transportation Needs (06/21/2018)   PRAPARE - Administrator, Civil Service (Medical): No    Lack of Transportation  (Non-Medical): No  Physical Activity: Not on file  Stress: Not on file  Social Connections: Not on file  Intimate Partner Violence: Not At Risk (07/08/2018)   Humiliation, Afraid, Rape, and Kick questionnaire    Fear of Current or Ex-Partner: No    Emotionally Abused: No    Physically Abused: No    Sexually Abused: No    FAMILY HISTORY: Family History  Problem Relation Age of Onset   Hypertrophic cardiomyopathy Mother    Bradycardia Mother    Healthy Father    Breast cancer Maternal Great-grandmother     ALLERGIES:  is allergic to amoxicillin and penicillins.  MEDICATIONS:  Current Outpatient Medications  Medication Sig Dispense Refill   Cholecalciferol (VITAMIN D3 ADULT GUMMIES) 25 MCG (1000 UT) CHEW 1 tablet     eszopiclone (LUNESTA) 1 MG TABS tablet Take 1 tablet (1 mg total) by mouth at bedtime as needed for sleep. Take immediately before bedtime 30 tablet 1   famotidine (PEPCID) 20 MG tablet Take 1 tablet (20 mg total) by mouth at bedtime. 30 tablet 1   FLUoxetine (PROZAC) 10 MG capsule 1 capsule     glipiZIDE (GLUCOTROL XL) 10 MG 24 hr tablet Take 10 mg by mouth daily.     levonorgestrel (MIRENA) 20 MCG/DAY IUD by Intrauterine route.     rizatriptan (MAXALT) 10 MG tablet Take 1 tablet earliest onset of migraine.  May repeat in 2 hours if needed.  Maximum 2 tablets in 24 hours 10 tablet 3   rosuvastatin (CRESTOR) 10 MG tablet Take 1 tablet by mouth daily.     Semaglutide, 2 MG/DOSE, (OZEMPIC, 2 MG/DOSE,) 8 MG/3ML SOPN Ozempic 2 mg/dose (8 mg/3 mL) subcutaneous pen injector  INJECT 2 MG EVERY WEEK BY SUBCUTANEOUS ROUTE     topiramate (TOPAMAX) 50 MG tablet Take 25mg  at hs for one week and then 50mg  at thereforth at hs     No current facility-administered medications for this visit.    REVIEW OF SYSTEMS:   Constitutional: ( - ) fevers, ( - )  chills , ( - ) night sweats Eyes: ( - ) blurriness of vision, ( - ) double vision, ( - ) watery eyes Ears, nose, mouth, throat,  and face: ( - ) mucositis, ( - ) sore throat Respiratory: ( - ) cough, ( - ) dyspnea, ( - ) wheezes Cardiovascular: ( - ) palpitation, ( - ) chest discomfort, ( - ) lower extremity swelling Gastrointestinal:  ( - ) nausea, ( - ) heartburn, ( - ) change in bowel habits Skin: ( - ) abnormal skin rashes Lymphatics: ( - ) new lymphadenopathy, ( - ) easy bruising Neurological: ( - ) numbness, ( - ) tingling, ( - ) new weaknesses Behavioral/Psych: ( - )  mood change, ( - ) new changes  All other systems were reviewed with the patient and are negative.  PHYSICAL EXAMINATION: ECOG PERFORMANCE STATUS: 1 - Symptomatic but completely ambulatory  Vitals:   11/18/21 1052  BP: 120/81  Pulse: 91  Resp: 17  Temp: 99.2 F (37.3 C)  SpO2: 100%     Filed Weights   11/18/21 1052  Weight: 253 lb 14.4 oz (115.2 kg)      GENERAL: well appearing African American female in NAD  SKIN: skin color, texture, turgor are normal, no rashes or significant lesions EYES: conjunctiva are pink and non-injected, sclera clear OROPHARYNX: no exudate, no erythema; lips, buccal mucosa, and tongue normal  NECK: supple, non-tender LYMPH:  no palpable lymphadenopathy in the cervical or supraclavicular lymph nodes.  LUNGS: clear to auscultation and percussion with normal breathing effort HEART: regular rate & rhythm and no murmurs and no lower extremity edema ABDOMEN: soft, non-tender, non-distended, normal bowel sounds Musculoskeletal: no cyanosis of digits and no clubbing  PSYCH: alert & oriented x 3, fluent speech NEURO: no focal motor/sensory deficits  LABORATORY DATA:  I have reviewed the data as listed    Latest Ref Rng & Units 11/18/2021   10:15 AM 05/21/2021   10:12 AM 02/13/2021    1:33 PM  CBC  WBC 4.0 - 10.5 K/uL 4.7  4.6  7.0   Hemoglobin 12.0 - 15.0 g/dL 12.9  12.0  11.5   Hematocrit 36.0 - 46.0 % 43.0  40.0  38.2   Platelets 150 - 400 K/uL 349  331  367        Latest Ref Rng & Units 11/18/2021    10:15 AM 08/04/2019   10:54 AM 07/23/2018    5:55 AM  CMP  Glucose 70 - 99 mg/dL 122  108  110   BUN 6 - 20 mg/dL 6  9    Creatinine 0.44 - 1.00 mg/dL 1.05  0.89    Sodium 135 - 145 mmol/L 139  139    Potassium 3.5 - 5.1 mmol/L 3.9  4.2    Chloride 98 - 111 mmol/L 105  107    CO2 22 - 32 mmol/L 28  18    Calcium 8.9 - 10.3 mg/dL 9.4  9.6    Total Protein 6.5 - 8.1 g/dL 7.9     Total Bilirubin 0.3 - 1.2 mg/dL 0.4     Alkaline Phos 38 - 126 U/L 81     AST 15 - 41 U/L 10     ALT 0 - 44 U/L 10       ASSESSMENT & PLAN Christina Morton is a 36 y.o. female presenting to the clinic for iron deficiency anemia.   # Iron deficiency anemia, etiology unknown: --Received IV feraheme x 2 doses on 01/07/2021 and 01/14/2021.  --Continue to incorporate iron rich foods into diet.  --Patient does not have monthly menstrual cycle since IUD placement. --Under the care of Bayley McMichael PA-C from [facility]. Records indicate patient had negative hemoccult and did not appear to have GI etiology. Patient was to undergo additional testing for celiac disease, results not available.  Plan:  --Labs today show anemia has resolved with hemoglobin 12.9, MCV 72.6, platelets of 349, and white blood cell count 4.7.  Iron studies within normal limits. --Okay to hold iron tablets and does not need IV iron infusions.  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 --Iron deficiency has  been corrected.  #Fatigue, persistent --Uncertain etiology as symptoms have persistent even though iron deficiency anemia has resolved.  -- Based on results of last sleep study concern for possible narcolepsy versus idiopathic hypersomnia. --Encouraged patient to follow up with PCP to further evaluate symptoms.   No orders of the defined types were placed in this encounter.   All questions were answered. The patient knows to call the clinic with any problems, questions or  concerns.  I have spent a total of 30 minutes minutes of face-to-face and non-face-to-face time, preparing to see the patient, obtaining and/or reviewing separately obtained history, performing a medically appropriate examination, counseling and educating the patient, documenting clinical information in the electronic health record, and care coordination.   Ledell Peoples, MD Department of Hematology/Oncology [place] at [facility] Phone: [phone] Pager: [phone] Email: [email or web address]

## 2021-11-23 ENCOUNTER — Encounter: Payer: Self-pay | Admitting: Physician Assistant

## 2021-12-05 ENCOUNTER — Telehealth: Payer: Self-pay | Admitting: Physical Therapy

## 2021-12-05 NOTE — Telephone Encounter (Signed)
LVM for pt to schedule eval.

## 2021-12-08 NOTE — Progress Notes (Unsigned)
12/10/21- 59 yoF former smoker with concern of EDS Medical problem list includes DM2, Obesity, Cervical Radiculopathy, Depression, Glaucoma, FE Anemia,  09/18/2021- Initial Eval by Clent Ridges, NP: Patient presents today for new sleep consult.  Patient reports symptoms of insomnia, fallas asleep while driving, restless sleep and exhausted. She had NPSG on 05/17/21 that showed no evidence of sleep apnea, average AHI 0.6/hr with SpO2 low 92%. She falls asleep very easily during the day. She is tired all the time. Her typicl bedtime varies, she try's to go to sleep by 11pm. Sometimes it will take her 1-4 hours to fall asleep. She wakes up on average once a night. She starts her day at 7:30 to help her son get ready for virtual home schooling. She will lay back down around 8am and it might take her until 9:30-10am to fall asleep and she will get an additional 1-2 hours of sleep. She has tried several sleep aids in the past including 5mg  Ambien, 2-4mg  Lunesta, melatonin and trazodone in the past without improvement.  She is currently being weaned off of Lunesta and is on 1 mg at bedtime.  She has used Benadryl in the past with some results however her eye doctor told her not to take this regularly due to her history of glaucoma.  Concern for possible narcolepsy.  No symptoms of cataplexy or sleepwalking. NPSG 05/17/21- AHI 0.6/hr, desaturation to 92%, body weight 250 lbs REM latency 41 minutes, REM 26.9% of TST NPSG 11/12/21- AHI 2.5/ hr, desaturation to 86%, body weight 250 lbs  REM latency 67.5 minutes, REM 27.4% of TST MSLT 11/13/21- Mean Sleep Latency 5.86 minutes, Slept on 5/5 naps, 1 SOREM (pt unsure about recent use of Prozac or Lunesta prior to this NPSG) Usual meds include Lunesta 1 mg, Prozac 10 mg,  Epworth score- Body weight today-256 lbs This married mother describes significant daytime sleepiness since early childhood.  Pattern is stable.  Needs naps but does not find them refreshing.  We discussed safety  responsibility while driving.  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  No similar family history.  Husband tells her she snores.  Not currently taking anything to help her difficulty with sleep onset, saying medications including most recently Lunesta did not help.  Once asleep she sleeps soundly.  1 cup of coffee once or twice a week.  Does not find it very helpful.  Denies pregnancy.  Thyroid function checked. We discussed her sleep studies which show no evidence of significant OSA.  Pathologic sleepiness demonstrated on MSLT not meeting the specific evidence of increased REM pressure usually required for a firm diagnosis of narcolepsy.  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  Prior to Admission medications   Medication Sig Start Date End Date Taking? Authorizing Provider  amphetamine-dextroamphetamine (ADDERALL) 10 MG tablet 1 tab twice daily as needed for alertness 12/10/21  Yes Jehiel Koepp, 12/12/21 D, MD  Cholecalciferol (VITAMIN D3 ADULT GUMMIES) 25 MCG (1000 UT) CHEW 1 tablet   Yes [provider]  eszopiclone (LUNESTA) 1 MG TABS tablet Take 1 tablet (1 mg total) by mouth at bedtime as needed for sleep. Take immediately before bedtime 08/09/19  Yes 08/11/19, Talbert Forest, DO  famotidine (PEPCID) 20 MG tablet Take 1 tablet (20 mg total) by mouth at bedtime. 12/08/18  Yes 12/10/18, Talbert Forest, DO  FLUoxetine (PROZAC) 10 MG capsule 1 capsule   Yes [provider]  glipiZIDE (GLUCOTROL XL) 10 MG  24 hr tablet Take 10 mg by mouth daily. 08/13/20  Yes [provider]  levonorgestrel (MIRENA) 20 MCG/DAY IUD by Intrauterine route.   Yes [provider]  rizatriptan (MAXALT) 10 MG tablet Take 1 tablet earliest onset of migraine.  May repeat in 2 hours if needed.  Maximum 2 tablets in 24 hours 04/08/19  Yes Jaffe, Adam R, DO  rosuvastatin (CRESTOR) 10 MG tablet Take 1 tablet by mouth daily.  11/21/20  Yes [provider]  Semaglutide, 2 MG/DOSE, (OZEMPIC, 2 MG/DOSE,) 8 MG/3ML SOPN Ozempic 2 mg/dose (8 mg/3 mL) subcutaneous pen injector  INJECT 2 MG EVERY WEEK BY SUBCUTANEOUS ROUTE   Yes [provider]  topiramate (TOPAMAX) 50 MG tablet Take 25mg  at hs for one week and then 50mg  at thereforth at hs 08/11/19  Yes , 08/13/19, DO  zolpidem (AMBIEN) 5 MG tablet Take 1 tablet (5 mg total) by mouth at bedtime. 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   Pt had laparoscopic curgery for urachal cyst   Past Surgical History:  Procedure Laterality Date   CESAREAN SECTION     CESAREAN SECTION WITH BILATERAL TUBAL LIGATION Bilateral 07/21/2018   Procedure: CESAREAN SECTION WITH BILATERAL TUBAL LIGATION;  Surgeon: 03/23/2018, MD;  Location: [facility] LD ORS;  Service: Obstetrics;  Laterality: Bilateral;   CYSTECTOMY     Family History  Problem Relation Age of Onset   Hypertrophic cardiomyopathy Mother    Bradycardia Mother    Healthy Father    Breast cancer Maternal Great-grandmother    Social History   Socioeconomic History   Marital status: Single    Spouse name: Not on file   Number of children: 2   Years of education: Not on file   Highest education level: Some college, no degree  Occupational History   Occupation: not working  Tobacco Use   Smoking status: Former    Packs/day: 1.00    Years: 1.00    Total pack years: 1.00    Types: Cigarettes    Quit date: 11/22/2017    Years since quitting: 4.0   Smokeless tobacco: Never  Vaping Use   Vaping Use: Never used  Substance and Sexual Activity   Alcohol  use: Yes    Comment: once a monthly   Drug use: Yes    Types: Marijuana    Comment: occasionally, once a month   Sexual activity: Yes    Birth control/protection: I.U.D.  Other Topics Concern   Not on file  Social History Narrative   Right handed   Two story    No caffeine, maybe coffee once a week   Social Determinants of Health   Financial Resource Strain: Not on file  Food Insecurity: No Food Insecurity (06/21/2018)   Hunger Vital Sign    Worried About Running Out of Food in the Last Year: Never true    Ran Out of Food in the Last Year: Never true  Transportation Needs: No Transportation Needs (06/21/2018)   PRAPARE - Transportation    Lack of Transportation (Medical): No    Lack  of Transportation (Non-Medical): No  Physical Activity: Not on file  Stress: Not on file  Social Connections: Not on file  Intimate Partner Violence: Not At Risk (07/08/2018)   Humiliation, Afraid, Rape, and Kick questionnaire    Fear of Current or Ex-Partner: No    Emotionally Abused: No    Physically Abused: No    Sexually Abused: No   ROS-see HPI   + = positive Constitutional:    weight loss, night sweats, fevers, chills, +fatigue, lassitude. HEENT:    headaches, difficulty swallowing, tooth/dental problems, sore throat,       sneezing, itching, ear ache, nasal congestion, post nasal drip, snoring CV:    chest pain, orthopnea, PND, swelling in lower extremities, anasarca,                                  dizziness, palpitations Resp:   shortness of breath with exertion or at rest.                productive cough,   non-productive cough, coughing up of blood.              change in color of mucus.  wheezing.   Skin:    rash or lesions. GI:  No-   heartburn, indigestion, abdominal pain, nausea, vomiting, diarrhea,                 change in bowel habits, loss of appetite GU: dysuria, change in color of urine, no urgency or frequency.   flank pain. MS:   joint pain, stiffness, decreased range of  motion, back pain. Neuro-     nothing unusual Psych:  change in mood or affect.  depression or anxiety.   memory loss.  OBJ- Physical Exam General- Alert, Oriented, Affect-appropriate, Distress- none acute, +overweight Skin- rash-none, lesions- none, excoriation- none Lymphadenopathy- none Head- atraumatic            Eyes- Gross vision intact, PERRLA, conjunctivae and secretions clear            Ears- Hearing, canals-normal            Nose- Clear, no-Septal dev, mucus, polyps, erosion, perforation             Throat- Mallampati III-IV , mucosa clear , drainage- none, tonsils- atrophic, +teeth Neck- flexible , trachea midline, no stridor , thyroid nl, carotid no bruit Chest - symmetrical excursion , unlabored           Heart/CV- RRR , no murmur , no gallop  , no rub, nl s1 s2                           - JVD- none , edema- none, stasis changes- none, varices- none           Lung- clear to P&A, wheeze- none, cough- none , dullness-none, rub- none           Chest wall-  Abd-  Br/ Gen/ Rectal- Not done, not indicated Extrem- cyanosis- none, clubbing, none, atrophy- none, strength- nl Neuro- grossly intact to observation

## 2021-12-10 ENCOUNTER — Ambulatory Visit (INDEPENDENT_AMBULATORY_CARE_PROVIDER_SITE_OTHER): Payer: Medicaid Other | Admitting: Internal Medicine

## 2021-12-10 ENCOUNTER — Encounter: Payer: Self-pay | Admitting: Internal Medicine

## 2021-12-10 DIAGNOSIS — G4719 Other hypersomnia: Secondary | ICD-10-CM

## 2021-12-10 DIAGNOSIS — F5101 Primary insomnia: Secondary | ICD-10-CM | POA: Diagnosis not present

## 2021-12-10 MED ORDER — AMPHETAMINE-DEXTROAMPHETAMINE 10 MG PO TABS: ORAL_TABLET | ORAL | 0 refills | Status: DC

## 2021-12-10 NOTE — Assessment & Plan Note (Addendum)
Difficulty with nighttime sleep is commonly part of the difficulty maintaining a normal wake/sleep pattern in Narcolepsy. Plan-attention to good sleep habits.  Naps during the day if needed but should be Short.  Maintain regular sleep schedule for bedtime.

## 2021-12-10 NOTE — Assessment & Plan Note (Signed)
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  Sleep schedule is irregular, reflecting need for naps with subsequent difficulty initiating sleep/Insomnia but I do not think inadequate sleep is the explanation. Plan-managing as probable narcolepsy.  Emphasis on good sleep habits.  Short naps if necessary.  Responsibility for safe driving.  Appropriate discussion of controlled medication.  We will try Adderall, anticipating need to adjust dose but starting with 10 mg twice daily.

## 2021-12-10 NOTE — Patient Instructions (Signed)
Script sent to try Adderall 10 mg up to twice daily as needed for alertness. One in the morning, one around lunch time  Please call as needed

## 2021-12-17 ENCOUNTER — Ambulatory Visit: Payer: Medicaid Other | Attending: Internal Medicine

## 2021-12-17 ENCOUNTER — Encounter: Payer: Self-pay | Admitting: Internal Medicine

## 2021-12-24 NOTE — Therapy (Signed)
OUTPATIENT PHYSICAL THERAPY THORACOLUMBAR EVALUATION   Patient Name: Christina Morton MRN: [id number] DOB:08-25-1985, 36 y.o., female Today's Date: 12/25/2021   PT End of Session - 12/25/21 0831     Visit Number 1    Number of Visits 9    Date for PT Re-Evaluation 01/22/22    Authorization Type [employer]    Authorization - Number of Visits 27    Progress Note Due on Visit --    PT Start Time 0840    PT Stop Time 0928    PT Time Calculation (min) 48 min    Activity Tolerance Patient tolerated treatment well    Behavior During Therapy [facility] for tasks assessed/performed             Past Medical History:  Diagnosis Date   Anemia    Depression    Glaucoma    History of cesarean section, classical 01/21/2018   Low transverse with 5 cm extension into muscular layer--was told at [facility] risk of rupture--will treat as prior classical   Mental disorder    Type 2 diabetes mellitus without complications (HCC)    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   Pt had laparoscopic curgery for urachal cyst   Past Surgical History:  Procedure Laterality Date   CESAREAN SECTION     CESAREAN SECTION WITH BILATERAL TUBAL LIGATION Bilateral 07/21/2018   Procedure: CESAREAN SECTION WITH BILATERAL TUBAL LIGATION;  Surgeon: Kathrynn Running, MD;  Location: [facility] LD ORS;  Service: Obstetrics;  Laterality: Bilateral;   CYSTECTOMY     Patient Active Problem List   Diagnosis Date Noted   Excessive daytime sleepiness 09/18/2021   Microcytosis 05/21/2021   Iron deficiency anemia 08/27/2020   Flu-like symptoms 04/06/2020   Healthcare maintenance 11/26/2019   Insomnia 01/23/2019   Cervical radiculopathy 12/08/2018   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   Depression 03/23/2018   Glaucoma 03/23/2018   Family history of hypertrophic cardiomyopathy 01/21/2018   Diabetes mellitus type 2 in obese (HCC) 01/21/2018    PCP: Jamison Oka MD  REFERRING PROVIDER: Jamison Oka MD  REFERRING DIAG: M54.50  (ICD-10-CM) - Low back pain, unspecified  Rationale for Evaluation and Treatment Rehabilitation  THERAPY DIAG:  Other low back pain  Muscle weakness (generalized)  Other abnormalities of gait and mobility  ONSET DATE: "A couple of months ago", insidious  SUBJECTIVE:                                                                                                                                                                                           SUBJECTIVE STATEMENT: Pt reports gradual onset of LBP over the past couple  of months, worsening, denies any injury/trauma. Denies N/T, bowel/bladder, or unintentional weight loss. Denies any LE referral. States she has difficulty walking household and community distances, difficulty performing ADLs. Pt has two children and typically performs majority of household duties, has difficulty w/ cooking, cleaning, laundry, etc. Lives in two story townhome, bed/bath on second floor, difficulty navigating stairs.   PERTINENT HISTORY:  DM2, past C section  PAIN:  Are you having pain: yes, 8/10 Location: BIL low back at belt line How would you describe your pain? Variable, mostly aching Best: 3/10  Worst: 9/10 Aggravating factors: Standing >30sec, walking from car to door of home, going up and down stairs, lying on back Easing factors: positional changes, tylenol, massage chair, self manipulation, lying prone  PRECAUTIONS: None  WEIGHT BEARING RESTRICTIONS No  FALLS:  Has patient fallen in last 6 months? No  LIVING ENVIRONMENT: Lives with: lives with their family Lives in: House/apartment Stairs: Yes, 15 steps to second level to bedroom, rail  Has following equipment at home: None  OCCUPATION: not employed, typically performs household duties  PLOF: Independent  PATIENT GOALS  get back to doing house work and taking care of children   OBJECTIVE:   DIAGNOSTIC FINDINGS:  None per pt  PATIENT SURVEYS:  Modified Oswestry 72%      COGNITION:  Overall cognitive status: Within functional limits for tasks assessed     SENSATION: WFL   POSTURE: rounded shoulders, forward head, increased lumbar lordosis, and increased thoracic kyphosis  PALPATION: TTP BIL QL and lumbar paraspinals, palpable tightness B glutes and piriformis Hypomobile and relieving throughout thoracic spine with PA mobs, hypomobile and mild discomfort PA mobs lumbar spine  LUMBAR ROM:   Active  A/PROM  eval  Flexion ~25%  Extension 75%  Right lateral flexion 50%  Left lateral flexion 50%  Right rotation 25%  Left rotation 25%   (Blank rows = not tested) Comments: Flexion painful, extension less painful, R sidebending painful but similar in ROM to L,   LOWER EXTREMITY MMT:    MMT Right eval Left eval  Hip flexion 4+ 4+  Hip abduction (modified sitting) 5 5  Hip internal rotation    Hip external rotation    Knee flexion 5 5  Knee extension 5    (Blank rows = not tested)  Comments: nonpainful straining in low back with hip flexion    FUNCTIONAL TESTS:  5xSTS: standard chair, 34 sec, no UE  Reduced fwd weight shift, narrow BOS, knee valgus B, rigid posture   GAIT: Distance walked: Within clinic Assistive device utilized: None Level of assistance: Complete Independence Comments: Excessive lumbar lordosis, wide BOS, reduced truncal rotation and arm swing, excessive lateral weightshifting, reduced gait speed    TODAY'S TREATMENT  Performed and reviewed HEP, education as below   PATIENT EDUCATION:  Education details: Pt education on PT impairments, prognosis, and POC. Rationale for interventions, safe/appropriate HEP performance Person educated: Patient Education method: Explanation, Demonstration, Tactile cues, Verbal cues, and Handouts Education comprehension: verbalized understanding, returned demonstration, verbal cues required, tactile cues required, and needs further education    HOME EXERCISE  PROGRAM: Access Code: [email or web address] URL: [email or web address] Date: 12/25/2021 Prepared by: Fransisco Hertz  Exercises - Seated Trunk Rotation - Arms Crossed  - 1 x daily - 7 x weekly - 3 sets - 10 reps - Seated Forward Bending  - 1 x daily - 7 x weekly - 3 sets - 10 reps  ASSESSMENT:  CLINICAL IMPRESSION: Patient is a 36 y.o.  woman who was seen today for physical therapy evaluation and treatment for low back pain. Pt arrives w/ 7-8/10 pain in low back, therapeutic response to STM of B gluteal musculature and B Qls with subsequent reduction in pain to 5/10 on NPS. Tolerates HEP well, reports good relief and reduced stiffness on departure. Pt demonstrates limitations d/t low back pain, reduced spinal/muscular mobility, hip weakness, and impaired kinematics with functional movement patterns; would benefit from skilled PT in order to address these impairments to maximize functional independence and tolerance to typical household activities.   OBJECTIVE IMPAIRMENTS Abnormal gait, decreased activity tolerance, decreased endurance, decreased mobility, difficulty walking, decreased ROM, decreased strength, hypomobility, impaired flexibility, and pain.   ACTIVITY LIMITATIONS carrying, lifting, bending, sitting, standing, squatting, and stairs  PARTICIPATION LIMITATIONS: meal prep, cleaning, laundry, shopping, community activity, and occupation  PERSONAL FACTORS 1 comorbidity: DM  are also affecting patient's functional outcome.   REHAB POTENTIAL: Good  CLINICAL DECISION MAKING: Evolving/moderate complexity  EVALUATION COMPLEXITY: Low   GOALS: Goals reviewed with patient? Yes  SHORT TERM GOALS: Target date: 01/08/2022  Pt will score >62% on ODI to indicate improved perception of function due to low back pain. Baseline: 72% Goal status: INITIAL  2.  Pt will perform 5xSTS in < or = 25 sec to indicate reduced fall risk and improved functional independence. Baseline: 34 sec  standard chair no UE support Goal status: INITIAL     LONG TERM GOALS: Target date: 01/22/2022  Pt will score < 50% on ODI in order to demonstrate improved perception of functional status due to symptoms (MCID = 6 pts) Baseline: 72%% Goal status: INITIAL  2.  Pt will demonstrate 75% lumbar flexion AROM in order to demonstrate improved tolerance to functional movement patterns.   Baseline: 25%, painful Goal status: INITIAL  3. Pt will report ability to tolerate standing >26min with <5/10 pain in order to demonstrate improved tolerance to daily activities such as cooking/cleaning.  Baseline: 30sec per pt report Goal status: INITIAL  4. Pt will perform 5xSTS in <15 sec in order to demonstrate reduced fall risk and improved functional independence. (MCID of 2.3sec) Baseline: 34sec  Goal status: INITIAL    PLAN: PT FREQUENCY: 2x/week  PT DURATION: 4 weeks  PLANNED INTERVENTIONS: Therapeutic exercises, Therapeutic activity, Neuromuscular re-education, Balance training, Gait training, Patient/Family education, Self Care, Joint mobilization, Joint manipulation, Stair training, Spinal manipulation, Spinal mobilization, Cryotherapy, Moist heat, Manual therapy, and Re-evaluation.  PLAN FOR NEXT SESSION: Progress ROM/strengthening exercises as able/appropriate, review HEP.   Check all possible CPT codes: 40347 - PT Re-evaluation, 97110- Therapeutic Exercise, [phone]- Neuro Re-education, [phone] - Gait Training, [phone] - Manual Therapy, 97530 - Therapeutic Activities, [phone] - Self Care, [phone] - Electrical stimulation (unattended), and [id number] - Aquatic therapy     If treatment provided at initial evaluation, no treatment charged due to lack of authorization.        Ashley Murrain PT, DPT 12/25/2021 1:25 PM

## 2021-12-25 ENCOUNTER — Other Ambulatory Visit: Payer: Self-pay

## 2021-12-25 ENCOUNTER — Ambulatory Visit: Payer: Medicaid Other | Attending: Internal Medicine | Admitting: Physical Therapy

## 2021-12-25 ENCOUNTER — Encounter: Payer: Self-pay | Admitting: Physical Therapy

## 2021-12-25 DIAGNOSIS — M5459 Other low back pain: Secondary | ICD-10-CM | POA: Diagnosis present

## 2021-12-25 DIAGNOSIS — R2689 Other abnormalities of gait and mobility: Secondary | ICD-10-CM | POA: Diagnosis present

## 2021-12-25 DIAGNOSIS — M6281 Muscle weakness (generalized): Secondary | ICD-10-CM

## 2021-12-31 ENCOUNTER — Ambulatory Visit: Payer: Medicaid Other | Admitting: Physical Therapy

## 2021-12-31 ENCOUNTER — Telehealth: Payer: Self-pay | Admitting: Physical Therapy

## 2021-12-31 NOTE — Telephone Encounter (Signed)
Called patient re: no show - pt answers, notified of attendance policy. Verbalizes understanding and confirms next appt

## 2022-01-01 NOTE — Therapy (Signed)
OUTPATIENT PHYSICAL THERAPY TREATMENT NOTE   Patient Name: Christina Morton MRN: [id number] DOB:11-08-1985, 36 y.o., female Today's Date: 01/02/2022  PCP: Jamison Oka MD   REFERRING PROVIDER: Jamison Oka MD  END OF SESSION:   PT End of Session - 01/02/22 0838     Visit Number 2    Number of Visits 9    Date for PT Re-Evaluation 01/22/22    Authorization Type [employer]    Authorization - Visit Number 2    Authorization - Number of Visits 27    PT Start Time 0840    PT Stop Time 0924    PT Time Calculation (min) 44 min    Activity Tolerance Patient tolerated treatment well    Behavior During Therapy [facility] for tasks assessed/performed             Past Medical History:  Diagnosis Date   Anemia    Depression    Glaucoma    History of cesarean section, classical 01/21/2018   Low transverse with 5 cm extension into muscular layer--was told at [facility] risk of rupture--will treat as prior classical   Mental disorder    Type 2 diabetes mellitus without complications (HCC)    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   Pt had laparoscopic curgery for urachal cyst   Past Surgical History:  Procedure Laterality Date   CESAREAN SECTION     CESAREAN SECTION WITH BILATERAL TUBAL LIGATION Bilateral 07/21/2018   Procedure: CESAREAN SECTION WITH BILATERAL TUBAL LIGATION;  Surgeon: Kathrynn Running, MD;  Location: [facility] LD ORS;  Service: Obstetrics;  Laterality: Bilateral;   CYSTECTOMY     Patient Active Problem List   Diagnosis Date Noted   Excessive daytime sleepiness 09/18/2021   Microcytosis 05/21/2021   Iron deficiency anemia 08/27/2020   Flu-like symptoms 04/06/2020   Healthcare maintenance 11/26/2019   Insomnia 01/23/2019   Cervical radiculopathy 12/08/2018   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   Depression 03/23/2018   Glaucoma 03/23/2018   Family history of hypertrophic cardiomyopathy 01/21/2018   Diabetes mellitus type 2 in obese (HCC) 01/21/2018    REFERRING  DIAG: M54.50 (ICD-10-CM) - Low back pain, unspecified  THERAPY DIAG:  Other low back pain  Muscle weakness (generalized)  Other abnormalities of gait and mobility  Rationale for Evaluation and Treatment Rehabilitation  PERTINENT HISTORY: DM2, past C section  PRECAUTIONS: none  PAIN: per initial evaluation Are you having pain: yes, 8/10 Location: BIL low back at belt line How would you describe your pain? Variable, mostly aching Best: 3/10  Worst: 9/10 Aggravating factors: Standing >30sec, walking from car to door of home, going up and down stairs, lying on back Easing factors: positional changes, tylenol, massage chair, self manipulation, lying prone   SUBJECTIVE: Pt arrives with 8/10 pain, reports exercises have been getting easier at home and she has gotten relief from self manipulation. No other new changes.      OBJECTIVE: (objective measures completed at initial evaluation unless otherwise dated)   DIAGNOSTIC FINDINGS:  None per pt   PATIENT SURVEYS:  Modified Oswestry 72%        COGNITION:           Overall cognitive status: Within functional limits for tasks assessed                          SENSATION: WFL     POSTURE: rounded shoulders, forward head, increased lumbar lordosis, and increased thoracic kyphosis   PALPATION: TTP  BIL QL and lumbar paraspinals, palpable tightness B glutes and piriformis Hypomobile and relieving throughout thoracic spine with PA mobs, hypomobile and mild discomfort PA mobs lumbar spine   LUMBAR ROM:    Active  A/PROM  eval  Flexion ~25%  Extension 75%  Right lateral flexion 50%  Left lateral flexion 50%  Right rotation 25%  Left rotation 25%   (Blank rows = not tested) Comments: Flexion painful, extension less painful, R sidebending painful but similar in ROM to L,     LOWER EXTREMITY MMT:     MMT Right eval Left eval  Hip flexion 4+ 4+  Hip abduction (modified sitting) 5 5  Hip internal rotation      Hip  external rotation      Knee flexion 5 5  Knee extension 5     (Blank rows = not tested)   Comments: nonpainful straining in low back with hip flexion       FUNCTIONAL TESTS:  5xSTS: standard chair, 34 sec, no UE            Reduced fwd weight shift, narrow BOS, knee valgus B, rigid posture     GAIT: Distance walked: Within clinic Assistive device utilized: None Level of assistance: Complete Independence Comments: Excessive lumbar lordosis, wide BOS, reduced truncal rotation and arm swing, excessive lateral weightshifting, reduced gait speed       TODAY'S TREATMENT  [facility] Adult PT Treatment:                                                DATE: 12/31/2021  Therapeutic Exercise: Swiss ball rollout 3 way, 15x each, cues for form and breath control  Seated trunk rotation, 1x15 BIL, cues for form and HEP review Standing marches 3# ankle weights, UE support, 3x10, cues for form/pacing/posture   Manual Therapy: Grade 2-3 PA mobilizations lumbothoracic spine, prone STM B QL, lumbothoracic paraspinals, gluteal musculature; passive QL stretch B in prone with gentle oscillations at end range for muscle relaxation  Neuromuscular re-ed: Bridge + ball squeeze 3x8, for improved lumbopelvic activation/stability, cues for posture, breath control, and improved core activation Hooklying physioball press downs 2x15, cues for breath control, abdominal bracing for improved activation, setup      PATIENT EDUCATION:  Education details: reviewed HEP, rationale for interventions, discussed HEP performance and modification as needed Person educated: Patient Education method: Explanation, Demonstration, Tactile cues, Verbal cues Education comprehension: verbalized understanding, returned demonstration, verbal cues required, tactile cues required, and needs further education      HOME EXERCISE PROGRAM: Access Code: [email or web address] URL: [email or web address] Date: 12/25/2021 Prepared by:  Fransisco Hertz   Exercises - Seated Trunk Rotation - Arms Crossed  - 1 x daily - 7 x weekly - 3 sets - 10 reps - Seated Forward Bending  - 1 x daily - 7 x weekly - 3 sets - 10 reps   ASSESSMENT:   CLINICAL IMPRESSION: Pt is a 36 y.o woman arriving to PT on this date for PT treatment of low back pain. Tolerates session well with no adverse events. Initiated with manual therapy as described above, excellent relief reported with reduction in pain from 8/10 to 4/10 on NPS. Able to progress for increased volume of thoracolumbar mobility exercises, as well as addition of lumbopelvic activation/stability exercises as above. Cues as needed throughout for appropriate form/pacing. Tolerates session well,  departs with 5/10 pain on NPS and subjective report of reduced stiffness. Continues to be limited by pain, muscular fatigue, and reduced lumbar mobility. Recommend continued skilled PT to address aforementioned deficits to maximize functional independence and tolerance. Pt departs today's session in no acute distress, all voiced questions/concerns addressed appropriately from PT perspective.        OBJECTIVE IMPAIRMENTS Abnormal gait, decreased activity tolerance, decreased endurance, decreased mobility, difficulty walking, decreased ROM, decreased strength, hypomobility, impaired flexibility, and pain.    ACTIVITY LIMITATIONS carrying, lifting, bending, sitting, standing, squatting, and stairs   PARTICIPATION LIMITATIONS: meal prep, cleaning, laundry, shopping, community activity, and occupation   PERSONAL FACTORS 1 comorbidity: DM  are also affecting patient's functional outcome.    REHAB POTENTIAL: Good   CLINICAL DECISION MAKING: Evolving/moderate complexity   EVALUATION COMPLEXITY: Low     GOALS: Goals reviewed with patient? Yes   SHORT TERM GOALS: Target date: 01/08/2022   Pt will score >62% on ODI to indicate improved perception of function due to low back pain. Baseline: 72% Goal  status: INITIAL   2.  Pt will perform 5xSTS in < or = 25 sec to indicate reduced fall risk and improved functional independence. Baseline: 34 sec standard chair no UE support Goal status: INITIAL         LONG TERM GOALS: Target date: 01/22/2022   Pt will score < 50% on ODI in order to demonstrate improved perception of functional status due to symptoms (MCID = 6 pts) Baseline: 72%% Goal status: INITIAL   2.  Pt will demonstrate 75% lumbar flexion AROM in order to demonstrate improved tolerance to functional movement patterns.    Baseline: 25%, painful Goal status: INITIAL   3. Pt will report ability to tolerate standing >46min with <5/10 pain in order to demonstrate improved tolerance to daily activities such as cooking/cleaning.  Baseline: 30sec per pt report Goal status: INITIAL   4. Pt will perform 5xSTS in <15 sec in order to demonstrate reduced fall risk and improved functional independence. (MCID of 2.3sec) Baseline: 34sec            Goal status: INITIAL      PLAN: PT FREQUENCY: 2x/week   PT DURATION: 4 weeks   PLANNED INTERVENTIONS: Therapeutic exercises, Therapeutic activity, Neuromuscular re-education, Balance training, Gait training, Patient/Family education, Self Care, Joint mobilization, Joint manipulation, Stair training, Spinal manipulation, Spinal mobilization, Cryotherapy, Moist heat, Manual therapy, and Re-evaluation.   PLAN FOR NEXT SESSION: Progress ROM/strengthening exercises as able/appropriate, review/modify HEP. Reduce time spent w/ manual as able     Ashley Murrain PT, DPT 01/02/2022 9:29 AM

## 2022-01-02 ENCOUNTER — Ambulatory Visit: Payer: Medicaid Other | Admitting: Physical Therapy

## 2022-01-02 ENCOUNTER — Encounter: Payer: Self-pay | Admitting: Physical Therapy

## 2022-01-02 DIAGNOSIS — M6281 Muscle weakness (generalized): Secondary | ICD-10-CM

## 2022-01-02 DIAGNOSIS — M5459 Other low back pain: Secondary | ICD-10-CM

## 2022-01-02 DIAGNOSIS — R2689 Other abnormalities of gait and mobility: Secondary | ICD-10-CM

## 2022-01-13 NOTE — Progress Notes (Deleted)
12/10/21- 55 yoF former smoker with concern of EDS Medical problem list includes DM2, Obesity, Cervical Radiculopathy, Depression, Glaucoma, FE Anemia,  09/18/2021- Initial Eval by Clent Ridges, NP: Patient presents today for new sleep consult.  Patient reports symptoms of insomnia, fallas asleep while driving, restless sleep and exhausted. She had NPSG on 05/17/21 that showed no evidence of sleep apnea, average AHI 0.6/hr with SpO2 low 92%. She falls asleep very easily during the day. She is tired all the time. Her typicl bedtime varies, she try's to go to sleep by 11pm. Sometimes it will take her 1-4 hours to fall asleep. She wakes up on average once a night. She starts her day at 7:30 to help her son get ready for virtual home schooling. She will lay back down around 8am and it might take her until 9:30-10am to fall asleep and she will get an additional 1-2 hours of sleep. She has tried several sleep aids in the past including 5mg  Ambien, 2-4mg  Lunesta, melatonin and trazodone in the past without improvement.  She is currently being weaned off of Lunesta and is on 1 mg at bedtime.  She has used Benadryl in the past with some results however her eye doctor told her not to take this regularly due to her history of glaucoma.  Concern for possible narcolepsy.  No symptoms of cataplexy or sleepwalking. NPSG 05/17/21- AHI 0.6/hr, desaturation to 92%, body weight 250 lbs REM latency 41 minutes, REM 26.9% of TST NPSG 11/12/21- AHI 2.5/ hr, desaturation to 86%, body weight 250 lbs  REM latency 67.5 minutes, REM 27.4% of TST MSLT 11/13/21- Mean Sleep Latency 5.86 minutes, Slept on 5/5 naps, 1 SOREM (pt unsure about recent use of Prozac or Lunesta prior to this NPSG) Usual meds include Lunesta 1 mg, Prozac 10 mg,  Epworth score- Body weight today-256 lbs This married mother describes significant daytime sleepiness since early childhood.  Pattern is stable.  Needs naps but does not find them refreshing.  We discussed safety  responsibility while driving.  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  No similar family history.  Husband tells her she snores.  Not currently taking anything to help her difficulty with sleep onset, saying medications including most recently Lunesta did not help.  Once asleep she sleeps soundly.  1 cup of coffee once or twice a week.  Does not find it very helpful.  Denies pregnancy.  Thyroid function checked. We discussed her sleep studies which show no evidence of significant OSA.  Pathologic sleepiness demonstrated on MSLT not meeting the specific evidence of increased REM pressure usually required for a firm diagnosis of narcolepsy.  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  01/14/22- 36 yoF former smoker followed for EDS, Narcolepsy without cataplexy, complicated by  DM2, Obesity, Cervical Radiculopathy, Depression, Glaucoma, FE Anemia,  -Lunesta 1mg , Adderall 10 mg bid, Prozac,  Body weight today- Covid vax- Flu vax-   ROS-see HPI   + = positive Constitutional:    weight loss, night sweats, fevers, chills, +fatigue, lassitude. HEENT:    headaches, difficulty swallowing, tooth/dental problems, sore throat,       sneezing, itching, ear ache, nasal congestion, post nasal drip, snoring CV:    chest pain, orthopnea, PND, swelling in lower extremities, anasarca,  dizziness, palpitations Resp:   shortness of breath with exertion or at rest.                productive cough,   non-productive cough, coughing up of blood.              change in color of mucus.  wheezing.   Skin:    rash or lesions. GI:  No-   heartburn, indigestion, abdominal pain, nausea, vomiting, diarrhea,                 change in bowel habits, loss of appetite GU: dysuria, change in color of urine, no urgency or frequency.   flank pain. MS:   joint pain, stiffness, decreased range of motion, back  pain. Neuro-     nothing unusual Psych:  change in mood or affect.  depression or anxiety.   memory loss.  OBJ- Physical Exam General- Alert, Oriented, Affect-appropriate, Distress- none acute, +overweight Skin- rash-none, lesions- none, excoriation- none Lymphadenopathy- none Head- atraumatic            Eyes- Gross vision intact, PERRLA, conjunctivae and secretions clear            Ears- Hearing, canals-normal            Nose- Clear, no-Septal dev, mucus, polyps, erosion, perforation             Throat- Mallampati III-IV , mucosa clear , drainage- none, tonsils- atrophic, +teeth Neck- flexible , trachea midline, no stridor , thyroid nl, carotid no bruit Chest - symmetrical excursion , unlabored           Heart/CV- RRR , no murmur , no gallop  , no rub, nl s1 s2                           - JVD- none , edema- none, stasis changes- none, varices- none           Lung- clear to P&A, wheeze- none, cough- none , dullness-none, rub- none           Chest wall-  Abd-  Br/ Gen/ Rectal- Not done, not indicated Extrem- cyanosis- none, clubbing, none, atrophy- none, strength- nl Neuro- grossly intact to observation

## 2022-01-14 ENCOUNTER — Ambulatory Visit: Payer: Medicaid Other | Admitting: Physical Therapy

## 2022-01-14 ENCOUNTER — Ambulatory Visit: Payer: Medicaid Other | Admitting: Internal Medicine

## 2022-01-15 NOTE — Therapy (Signed)
OUTPATIENT PHYSICAL THERAPY TREATMENT NOTE   Patient Name: Christina Morton MRN: [id number] DOB:June 21, 1985, 36 y.o., female Today's Date: 01/16/2022  PCP: Latanya Presser MD   REFERRING PROVIDER: Latanya Presser MD  END OF SESSION:   PT End of Session - 01/16/22 0834     Visit Number 3    Number of Visits 9    Date for PT Re-Evaluation 01/22/22    Authorization Type [employer]    Authorization - Visit Number 3    Authorization - Number of Visits 27    PT Start Time 0835    PT Stop Time 0921    PT Time Calculation (min) 46 min    Activity Tolerance Patient tolerated treatment well    Behavior During Therapy [facility] for tasks assessed/performed              Past Medical History:  Diagnosis Date   Anemia    Depression    Glaucoma    History of cesarean section, classical 01/21/2018   Low transverse with 5 cm extension into muscular layer--was told at [facility] risk of rupture--will treat as prior classical   Mental disorder    Type 2 diabetes mellitus without complications ([place])    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   Pt had laparoscopic curgery for urachal cyst   Past Surgical History:  Procedure Laterality Date   CESAREAN SECTION     CESAREAN SECTION WITH BILATERAL TUBAL LIGATION Bilateral 07/21/2018   Procedure: CESAREAN SECTION WITH BILATERAL TUBAL LIGATION;  Surgeon: Gwynne Edinger, MD;  Location: [facility] LD ORS;  Service: Obstetrics;  Laterality: Bilateral;   CYSTECTOMY     Patient Active Problem List   Diagnosis Date Noted   Excessive daytime sleepiness 09/18/2021   Microcytosis 05/21/2021   Iron deficiency anemia 08/27/2020   Flu-like symptoms 04/06/2020   Healthcare maintenance 11/26/2019   Insomnia 01/23/2019   Cervical radiculopathy 12/08/2018   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   Depression 03/23/2018   Glaucoma 03/23/2018   Family history of hypertrophic cardiomyopathy 01/21/2018   Diabetes mellitus type 2 in obese ([place]) 01/21/2018    REFERRING  DIAG: M54.50 (ICD-10-CM) - Low back pain, unspecified  THERAPY DIAG:  Other low back pain  Muscle weakness (generalized)  Other abnormalities of gait and mobility  Rationale for Evaluation and Treatment Rehabilitation  PERTINENT HISTORY: DM2, past C section  PRECAUTIONS: none  SUBJECTIVE: Pt arrives with no pain, states she has been doing very well since last visit and is doing well with HEP. Pt states she has been busy and  No other new updates.    PAIN: Are you having pain: none at present, 0/10 Location: BIL low back at belt line How would you describe your pain? Variable, mostly aching Best in past week: 0/10  Worst in past week: 4-5/10 Aggravating factors: Standing >30sec, walking from car to door of home, going up and down stairs, lying on back Easing factors: positional changes, tylenol, massage chair, self manipulation, lying prone    OBJECTIVE: (objective measures completed at initial evaluation unless otherwise dated)   DIAGNOSTIC FINDINGS:  None per pt   PATIENT SURVEYS:  Modified Oswestry 72%        COGNITION:           Overall cognitive status: Within functional limits for tasks assessed                          SENSATION: WFL     POSTURE: rounded shoulders,  forward head, increased lumbar lordosis, and increased thoracic kyphosis   PALPATION: TTP BIL QL and lumbar paraspinals, palpable tightness B glutes and piriformis Hypomobile and relieving throughout thoracic spine with PA mobs, hypomobile and mild discomfort PA mobs lumbar spine   LUMBAR ROM:    Active  A/PROM  eval 01/16/22  Flexion ~25%   Extension 75%   Right lateral flexion 50%   Left lateral flexion 50%   Right rotation 25%   Left rotation 25%    (Blank rows = not tested) Comments: Flexion painful, extension less painful, R sidebending painful but similar in ROM to L,     LOWER EXTREMITY MMT:     MMT Right eval Left eval  Hip flexion 4+ 4+  Hip abduction (modified sitting)  5 5  Hip internal rotation      Hip external rotation      Knee flexion 5 5  Knee extension 5     (Blank rows = not tested)   Comments: nonpainful straining in low back with hip flexion       FUNCTIONAL TESTS:  5xSTS: standard chair, 34 sec, no UE            Reduced fwd weight shift, narrow BOS, knee valgus B, rigid posture 01/16/22: 5xSTS  22 sec standard chair no UE     GAIT: Distance walked: Within clinic Assistive device utilized: None Level of assistance: Complete Independence Comments: Excessive lumbar lordosis, wide BOS, reduced truncal rotation and arm swing, excessive lateral weightshifting, reduced gait speed       TODAY'S TREATMENT  [facility] Adult PT Treatment:                                                DATE: 01/16/22 Therapeutic Exercise: Swiss ball rollout 3 way 12x each LTR x12 B Standing marches 5# ankle weights, UE support, 3x8 STS from raised surface 3x5 with 3kg ball (in addition to 5xSTS BW) 5#KB deadlift from 14inch height, 2x12 cues for hip hinge and pacing, BOS Neuromuscular re-ed: Standing physioball press downs 2x12, fwd flex only for improved core activation, cues for breath control Bridge + ball squeeze 3x8 for improved lumbopelvic stability   [facility] Adult PT Treatment:                                                DATE: 12/31/2021  Therapeutic Exercise: Swiss ball rollout 3 way, 15x each, cues for form and breath control  Seated trunk rotation, 1x15 BIL, cues for form and HEP review Standing marches 3# ankle weights, UE support, 3x10, cues for form/pacing/posture Manual Therapy: Grade 2-3 PA mobilizations lumbothoracic spine, prone STM B QL, lumbothoracic paraspinals, gluteal musculature; passive QL stretch B in prone with gentle oscillations at end range for muscle relaxation Neuromuscular re-ed: Bridge + ball squeeze 3x8, for improved lumbopelvic activation/stability, cues for posture, breath control, and improved core activation Hooklying  physioball press downs 2x15, cues for breath control, abdominal bracing for improved activation, setup      PATIENT EDUCATION:  Education details: rationale for interventions, progress with therapy thus far, possible d/c vs extending POC next visit Person educated: Patient Education method: Explanation, Demonstration, Tactile cues, Verbal cues Education comprehension: verbalized understanding, returned  demonstration, verbal cues required, tactile cues required, and needs further education      HOME EXERCISE PROGRAM: Access Code: [email or web address] URL: [email or web address] Date: 12/25/2021 Prepared by: Enis Slipper   Exercises - Seated Trunk Rotation - Arms Crossed  - 1 x daily - 7 x weekly - 3 sets - 10 reps - Seated Forward Bending  - 1 x daily - 7 x weekly - 3 sets - 10 reps   ASSESSMENT:   CLINICAL IMPRESSION: Pt is a 36 y.o woman arriving to PT on this date for PT treatment of low back pain. Today's session initiated with mobility exercises and followed with core stability/activation exercises, today omitted given report of no pain on arrival. Able to increase resistance for functional strengthening and reduce rest breaks, pt denies any increase in pain as session progresses. Measured 5xSTS, improved to 22sec compared to 34 sec on initial evaluation. Pt continues to demonstrate limitations in muscular strength/endurance, mobility, and pain levels. Departs with report of no pain, states exercise felt better today compared to previous sessions.   Recommend skilled PT to address aforementioned deficits to maximize functional independence and tolerance. Pt departs today's session in no acute distress, all voiced questions/concerns addressed appropriately from PT perspective.         OBJECTIVE IMPAIRMENTS Abnormal gait, decreased activity tolerance, decreased endurance, decreased mobility, difficulty walking, decreased ROM, decreased strength, hypomobility, impaired flexibility,  and pain.    ACTIVITY LIMITATIONS carrying, lifting, bending, sitting, standing, squatting, and stairs   PARTICIPATION LIMITATIONS: meal prep, cleaning, laundry, shopping, community activity, and occupation   PERSONAL FACTORS 1 comorbidity: DM  are also affecting patient's functional outcome.    REHAB POTENTIAL: Good   CLINICAL DECISION MAKING: Evolving/moderate complexity   EVALUATION COMPLEXITY: Low     GOALS: Goals reviewed with patient? Yes   SHORT TERM GOALS: Target date: 01/08/2022   Pt will score >62% on ODI to indicate improved perception of function due to low back pain. Baseline: 72% Goal status: INITIAL   2.  Pt will perform 5xSTS in < or = 25 sec to indicate reduced fall risk and improved functional independence. Baseline: 34 sec standard chair no UE support 01/16/22: 22 sec Goal status: MET         LONG TERM GOALS: Target date: 01/22/2022   Pt will score < 50% on ODI in order to demonstrate improved perception of functional status due to symptoms (MCID = 6 pts) Baseline: 72%% Goal status: INITIAL   2.  Pt will demonstrate 75% lumbar flexion AROM in order to demonstrate improved tolerance to functional movement patterns.    Baseline: 25%, painful Goal status: INITIAL   3. Pt will report ability to tolerate standing >52mn with <5/10 pain in order to demonstrate improved tolerance to daily activities such as cooking/cleaning.  Baseline: 30sec per pt report 01/16/22: Pt states last night she did dishes/laundry (took about an hour to an hour and a half) with pain up to 5/10 Goal status: MET   4. Pt will perform 5xSTS in <15 sec in order to demonstrate reduced fall risk and improved functional independence. (MCID of 2.3sec) Baseline: 34sec 01/16/22: 22sec            Goal status: INITIAL      PLAN: PT FREQUENCY: 2x/week   PT DURATION: 4 weeks   PLANNED INTERVENTIONS: Therapeutic exercises, Therapeutic activity, Neuromuscular re-education, Balance training,  Gait training, Patient/Family education, Self Care, Joint mobilization, Joint manipulation, Stair training, Spinal manipulation, Spinal mobilization, Cryotherapy, Moist heat,  Manual therapy, and Re-evaluation.   PLAN FOR NEXT SESSION: Update HEP, consider discharge vs extending POC based ODI/objective measures and pt preference    Leeroy Cha PT, DPT 01/16/2022 9:22 AM

## 2022-01-16 ENCOUNTER — Ambulatory Visit: Payer: Medicaid Other | Admitting: Physical Therapy

## 2022-01-16 ENCOUNTER — Encounter: Payer: Self-pay | Admitting: Physical Therapy

## 2022-01-16 DIAGNOSIS — R2689 Other abnormalities of gait and mobility: Secondary | ICD-10-CM

## 2022-01-16 DIAGNOSIS — M5459 Other low back pain: Secondary | ICD-10-CM | POA: Diagnosis not present

## 2022-01-16 DIAGNOSIS — M6281 Muscle weakness (generalized): Secondary | ICD-10-CM

## 2022-01-20 NOTE — Therapy (Signed)
OUTPATIENT PHYSICAL THERAPY TREATMENT NOTE   Patient Name: Christina Morton MRN: [id number] DOB:28-Feb-1986, 36 y.o., female Today's Date: 01/21/2022  PCP: Latanya Presser MD   REFERRING PROVIDER: Latanya Presser MD  END OF SESSION:   PT End of Session - 01/21/22 0833     Visit Number 4    Number of Visits 9    Date for PT Re-Evaluation 02/21/22    Authorization Type M[employer]    Authorization - Visit Number 4    Authorization - Number of Visits 27    PT Start Time 0835    PT Stop Time 0918    PT Time Calculation (min) 43 min    Activity Tolerance Patient tolerated treatment well    Behavior During Therapy [facility] for tasks assessed/performed               Past Medical History:  Diagnosis Date   Anemia    Depression    Glaucoma    History of cesarean section, classical 01/21/2018   Low transverse with 5 cm extension into muscular layer--was told at [facility] risk of rupture--will treat as prior classical   Mental disorder    Type 2 diabetes mellitus without complications ([place])    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   Pt had laparoscopic curgery for urachal cyst   Past Surgical History:  Procedure Laterality Date   CESAREAN SECTION     CESAREAN SECTION WITH BILATERAL TUBAL LIGATION Bilateral 07/21/2018   Procedure: CESAREAN SECTION WITH BILATERAL TUBAL LIGATION;  Surgeon: Gwynne Edinger, MD;  Location: [facility] LD ORS;  Service: Obstetrics;  Laterality: Bilateral;   CYSTECTOMY     Patient Active Problem List   Diagnosis Date Noted   Excessive daytime sleepiness 09/18/2021   Microcytosis 05/21/2021   Iron deficiency anemia 08/27/2020   Flu-like symptoms 04/06/2020   Healthcare maintenance 11/26/2019   Insomnia 01/23/2019   Cervical radiculopathy 12/08/2018   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   Depression 03/23/2018   Glaucoma 03/23/2018   Family history of hypertrophic cardiomyopathy 01/21/2018   Diabetes mellitus type 2 in obese ([place]) 01/21/2018    REFERRING  DIAG: M54.50 (ICD-10-CM) - Low back pain, unspecified  THERAPY DIAG:  Other low back pain  Muscle weakness (generalized)  Other abnormalities of gait and mobility  Rationale for Evaluation and Treatment Rehabilitation  PERTINENT HISTORY: DM2, past C section  PRECAUTIONS: none  SUBJECTIVE:  Pt arrives with little to no pain, states she is fatigued this morning but pain has still been better than it was. States she does feel she could benefit from more therapy. Otherwise no new updates.    PAIN: Are you having pain: none at present, 0/10 Location: BIL low back at belt line How would you describe your pain? Variable, mostly aching Best in past week: 0/10  Worst in past week: 4-5/10 Aggravating factors: Standing >30sec, walking from car to door of home, going up and down stairs, lying on back Easing factors: positional changes, tylenol, massage chair, self manipulation, lying prone    OBJECTIVE: (objective measures completed at initial evaluation unless otherwise dated)   DIAGNOSTIC FINDINGS:  None per pt   PATIENT SURVEYS:  Modified Oswestry 72%  01/21/22" 58%       COGNITION:           Overall cognitive status: Within functional limits for tasks assessed                          SENSATION: [facility]  POSTURE: rounded shoulders, forward head, increased lumbar lordosis, and increased thoracic kyphosis   PALPATION: TTP BIL QL and lumbar paraspinals, palpable tightness B glutes and piriformis Hypomobile and relieving throughout thoracic spine with PA mobs, hypomobile and mild discomfort PA mobs lumbar spine   LUMBAR ROM:    Active  A/PROM  eval 01/21/22  Flexion ~25% 75% relieving  Extension 75% 100%  Right lateral flexion 50% 100%  Left lateral flexion 50% 100% mild pull   Right rotation 25% 75%  Left rotation 25% 75%   (Blank rows = not tested) Comments: Flexion painful, extension less painful, R sidebending painful but similar in ROM to L,     LOWER  EXTREMITY MMT:     MMT Right eval Left eval R/L 01/21/22  Hip flexion 4+ 4+ 5/5 no strain  Hip abduction (modified sitting) 5 5   Hip internal rotation       Hip external rotation       Knee flexion 5 5   Knee extension 5      (Blank rows = not tested)   Comments: nonpainful straining in low back with hip flexion       FUNCTIONAL TESTS:  5xSTS: standard chair, 34 sec, no UE            Reduced fwd weight shift, narrow BOS, knee valgus B, rigid posture 01/16/22: 5xSTS  22 sec standard chair no UE 01/21/22: 5xSTS 23 sec standard chair no UE     GAIT: Distance walked: Within clinic Assistive device utilized: None Level of assistance: Complete Independence Comments: Excessive lumbar lordosis, wide BOS, reduced truncal rotation and arm swing, excessive lateral weightshifting, reduced gait speed       TODAY'S TREATMENT  [facility] Adult PT Treatment:                                                DATE: 01/21/22 Therapeutic Exercise: Swiss ball rollout 3 way 12x each Open books x8 B STS from raised surface 3x6 with 5kg ball (in addition to 5xSTS BW) 10#KB deadlift from 12inch height, 2x8 cues for hip hinge and pacing, BOS Neuromuscular re-ed: Standing physioball press downs x12, fwd and lateral B for improved core activation, cues for breath control Bridge + ball squeeze 2x12 for improved lumbopelvic stability   [facility] Adult PT Treatment:                                                DATE: 01/16/22 Therapeutic Exercise: Swiss ball rollout 3 way 12x each LTR x12 B Standing marches 5# ankle weights, UE support, 3x8 STS from raised surface 3x5 with 3kg ball (in addition to 5xSTS BW) 5#KB deadlift from 14inch height, 2x12 cues for hip hinge and pacing, BOS Neuromuscular re-ed: Standing physioball press downs 2x12, fwd flex only for improved core activation, cues for breath control Bridge + ball squeeze 3x8 for improved lumbopelvic stability       PATIENT EDUCATION:  Education  details: HEP review, progress with PT thus far, rationale for interventions rationale for interventions Person educated: Patient Education method: Explanation, Demonstration, Tactile cues, Verbal cues Education comprehension: verbalized understanding, returned demonstration, verbal cues required, tactile cues required, and needs further education  HOME EXERCISE PROGRAM: Access Code: [email or web address] URL: [email or web address] Date: 12/25/2021 Prepared by: Enis Slipper   Exercises - Seated Trunk Rotation - Arms Crossed  - 1 x daily - 7 x weekly - 3 sets - 10 reps - Seated Forward Bending  - 1 x daily - 7 x weekly - 3 sets - 10 reps   ASSESSMENT:   CLINICAL IMPRESSION: Pt is a 36 y.o woman arriving to PT on this date for PT treatment of low back pain. Pt states pain continues to be better although she feels she could benefit from more PT. Modified ODI scored at 58% on this date which is improved compared to 72% on initial evaluation and reflects improved perception of disability due to symptoms, although continues to demonstrate impaired perception of function. Objective measures as noted above, overall good improvements in lumbar mobility and hip strength. 5xSTS scored at 23sec which is comparable to last visit but improved compared to initial eval. Given improvements in many domains although continued perception of dysfunction due to pain, recommend extending PT POC for 1-2x/week for 4 more weeks in order to maximize functional gains. Pt tolerates today's session well with progression of volume for familiar program and increased emphasis on core stability.    Recommend skilled PT to address aforementioned deficits to maximize functional independence and tolerance. Pt departs today's session in no acute distress, all voiced questions/concerns addressed appropriately from PT perspective.         OBJECTIVE IMPAIRMENTS Abnormal gait, decreased activity tolerance, decreased endurance,  decreased mobility, difficulty walking, decreased ROM, decreased strength, hypomobility, impaired flexibility, and pain.    ACTIVITY LIMITATIONS carrying, lifting, bending, sitting, standing, squatting, and stairs   PARTICIPATION LIMITATIONS: meal prep, cleaning, laundry, shopping, community activity, and occupation   PERSONAL FACTORS 1 comorbidity: DM  are also affecting patient's functional outcome.    REHAB POTENTIAL: Good   CLINICAL DECISION MAKING: Evolving/moderate complexity   EVALUATION COMPLEXITY: Low     GOALS: Goals reviewed with patient? Yes   SHORT TERM GOALS: Target date: 01/08/2022   Pt will score >62% on ODI to indicate improved perception of function due to low back pain. Baseline: 72% 01/21/22: 58% Goal status: MET   2.  Pt will perform 5xSTS in < or = 25 sec to indicate reduced fall risk and improved functional independence. Baseline: 34 sec standard chair no UE support 01/16/22: 22 sec Goal status: MET         LONG TERM GOALS: Target date: 01/22/2022 (extend to 02/21/22 on 01/21/22)   Pt will score < 50% on ODI in order to demonstrate improved perception of functional status due to symptoms (MCID = 6 pts) Baseline: 72%% 01/21/22: 58% Goal status: progressing   2.  Pt will demonstrate 75% lumbar flexion AROM in order to demonstrate improved tolerance to functional movement patterns.    Baseline: 25%, painful 01/21/22: 75% relieving Goal status: MET   3. Pt will report ability to tolerate standing >21mn with <5/10 pain in order to demonstrate improved tolerance to daily activities such as cooking/cleaning.  Baseline: 30sec per pt report 01/16/22: Pt states last night she did dishes/laundry (took about an hour to an hour and a half) with pain up to 5/10 Goal status: MET   4. Pt will perform 5xSTS in <15 sec in order to demonstrate reduced fall risk and improved functional independence. (MCID of 2.3sec) Baseline: 34sec 01/16/22: 22sec            Goal  status: PROGRESSING  PLAN: PT FREQUENCY: 1-2x/week   PT DURATION: 4 weeks   PLANNED INTERVENTIONS: Therapeutic exercises, Therapeutic activity, Neuromuscular re-education, Balance training, Gait training, Patient/Family education, Self Care, Joint mobilization, Joint manipulation, Stair training, Spinal manipulation, Spinal mobilization, Cryotherapy, Moist heat, Manual therapy, and Re-evaluation.   PLAN FOR NEXT SESSION: progress strengthening as able/appropriate    Leeroy Cha PT, DPT 01/21/2022 9:19 AM

## 2022-01-21 ENCOUNTER — Ambulatory Visit: Payer: Medicaid Other | Attending: Internal Medicine | Admitting: Physical Therapy

## 2022-01-21 ENCOUNTER — Encounter: Payer: Self-pay | Admitting: Physical Therapy

## 2022-01-21 DIAGNOSIS — M6281 Muscle weakness (generalized): Secondary | ICD-10-CM | POA: Diagnosis present

## 2022-01-21 DIAGNOSIS — R2689 Other abnormalities of gait and mobility: Secondary | ICD-10-CM | POA: Insufficient documentation

## 2022-01-21 DIAGNOSIS — M5459 Other low back pain: Secondary | ICD-10-CM | POA: Diagnosis present

## 2022-01-23 ENCOUNTER — Ambulatory Visit: Payer: Medicaid Other | Admitting: Physical Therapy

## 2022-01-23 NOTE — Progress Notes (Signed)
12/10/21- 32 yoF former smoker with concern of EDS Medical problem list includes DM2, Obesity, Cervical Radiculopathy, Depression, Glaucoma, FE Anemia,  09/18/2021- Initial Eval by Clent Ridges, NP: Patient presents today for new sleep consult.  Patient reports symptoms of insomnia, fallas asleep while driving, restless sleep and exhausted. She had NPSG on 05/17/21 that showed no evidence of sleep apnea, average AHI 0.6/hr with SpO2 low 92%. She falls asleep very easily during the day. She is tired all the time. Her typicl bedtime varies, she try's to go to sleep by 11pm. Sometimes it will take her 1-4 hours to fall asleep. She wakes up on average once a night. She starts her day at 7:30 to help her son get ready for virtual home schooling. She will lay back down around 8am and it might take her until 9:30-10am to fall asleep and she will get an additional 1-2 hours of sleep. She has tried several sleep aids in the past including 5mg  Ambien, 2-4mg  Lunesta, melatonin and trazodone in the past without improvement.  She is currently being weaned off of Lunesta and is on 1 mg at bedtime.  She has used Benadryl in the past with some results however her eye doctor told her not to take this regularly due to her history of glaucoma.  Concern for possible narcolepsy.  No symptoms of cataplexy or sleepwalking. NPSG 05/17/21- AHI 0.6/hr, desaturation to 92%, body weight 250 lbs REM latency 41 minutes, REM 26.9% of TST NPSG 11/12/21- AHI 2.5/ hr, desaturation to 86%, body weight 250 lbs  REM latency 67.5 minutes, REM 27.4% of TST MSLT 11/13/21- Mean Sleep Latency 5.86 minutes, Slept on 5/5 naps, 1 SOREM (pt unsure about recent use of Prozac or Lunesta prior to this NPSG) Usual meds include Lunesta 1 mg, Prozac 10 mg,  Epworth score- Body weight today-256 lbs This married mother describes significant daytime sleepiness since early childhood.  Pattern is stable.  Needs naps but does not find them refreshing.  We discussed safety  responsibility while driving.  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  No similar family history.  Husband tells her she snores.  Not currently taking anything to help her difficulty with sleep onset, saying medications including most recently Lunesta did not help.  Once asleep she sleeps soundly.  1 cup of coffee once or twice a week.  Does not find it very helpful.  Denies pregnancy.  Thyroid function checked. We discussed her sleep studies which show no evidence of significant OSA.  Pathologic sleepiness demonstrated on MSLT not meeting the specific evidence of increased REM pressure usually required for a firm diagnosis of narcolepsy.  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  01/24/22- 36 yoF former smoker followed for Narcolepsy, complicated by Obesity, Cervical Radiculopathy, Depression, Glaucoma, FE Anemia -Lunesta 1 mg, Adderall 10 bid,  Body weight today- Covid vax-no Flu vax-declines Unfortunately she recognizes no improvement in oppressive sleepiness taking Adderall 10 mg twice daily.  There have been no other changes or new events.  We again emphasized her responsibility to drive safely or not drive.  We are going to try higher dose Adderall. While I was speaking to her she was awake and interactive. While I was typing she would get drowsy without direct stimulation.  ROS-see HPI   + = positive Constitutional:    weight loss, night sweats, fevers, chills, +fatigue, lassitude. HEENT:    headaches, difficulty swallowing, tooth/dental problems, sore  throat,       sneezing, itching, ear ache, nasal congestion, post nasal drip, snoring CV:    chest pain, orthopnea, PND, swelling in lower extremities, anasarca,                                   dizziness, palpitations Resp:   shortness of breath with exertion or at rest.                productive cough,   non-productive cough, coughing up of  blood.              change in color of mucus.  wheezing.   Skin:    rash or lesions. GI:  No-   heartburn, indigestion, abdominal pain, nausea, vomiting, diarrhea,                 change in bowel habits, loss of appetite GU: dysuria, change in color of urine, no urgency or frequency.   flank pain. MS:   joint pain, stiffness, decreased range of motion, back pain. Neuro-     nothing unusual Psych:  change in mood or affect.  depression or anxiety.   memory loss.  OBJ- Physical Exam General- Alert, Oriented, Affect-appropriate, Distress- none acute, +overweight, +drowsy Skin- rash-none, lesions- none, excoriation- none Lymphadenopathy- none Head- atraumatic            Eyes- Gross vision intact, PERRLA, conjunctivae and secretions clear            Ears- Hearing, canals-normal            Nose- Clear, no-Septal dev, mucus, polyps, erosion, perforation             Throat- Mallampati III-IV , mucosa clear , drainage- none, tonsils- atrophic, +teeth Neck- flexible , trachea midline, no stridor , thyroid nl, carotid no bruit Chest - symmetrical excursion , unlabored           Heart/CV- RRR , no murmur , no gallop  , no rub, nl s1 s2                           - JVD- none , edema- none, stasis changes- none, varices- none           Lung- clear to P&A, wheeze- none, cough- none , dullness-none, rub- none           Chest wall-  Abd-  Br/ Gen/ Rectal- Not done, not indicated Extrem- cyanosis- none, clubbing, none, atrophy- none, strength- nl Neuro- + drowsy during exam and discussion

## 2022-01-24 ENCOUNTER — Encounter: Payer: Self-pay | Admitting: Internal Medicine

## 2022-01-24 ENCOUNTER — Ambulatory Visit (INDEPENDENT_AMBULATORY_CARE_PROVIDER_SITE_OTHER): Payer: Medicaid Other | Admitting: Internal Medicine

## 2022-01-24 DIAGNOSIS — G47419 Narcolepsy without cataplexy: Secondary | ICD-10-CM | POA: Insufficient documentation

## 2022-01-24 DIAGNOSIS — F5101 Primary insomnia: Secondary | ICD-10-CM | POA: Diagnosis not present

## 2022-01-24 MED ORDER — AMPHETAMINE-DEXTROAMPHETAMINE 15 MG PO TABS: ORAL_TABLET | ORAL | 0 refills | Status: DC

## 2022-01-24 MED ORDER — AMPHETAMINE-DEXTROAMPHET ER 30 MG PO CP24: 30.0000 mg | ORAL_CAPSULE | Freq: Every day | ORAL | 0 refills | Status: DC

## 2022-01-24 NOTE — Patient Instructions (Signed)
Scripts sent for  Adderall 30 mg XR- extended release.  You can take one of these in the morning.  Adderall 15 mg     You can take one of these twice daily, if needed.  Please drive safely.

## 2022-01-24 NOTE — Assessment & Plan Note (Signed)
We are going to try higher dose Adderall and this was discussed.  She remains responsible for safe driving and this was also emphasized. Plan-Adderall 30 mg XR once daily.  Adderall 20 mg twice daily supplement if needed.  Naps when able.

## 2022-01-24 NOTE — Assessment & Plan Note (Signed)
I am watching how this responds to the increased doses of stimulant medication in the daytime.  Appropriate management of naps discussed.  Difficulty maintaining normal barriers between  sleep and wake  is routine with narcolepsy.  Watch for circadian rhythm and delayed sleep phase disorders.

## 2022-01-28 ENCOUNTER — Ambulatory Visit: Payer: Medicaid Other | Admitting: Physical Therapy

## 2022-01-29 NOTE — Therapy (Addendum)
OUTPATIENT PHYSICAL THERAPY TREATMENT NOTE + DISCHARGE SUMMARY    Patient Name: Abella Shugart MRN: [id number] DOB:1985/10/03, 36 y.o., female Today's Date: 01/30/2022  PHYSICAL THERAPY DISCHARGE SUMMARY  Visits from Start of Care: 5  Current functional level related to goals / functional outcomes: Unknown - pt not seen in clinic since 01/30/22   Remaining deficits: Unable to assess   Education / Equipment: Unable to assess   Patient unable to agree to discharge given lack of follow up. Patient goals were partially met. Patient is being discharged due to not returning since the last visit.    PCP: Latanya Presser MD   REFERRING PROVIDER: Latanya Presser MD  END OF SESSION:   PT End of Session - 01/30/22 0841     Visit Number 5    Number of Visits 9    Date for PT Re-Evaluation 02/21/22    Authorization Type [employer]    Authorization - Visit Number 5    Authorization - Number of Visits 27    PT Start Time [phone]    PT Stop Time 0930    PT Time Calculation (min) 48 min    Activity Tolerance Patient tolerated treatment well    Behavior During Therapy [facility] for tasks assessed/performed                Past Medical History:  Diagnosis Date   Anemia    Depression    Glaucoma    History of cesarean section, classical 01/21/2018   Low transverse with 5 cm extension into muscular layer--was told at [facility] risk of rupture--will treat as prior classical   Mental disorder    Type 2 diabetes mellitus without complications ([place])    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   Pt had laparoscopic curgery for urachal cyst   Past Surgical History:  Procedure Laterality Date   CESAREAN SECTION     CESAREAN SECTION WITH BILATERAL TUBAL LIGATION Bilateral 07/21/2018   Procedure: CESAREAN SECTION WITH BILATERAL TUBAL LIGATION;  Surgeon: Gwynne Edinger, MD;  Location: [facility] LD ORS;  Service: Obstetrics;  Laterality: Bilateral;   CYSTECTOMY     Patient Active Problem List   Diagnosis  Date Noted   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   Excessive daytime sleepiness 09/18/2021   Microcytosis 05/21/2021   Iron deficiency anemia 08/27/2020   Flu-like symptoms 04/06/2020   Healthcare maintenance 11/26/2019   Insomnia 01/23/2019   Cervical radiculopathy 12/08/2018   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   Depression 03/23/2018   Glaucoma 03/23/2018   Family history of hypertrophic cardiomyopathy 01/21/2018   Diabetes mellitus type 2 in obese ([place]) 01/21/2018    REFERRING DIAG: M54.50 (ICD-10-CM) - Low back pain, unspecified  THERAPY DIAG:  Other low back pain  Muscle weakness (generalized)  Other abnormalities of gait and mobility  Rationale for Evaluation and Treatment Rehabilitation  PERTINENT HISTORY: DM2, past C section  PRECAUTIONS: none  SUBJECTIVE:  Pt states she continues to improve overall, still has pain "here and there" pending on activity and amount of time up on feet. Pt states she has been doing HEP and it improves symptoms at times (depends on severity of symptoms).     PAIN: Are you having pain: none at present, 3/10 Location: BIL low back at belt line How would you describe your pain? Variable, mostly aching Best in past week: 0/10  Worst in past week: 7-8/10 Aggravating factors: Standing >60-90sec, walking from car to door of home, going up and down stairs, lying on back Easing factors: positional  changes, tylenol, massage chair, self manipulation, lying prone    OBJECTIVE: (objective measures completed at initial evaluation unless otherwise dated)   DIAGNOSTIC FINDINGS:  None per pt   PATIENT SURVEYS:  Modified Oswestry 72%  01/21/22: 58%       COGNITION:           Overall cognitive status: Within functional limits for tasks assessed                          SENSATION: WFL     POSTURE: rounded shoulders, forward head, increased lumbar lordosis, and increased thoracic kyphosis   PALPATION: TTP BIL QL and lumbar paraspinals,  palpable tightness B glutes and piriformis Hypomobile and relieving throughout thoracic spine with PA mobs, hypomobile and mild discomfort PA mobs lumbar spine   LUMBAR ROM:    Active  A/PROM  eval 01/21/22  Flexion ~25% 75% relieving  Extension 75% 100%  Right lateral flexion 50% 100%  Left lateral flexion 50% 100% mild pull   Right rotation 25% 75%  Left rotation 25% 75%   (Blank rows = not tested) Comments: Flexion painful, extension less painful, R sidebending painful but similar in ROM to L,     LOWER EXTREMITY MMT:     MMT Right eval Left eval R/L 01/21/22  Hip flexion 4+ 4+ 5/5 no strain  Hip abduction (modified sitting) 5 5   Hip internal rotation       Hip external rotation       Knee flexion 5 5   Knee extension 5      (Blank rows = not tested)   Comments: nonpainful straining in low back with hip flexion       FUNCTIONAL TESTS:  5xSTS: standard chair, 34 sec, no UE            Reduced fwd weight shift, narrow BOS, knee valgus B, rigid posture 01/16/22: 5xSTS  22 sec standard chair no UE 01/21/22: 5xSTS 23 sec standard chair no UE     GAIT: Distance walked: Within clinic Assistive device utilized: None Level of assistance: Complete Independence Comments: Excessive lumbar lordosis, wide BOS, reduced truncal rotation and arm swing, excessive lateral weightshifting, reduced gait speed       TODAY'S TREATMENT  [facility] Adult PT Treatment:                                                DATE: 01/30/22 Therapeutic Exercise: Swiss ball rollout 3 way 12x each Open books x10 B STS from mat 2x10 with 5kg ball  10#KB deadlift from 10inch height, 3x8 cues for hip hinge and pacing, BOS Suitcase carry 10# KB 3x54f BUE   Neuromuscular re-ed: Standing physioball press downs x15, fwd and lateral B for improved core activation, cues for breath control Bridge + ball squeeze 2x15 for improved lumbopelvic stability Cable column high>low chop 3# x10 B, 7# x8 for rotational  core stability, standing, cues for form and pacing   [facility]                                                [facility]: [facility] Therapeutic Exercise: Swiss ball rollout 3 way 12x  each Open books x8 B STS from raised surface 3x6 with 5kg ball (in addition to 5xSTS BW) 10#KB deadlift from 12inch height, 2x8 cues for hip hinge and pacing, BOS Neuromuscular re-ed: Standing physioball press downs x12, fwd and lateral B for improved core activation, cues for breath control Bridge + ball squeeze 2x12 for improved lumbopelvic stability   [facility] Adult PT Treatment:                                                DATE: 01/16/22 Therapeutic Exercise: Swiss ball rollout 3 way 12x each LTR x12 B Standing marches 5# ankle weights, UE support, 3x8 STS from raised surface 3x5 with 3kg ball (in addition to 5xSTS BW) 5#KB deadlift from 14inch height, 2x12 cues for hip hinge and pacing, BOS Neuromuscular re-ed: Standing physioball press downs 2x12, fwd flex only for improved core activation, cues for breath control Bridge + ball squeeze 3x8 for improved lumbopelvic stability       PATIENT EDUCATION:  Education details: HEP review, progress with PT thus far, rationale for interventions rationale for interventions Person educated: Patient Education method: Explanation, Demonstration, Tactile cues, Verbal cues Education comprehension: verbalized understanding, returned demonstration, verbal cues required, tactile cues required, and needs further education      HOME EXERCISE PROGRAM: Access Code: [email or web address] URL: [email or web address] Date: 12/25/2021 Prepared by: Enis Slipper   Exercises - Seated Trunk Rotation - Arms Crossed  - 1 x daily - 7 x weekly - 3 sets - 10 reps - Seated Forward Bending  - 1 x daily - 7 x weekly - 3 sets - 10 reps   ASSESSMENT:   CLINICAL IMPRESSION: Pt is a 36 y.o woman arriving to PT on this date for PT treatment of low back pain. Pt arrives  with minimal pain, primary complaints of stiffness. Pt tolerates session quite well with progression for rotational stability exercises and functional strengthening, intermittent cues for form as needed. Pt denies any increase in pain as session progresses, primary report of muscle fatigue/working sensation.   Recommend skilled PT to address aforementioned deficits to maximize functional independence and tolerance. Pt departs today's session in no acute distress, all voiced questions/concerns addressed appropriately from PT perspective.         OBJECTIVE IMPAIRMENTS Abnormal gait, decreased activity tolerance, decreased endurance, decreased mobility, difficulty walking, decreased ROM, decreased strength, hypomobility, impaired flexibility, and pain.    ACTIVITY LIMITATIONS carrying, lifting, bending, sitting, standing, squatting, and stairs   PARTICIPATION LIMITATIONS: meal prep, cleaning, laundry, shopping, community activity, and occupation   PERSONAL FACTORS 1 comorbidity: DM  are also affecting patient's functional outcome.    REHAB POTENTIAL: Good   CLINICAL DECISION MAKING: Evolving/moderate complexity   EVALUATION COMPLEXITY: Low     GOALS: Goals reviewed with patient? Yes   SHORT TERM GOALS: Target date: 01/08/2022   Pt will score >62% on ODI to indicate improved perception of function due to low back pain. Baseline: 72% 01/21/22: 58% Goal status: MET   2.  Pt will perform 5xSTS in < or = 25 sec to indicate reduced fall risk and improved functional independence. Baseline: 34 sec standard chair no UE support 01/16/22: 22 sec Goal status: MET         LONG TERM GOALS: Target date: 01/22/2022 (extend to 02/21/22 on 01/21/22)   Pt will score < 50% on ODI  in order to demonstrate improved perception of functional status due to symptoms (MCID = 6 pts) Baseline: 72%% 01/21/22: 58% Goal status: progressing   2.  Pt will demonstrate 75% lumbar flexion AROM in order to demonstrate  improved tolerance to functional movement patterns.    Baseline: 25%, painful 01/21/22: 75% relieving Goal status: MET   3. Pt will report ability to tolerate standing >13mn with <5/10 pain in order to demonstrate improved tolerance to daily activities such as cooking/cleaning.  Baseline: 30sec per pt report 01/16/22: Pt states last night she did dishes/laundry (took about an hour to an hour and a half) with pain up to 5/10 Goal status: MET   4. Pt will perform 5xSTS in <15 sec in order to demonstrate reduced fall risk and improved functional independence. (MCID of 2.3sec) Baseline: 34sec 01/16/22: 22sec            Goal status: PROGRESSING     PLAN: PT FREQUENCY: 1-2x/week   PT DURATION: 4 weeks   PLANNED INTERVENTIONS: Therapeutic exercises, Therapeutic activity, Neuromuscular re-education, Balance training, Gait training, Patient/Family education, Self Care, Joint mobilization, Joint manipulation, Stair training, Spinal manipulation, Spinal mobilization, Cryotherapy, Moist heat, Manual therapy, and Re-evaluation.   PLAN FOR NEXT SESSION:  progress strengthening as able/appropriate, consider sled push/pull    DLeeroy ChaPT, DPT 01/30/2022 9:29 AM    Addendum for discharge: DLeeroy ChaPT, DPT 03/20/2022 10:44 AM

## 2022-01-30 ENCOUNTER — Ambulatory Visit: Payer: Medicaid Other | Admitting: Physical Therapy

## 2022-01-30 ENCOUNTER — Encounter: Payer: Self-pay | Admitting: Physical Therapy

## 2022-01-30 DIAGNOSIS — R2689 Other abnormalities of gait and mobility: Secondary | ICD-10-CM

## 2022-01-30 DIAGNOSIS — M5459 Other low back pain: Secondary | ICD-10-CM

## 2022-01-30 DIAGNOSIS — M6281 Muscle weakness (generalized): Secondary | ICD-10-CM

## 2022-02-23 NOTE — Progress Notes (Unsigned)
12/10/21- 39 yoF former smoker with concern of EDS Medical problem list includes DM2, Obesity, Cervical Radiculopathy, Depression, Glaucoma, FE Anemia,  09/18/2021- Initial Eval by Volanda Napoleon, NP: Patient presents today for new sleep consult.  Patient reports symptoms of insomnia, fallas asleep while driving, restless sleep and exhausted. She had NPSG on 05/17/21 that showed no evidence of sleep apnea, average AHI 0.6/hr with SpO2 low 92%. She falls asleep very easily during the day. She is tired all the time. Her typicl bedtime varies, she try's to go to sleep by 11pm. Sometimes it will take her 1-4 hours to fall asleep. She wakes up on average once a night. She starts her day at 7:30 to help her son get ready for virtual home schooling. She will lay back down around 8am and it might take her until 9:30-10am to fall asleep and she will get an additional 1-2 hours of sleep. She has tried several sleep aids in the past including 5mg  Ambien, 2-4mg  Lunesta, melatonin and trazodone in the past without improvement.  She is currently being weaned off of Lunesta and is on 1 mg at bedtime.  She has used Benadryl in the past with some results however her eye doctor told her not to take this regularly due to her history of glaucoma.  Concern for possible narcolepsy.  No symptoms of cataplexy or sleepwalking. NPSG 05/17/21- AHI 0.6/hr, desaturation to 92%, body weight 250 lbs REM latency 41 minutes, REM 26.9% of TST NPSG 11/12/21- AHI 2.5/ hr, desaturation to 86%, body weight 250 lbs  REM latency 67.5 minutes, REM 27.4% of TST MSLT 11/13/21- Mean Sleep Latency 5.86 minutes, Slept on 5/5 naps, 1 SOREM (pt unsure about recent use of Prozac or Lunesta prior to this NPSG) Usual meds include Lunesta 1 mg, Prozac 10 mg,  Epworth score- Body weight today-256 lbs This married mother describes significant daytime sleepiness since early childhood.  Pattern is stable.  Needs naps but does not find them refreshing.  We discussed safety  responsibility while driving.  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  No similar family history.  Husband tells her she snores.  Not currently taking anything to help her difficulty with sleep onset, saying medications including most recently Lunesta did not help.  Once asleep she sleeps soundly.  1 cup of coffee once or twice a week.  Does not find it very helpful.  Denies pregnancy.  Thyroid function checked. We discussed her sleep studies which show no evidence of significant OSA.  Pathologic sleepiness demonstrated on MSLT not meeting the specific evidence of increased REM pressure usually required for a firm diagnosis of narcolepsy.  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  01/24/22- 36 yoF former smoker followed for Narcolepsy, complicated by Obesity, Cervical Radiculopathy, Depression, Glaucoma, FE Anemia -Lunesta 1 mg, Adderall 10 bid,  Body weight today- Covid vax-no Flu vax-declines Unfortunately she recognizes no improvement in oppressive sleepiness taking Adderall 10 mg twice daily.  There have been no other changes or new events.  We again emphasized her responsibility to drive safely or not drive.  We are going to try higher dose Adderall. While I was speaking to her she was awake and interactive. While I was typing she would get drowsy without direct stimulation.  02/25/22- 36 yoF former smoker followed for Narcolepsy, complicated by Obesity, Cervical Radiculopathy, Depression, Glaucoma, FE Anemia -Lunesta 1 mg, Adderall 10 bid,  Body weight today- Covid vax-no  Flu vax-  ROS-see HPI   + = positive Constitutional:    weight loss, night sweats, fevers, chills, +fatigue, lassitude. HEENT:    headaches, difficulty swallowing, tooth/dental problems, sore throat,       sneezing, itching, ear ache, nasal congestion, post nasal drip, snoring CV:    chest pain, orthopnea, PND, swelling in  lower extremities, anasarca,                                   dizziness, palpitations Resp:   shortness of breath with exertion or at rest.                productive cough,   non-productive cough, coughing up of blood.              change in color of mucus.  wheezing.   Skin:    rash or lesions. GI:  No-   heartburn, indigestion, abdominal pain, nausea, vomiting, diarrhea,                 change in bowel habits, loss of appetite GU: dysuria, change in color of urine, no urgency or frequency.   flank pain. MS:   joint pain, stiffness, decreased range of motion, back pain. Neuro-     nothing unusual Psych:  change in mood or affect.  depression or anxiety.   memory loss.  OBJ- Physical Exam General- Alert, Oriented, Affect-appropriate, Distress- none acute, +overweight, +drowsy Skin- rash-none, lesions- none, excoriation- none Lymphadenopathy- none Head- atraumatic            Eyes- Gross vision intact, PERRLA, conjunctivae and secretions clear            Ears- Hearing, canals-normal            Nose- Clear, no-Septal dev, mucus, polyps, erosion, perforation             Throat- Mallampati III-IV , mucosa clear , drainage- none, tonsils- atrophic, +teeth Neck- flexible , trachea midline, no stridor , thyroid nl, carotid no bruit Chest - symmetrical excursion , unlabored           Heart/CV- RRR , no murmur , no gallop  , no rub, nl s1 s2                           - JVD- none , edema- none, stasis changes- none, varices- none           Lung- clear to P&A, wheeze- none, cough- none , dullness-none, rub- none           Chest wall-  Abd-  Br/ Gen/ Rectal- Not done, not indicated Extrem- cyanosis- none, clubbing, none, atrophy- none, strength- nl Neuro- + drowsy during exam and discussion

## 2022-02-24 ENCOUNTER — Telehealth: Payer: Self-pay | Admitting: Internal Medicine

## 2022-02-24 MED ORDER — AMPHETAMINE-DEXTROAMPHET ER 20 MG PO CP24: ORAL_CAPSULE | ORAL | 0 refills | Status: DC

## 2022-02-24 NOTE — Telephone Encounter (Signed)
Appreciate the query from [place] at [facility] [address]  After reconsidering- please cancel the script for Adderall 30 XR and Adderall 15 mg.  I am sending new order for Adderall 20 mg, 1 twice daily if needed.

## 2022-02-24 NOTE — Telephone Encounter (Signed)
Called and spoke with Melodie at [facility] and she is wanting to make sure the dosage for the Addera;l. Melodie states that this is a huge jump with what she was currently on before.   Sir please clarify   Thank you

## 2022-02-25 ENCOUNTER — Encounter: Payer: Self-pay | Admitting: Internal Medicine

## 2022-02-25 ENCOUNTER — Ambulatory Visit (INDEPENDENT_AMBULATORY_CARE_PROVIDER_SITE_OTHER): Payer: Medicaid Other | Admitting: Internal Medicine

## 2022-02-25 VITALS — BP 122/86 | HR 102 | Ht 67.0 in | Wt 247.2 lb

## 2022-02-25 DIAGNOSIS — G47419 Narcolepsy without cataplexy: Secondary | ICD-10-CM | POA: Diagnosis not present

## 2022-02-25 MED ORDER — MODAFINIL 200 MG PO TABS: ORAL_TABLET | ORAL | 0 refills | Status: DC

## 2022-02-25 NOTE — Patient Instructions (Signed)
We have added modafinil (Provigil)- 1 each morning as needed  Don't take more than 3 adderall 20 XR in a day.

## 2022-02-26 ENCOUNTER — Encounter: Payer: Self-pay | Admitting: Internal Medicine

## 2022-02-26 ENCOUNTER — Encounter: Payer: Self-pay | Admitting: Physician Assistant

## 2022-02-26 ENCOUNTER — Telehealth: Payer: Self-pay

## 2022-02-26 ENCOUNTER — Other Ambulatory Visit (HOSPITAL_COMMUNITY): Payer: Self-pay

## 2022-02-26 MED ORDER — MODAFINIL 200 MG PO TABS: ORAL_TABLET | ORAL | 0 refills | Status: DC

## 2022-02-26 NOTE — Telephone Encounter (Signed)
PA request received for Modafinil via CMM from [employer].  Per benefits investigation Brand Provigil is the preferred alternative but also needs a PA.  PA has been completed for Brand Provigil 200mg  tablets and submitted.  PA has been APPROVED from 02/26/2022-02/27/2023.  Please send in prescription for Brand Provigil

## 2022-02-26 NOTE — Telephone Encounter (Signed)
Script sent to [facility] for Provigil/ modafinil, with note to pharmacy indicating PA approved for brand name Provigil

## 2022-02-26 NOTE — Addendum Note (Signed)
Addended by: Jetty Duhamel D on: 02/26/2022 10:31 AM   Modules accepted: Orders

## 2022-02-26 NOTE — Assessment & Plan Note (Signed)
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  We have again discussed safe driving and appropriate medication trials, good sleep habits and adequate naps Plan-try modafinil 200 mg and gradually add back Adderall as needed/tolerated.

## 2022-03-03 ENCOUNTER — Other Ambulatory Visit (HOSPITAL_BASED_OUTPATIENT_CLINIC_OR_DEPARTMENT_OTHER): Payer: Self-pay

## 2022-03-03 MED ORDER — OZEMPIC (2 MG/DOSE) 8 MG/3ML ~~LOC~~ SOPN
2.0000 mg | PEN_INJECTOR | SUBCUTANEOUS | 0 refills | Status: DC
  Filled 2022-03-03: qty 3, 28d supply, fill #0

## 2022-03-05 ENCOUNTER — Other Ambulatory Visit (HOSPITAL_BASED_OUTPATIENT_CLINIC_OR_DEPARTMENT_OTHER): Payer: Self-pay

## 2022-04-27 NOTE — Progress Notes (Unsigned)
12/10/21- 62 yoF former smoker with concern of EDS Medical problem list includes DM2, Obesity, Cervical Radiculopathy, Depression, Glaucoma, FE Anemia,  09/18/2021- Initial Eval by Volanda Napoleon, NP: Patient presents today for new sleep consult.  Patient reports symptoms of insomnia, fallas asleep while driving, restless sleep and exhausted. She had NPSG on 05/17/21 that showed no evidence of sleep apnea, average AHI 0.6/hr with SpO2 low 92%. She falls asleep very easily during the day. She is tired all the time. Her typicl bedtime varies, she try's to go to sleep by 11pm. Sometimes it will take her 1-4 hours to fall asleep. She wakes up on average once a night. She starts her day at 7:30 to help her son get ready for virtual home schooling. She will lay back down around 8am and it might take her until 9:30-10am to fall asleep and she will get an additional 1-2 hours of sleep. She has tried several sleep aids in the past including 5mg  Ambien, 2-4mg  Lunesta, melatonin and trazodone in the past without improvement.  She is currently being weaned off of Lunesta and is on 1 mg at bedtime.  She has used Benadryl in the past with some results however her eye doctor told her not to take this regularly due to her history of glaucoma.  Concern for possible narcolepsy.  No symptoms of cataplexy or sleepwalking. NPSG 05/17/21- AHI 0.6/hr, desaturation to 92%, body weight 250 lbs REM latency 41 minutes, REM 26.9% of TST NPSG 11/12/21- AHI 2.5/ hr, desaturation to 86%, body weight 250 lbs  REM latency 67.5 minutes, REM 27.4% of TST MSLT 11/13/21- Mean Sleep Latency 5.86 minutes, Slept on 5/5 naps, 1 SOREM (pt unsure about recent use of Prozac or Lunesta prior to this NPSG) Usual meds include Lunesta 1 mg, Prozac 10 mg,  Epworth score- Body weight today-256 lbs This married mother describes significant daytime sleepiness since early childhood.  Pattern is stable.  Needs naps but does not find them refreshing.  We discussed safety  responsibility while driving.  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  No similar family history.  Husband tells her she snores.  Not currently taking anything to help her difficulty with sleep onset, saying medications including most recently Lunesta did not help.  Once asleep she sleeps soundly.  1 cup of coffee once or twice a week.  Does not find it very helpful.  Denies pregnancy.  Thyroid function checked. We discussed her sleep studies which show no evidence of significant OSA.  Pathologic sleepiness demonstrated on MSLT not meeting the specific evidence of increased REM pressure usually required for a firm diagnosis of narcolepsy.  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  -----------------------------------------  02/25/22- 36 yoF former smoker followed for Narcolepsy, complicated by Obesity, Cervical Radiculopathy, Depression, Glaucoma, FE Anemia -Lunesta 1 mg, Adderall 20 XR bid,   Body weight today-247 lbs Covid vax-no                                 Mother here Flu vax-declines Valory recognizes no alerting effect from Adderall 20 mg XR taken twice daily.  She still naps "most of the day".  Mother confirms this and says that this has been the pattern since early childhood and frequent concern in school. I am going to try adding modafinil with discussion about how to add back Adderall gradually in  addition if needed.  Shulamit seems to have no real concept of overstimulation and I doubt she has ever experienced this feeling.  Her Medicaid coverage may limit formulary options.  04/29/22- 36 yoF former smoker followed for Narcolepsy, complicated by Obesity, Cervical Radiculopathy, Depression, Glaucoma, FE Anemia -Lunesta 1 mg, Adderall 20 XR bid,  modafinil 200, Body weight today-247 lbs Covid vax-no                                Flu vax-no Says neither adderall xr 20 or modafinil 200 is  enough by itself, although modafinil was good for first few days. Together they were too much. Naps are necessary but unrefreshing. We are going to continue modafinil and let her try adjusting adderall 10 mg tabs. Denies chest pain, headache, palpitation. Denies pregnant.  ROS-see HPI   + = positive Constitutional:    weight loss, night sweats, fevers, chills, +fatigue, lassitude. HEENT:    headaches, difficulty swallowing, tooth/dental problems, sore throat,       sneezing, itching, ear ache, nasal congestion, post nasal drip, snoring CV:    chest pain, orthopnea, PND, swelling in lower extremities, anasarca,                                   dizziness, palpitations Resp:   shortness of breath with exertion or at rest.                productive cough,   non-productive cough, coughing up of blood.              change in color of mucus.  wheezing.   Skin:    rash or lesions. GI:  No-   heartburn, indigestion, abdominal pain, nausea, vomiting, diarrhea,                 change in bowel habits, loss of appetite GU: dysuria, change in color of urine, no urgency or frequency.   flank pain. MS:   joint pain, stiffness, decreased range of motion, back pain. Neuro-     nothing unusual Psych:  change in mood or affect.  depression or anxiety.   memory loss.  OBJ- Physical Exam General- Calmly awake/ yawning, Oriented, Affect-appropriate, Distress- none acute, +overweight,  Skin- rash-none, lesions- none, excoriation- none Lymphadenopathy- none Head- atraumatic            Eyes- Gross vision intact, PERRLA, conjunctivae and secretions clear            Ears- Hearing, canals-normal            Nose- Clear, no-Septal dev, mucus, polyps, erosion, perforation             Throat- Mallampati III-IV , mucosa clear , drainage- none, tonsils- atrophic, +teeth Neck- flexible , trachea midline, no stridor , thyroid nl, carotid no bruit Chest - symmetrical excursion , unlabored           Heart/CV- RRR/  Note  HR 102 , no murmur , no gallop  , no rub, nl s1 s2                           - JVD- none , edema- none, stasis changes- none, varices- none           Lung- clear to P&A, wheeze- none, cough-  none , dullness-none, rub- none           Chest wall-  Abd-  Br/ Gen/ Rectal- Not done, not indicated Extrem- cyanosis- none, clubbing, none, atrophy- none, strength- nl Neuro- + acting drowsy, but awake and alert during direct discussion

## 2022-04-29 ENCOUNTER — Encounter: Payer: Self-pay | Admitting: Internal Medicine

## 2022-04-29 ENCOUNTER — Ambulatory Visit: Payer: Medicaid Other | Admitting: Internal Medicine

## 2022-04-29 VITALS — BP 116/70 | HR 99 | Ht 67.0 in | Wt 234.0 lb

## 2022-04-29 DIAGNOSIS — G47419 Narcolepsy without cataplexy: Secondary | ICD-10-CM

## 2022-04-29 DIAGNOSIS — F5101 Primary insomnia: Secondary | ICD-10-CM

## 2022-04-29 MED ORDER — AMPHETAMINE-DEXTROAMPHETAMINE 10 MG PO TABS: ORAL_TABLET | ORAL | 0 refills | Status: DC

## 2022-04-29 NOTE — Patient Instructions (Signed)
Stop the adderall 20 mg XR  Instead take adderall 10 mg, up to 3 tabs daily as needed  Continue the modafinil(Provigil) 200 mg daily  Please call as needed

## 2022-04-29 NOTE — Assessment & Plan Note (Signed)
Continue attention to good sleep hygiene

## 2022-04-29 NOTE — Assessment & Plan Note (Signed)
We continue to adjust meds, emphasize naps, safe driving  Plan- try modafinil with up to 3 x adderall 10 mg daily

## 2022-06-09 ENCOUNTER — Telehealth: Payer: Medicaid Other | Admitting: Physician Assistant

## 2022-06-09 DIAGNOSIS — R3989 Other symptoms and signs involving the genitourinary system: Secondary | ICD-10-CM | POA: Diagnosis not present

## 2022-06-09 MED ORDER — SULFAMETHOXAZOLE-TRIMETHOPRIM 800-160 MG PO TABS: 1.0000 | ORAL_TABLET | Freq: Two times a day (BID) | ORAL | 0 refills | Status: DC

## 2022-06-09 MED ORDER — FLUCONAZOLE 150 MG PO TABS: ORAL_TABLET | ORAL | 0 refills | Status: DC

## 2022-06-09 NOTE — Patient Instructions (Signed)
Nicholos Johns, thank you for joining Leeanne Rio, PA-C for today's virtual visit.  While this provider is not your primary care provider (PCP), if your PCP is located in our provider database this encounter information will be shared with them immediately following your visit.   [facility] account gives you access to today's visit and all your visits, tests, and labs performed at [facility] " click here if you don't have a [facility] account or go to [email or web address]  Consent: (Patient) Christina Morton provided verbal consent for this virtual visit at the beginning of the encounter.  Current Medications:  Current Outpatient Medications:    amphetamine-dextroamphetamine (ADDERALL) 10 MG tablet, Up to 3 tabs daily as needed, Disp: 90 tablet, Rfl: 0   Cholecalciferol (VITAMIN D3 ADULT GUMMIES) 25 MCG (1000 UT) CHEW, 1 tablet, Disp: , Rfl:    famotidine (PEPCID) 20 MG tablet, Take 1 tablet (20 mg total) by mouth at bedtime., Disp: 30 tablet, Rfl: 1   FLUoxetine (PROZAC) 10 MG capsule, 1 capsule, Disp: , Rfl:    glipiZIDE (GLUCOTROL XL) 10 MG 24 hr tablet, Take 10 mg by mouth daily., Disp: , Rfl:    levonorgestrel (MIRENA) 20 MCG/DAY IUD, by Intrauterine route., Disp: , Rfl:    modafinil (PROVIGIL) 200 MG tablet, 1 each morning for alertness, Disp: 30 tablet, Rfl: 0   rizatriptan (MAXALT) 10 MG tablet, Take 1 tablet earliest onset of migraine.  May repeat in 2 hours if needed.  Maximum 2 tablets in 24 hours, Disp: 10 tablet, Rfl: 3   rosuvastatin (CRESTOR) 10 MG tablet, Take 1 tablet by mouth daily., Disp: , Rfl:    Semaglutide, 2 MG/DOSE, (OZEMPIC, 2 MG/DOSE,) 8 MG/3ML SOPN, Inject 2 mg into the skin once a week., Disp: 3 mL, Rfl: 0   topiramate (TOPAMAX) 50 MG tablet, Take 104m at hs for one week and then 557mat thereforth at hs, Disp: , Rfl:    Medications ordered in this encounter:  No orders of the defined types were placed in this  encounter.    *If you need refills on other medications prior to your next appointment, please contact your pharmacy*  Follow-Up: Call back or seek an in-person evaluation if the symptoms worsen or if the condition fails to improve as anticipated.  [phone] Instructions Your symptoms are consistent with a bladder infection, also called acute cystitis. Please take your antibiotic (Bactrim) as directed until all pills are gone.  Stay very well hydrated.  Consider a daily probiotic (Align, Culturelle, or Activia) to help prevent stomach upset caused by the antibiotic.  Taking a probiotic daily may also help prevent recurrent UTIs.  Also consider taking AZO (Phenazopyridine) tablets to help decrease pain with urination.     Urinary Tract Infection A urinary tract infection (UTI) can occur any place along the urinary tract. The tract includes the kidneys, ureters, bladder, and urethra. A type of germ called bacteria often causes a UTI. UTIs are often helped with antibiotic medicine.  HOME CARE  If given, take antibiotics as told by your doctor. Finish them even if you start to feel better. Drink enough fluids to keep your pee (urine) clear or pale yellow. Avoid tea, drinks with caffeine, and bubbly (carbonated) drinks. Pee often. Avoid holding your pee in for a long time. Pee before and after having sex (intercourse). Wipe from front to back after you poop (bowel movement) if you are a woman.  Use each tissue only once. GET HELP RIGHT AWAY IF:  You have back pain. You have lower belly (abdominal) pain. You have chills. You feel sick to your stomach (nauseous). You throw up (vomit). Your burning or discomfort with peeing does not go away. You have a fever. Your symptoms are not better in 3 days. MAKE SURE YOU:  Understand these instructions. Will watch your condition. Will get help right away if you are not doing well or get worse. Document Released:  09/24/2007 Document Revised: 12/31/2011 Document Reviewed: 11/06/2011 [facility] Patient Information 2015 [place], [place]. This information is not intended to replace advice given to you by your health care provider. Make sure you discuss any questions you have with your health care provider.    If you have been instructed to have an in-person evaluation today at a local Urgent Care facility, please use the link below. It will take you to a list of all of our available [facility], including address, phone number and hours of operation. Please do not delay care.  [facility]  If you or a family member do not have a primary care provider, use the link below to schedule a visit and establish care. When you choose a [facility] primary care physician or advanced practice provider, you gain a long-term partner in health. Find a Primary Care Provider  Learn more about [facility]'s in-office and virtual care options: [facility]

## 2022-06-09 NOTE — Progress Notes (Signed)
Virtual Visit Consent   [name], you are scheduled for a virtual visit with a [facility] provider today. Just as with appointments in the office, your consent must be obtained to participate. Your consent will be active for this visit and any virtual visit you may have with one of our providers in the next 365 days. If you have a MyChart account, a copy of this consent can be sent to you electronically.  As this is a virtual visit, video technology does not allow for your provider to perform a traditional examination. This may limit your provider's ability to fully assess your condition. If your provider identifies any concerns that need to be evaluated in person or the need to arrange testing (such as labs, EKG, etc.), we will make arrangements to do so. Although advances in technology are sophisticated, we cannot ensure that it will always work on either your end or our end. If the connection with a video visit is poor, the visit may have to be switched to a telephone visit. With either a video or telephone visit, we are not always able to ensure that we have a secure connection.  By engaging in this virtual visit, you consent to the provision of healthcare and authorize for your insurance to be billed (if applicable) for the services provided during this visit. Depending on your insurance coverage, you may receive a charge related to this service.  I need to obtain your verbal consent now. Are you willing to proceed with your visit today? Christina Morton has provided verbal consent on 06/09/2022 for a virtual visit (video or telephone). Leeanne Rio, [place]  Date: 06/09/2022 10:04 AM  Virtual Visit via Video Note   I, Leeanne Rio, connected with  Christina Morton  ([id number], 1985-07-04) on 06/09/22 at  9:45 AM EST by a video-enabled telemedicine application and verified that I am speaking with the correct person using two identifiers.  Location: Patient: Virtual Visit Location  Patient: Home Provider: Virtual Visit Location Provider: Home Office   I discussed the limitations of evaluation and management by telemedicine and the availability of in person appointments. The patient expressed understanding and agreed to proceed.    History of Present Illness: Christina Morton is a 37 y.o. who identifies as a female who was assigned female at birth, and is being seen today for possible UTI. Symptoms starting yesterday with urinary urgency, frequency, dysuria. Denies nausea/vomiting. Denies vaginal symptoms. History of prior UTI, with most recent a few months ago.   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 Yes Did it involve sudden or severe rash/hives, skin peeling, or any reaction on the inside of your mouth or nose? No Did you need to seek medical attention at a hospital or doctor's office? Yes When did it last happen?      childhood allergy If all above answers are "NO", may proceed  with cephalosporin use.   Penicillins Anaphylaxis    Did it involve swelling of the face/tongue/throat, SOB, or low BP? Yes Did it involve sudden or severe rash/hives, skin peeling, or any reaction on the inside of your mouth or nose? No Did you need to seek medical attention at a hospital or doctor's office? Yes When did it last happen?      childhood  allergy If all above answers are "NO", may proceed with cephalosporin use.    Clindamycin/Lincomycin Diarrhea   Medications:  Current Outpatient Medications:    fluconazole (DIFLUCAN) 150 MG tablet, Take one tablet today. Take the second tablet on last day of antibiotic., Disp: 2 tablet, Rfl: 0   sulfamethoxazole-trimethoprim (BACTRIM DS) 800-160 MG tablet, Take 1 tablet by mouth 2 (two) times daily., Disp: 10 tablet, Rfl: 0   amphetamine-dextroamphetamine (ADDERALL) 10 MG tablet, Up to 3 tabs daily as needed, Disp: 90 tablet, Rfl: 0   Cholecalciferol (VITAMIN D3 ADULT GUMMIES) 25 MCG (1000 UT) CHEW, 1 tablet, Disp: , Rfl:    famotidine (PEPCID) 20 MG tablet, Take 1 tablet (20 mg total) by mouth at bedtime., Disp: 30 tablet, Rfl: 1   FLUoxetine (PROZAC) 10 MG capsule, 1 capsule, Disp: , Rfl:    glipiZIDE (GLUCOTROL XL) 10 MG 24 hr tablet, Take 10 mg by mouth daily., Disp: , Rfl:    levonorgestrel (MIRENA) 20 MCG/DAY IUD, by Intrauterine route., Disp: , Rfl:    modafinil (PROVIGIL) 200 MG tablet, 1 each morning for alertness, Disp: 30 tablet, Rfl: 0   rizatriptan (MAXALT) 10 MG tablet, Take 1 tablet earliest onset of migraine.  May repeat in 2 hours if needed.  Maximum 2 tablets in 24 hours, Disp: 10 tablet, Rfl: 3   rosuvastatin (CRESTOR) 10 MG tablet, Take 1 tablet by mouth daily., Disp: , Rfl:    Semaglutide, 2 MG/DOSE, (OZEMPIC, 2 MG/DOSE,) 8 MG/3ML SOPN, Inject 2 mg into the skin once a week., Disp: 3 mL, Rfl: 0   topiramate (TOPAMAX) 50 MG tablet, Take 48m at hs for one week and then 524mat thereforth at hs, Disp: , Rfl:   Observations/Objective: Patient is well-developed, well-nourished in no acute distress.  Resting comfortably at home.  Head is normocephalic, atraumatic.  No labored breathing. Speech is clear and coherent with logical content.  Patient is alert and oriented at baseline.   Assessment and Plan: 1. Suspected UTI - sulfamethoxazole-trimethoprim (BACTRIM DS)  800-160 MG tablet; Take 1 tablet by mouth 2 (two) times daily.  Dispense: 10 tablet; Refill: 0  Classic UTI symptoms with absence of alarm signs or symptoms. Prior history of UTI. Will treat empirically with Bactrim for suspected uncomplicated cystitis. Supportive measures and OTC medications reviewed. Strict in-person evaluation precautions discussed.    Follow Up Instructions: I discussed the assessment and treatment plan with the patient. The patient was provided an opportunity to ask questions and all were answered. The patient agreed with the plan and demonstrated an understanding of the instructions.  A copy of instructions were sent to the patient via MyChart unless otherwise noted below.   The patient was advised to call back or seek an in-person evaluation if the symptoms worsen or if the condition fails to improve as anticipated.  Time:  I spent 10 minutes with the patient via telehealth technology discussing the above problems/concerns.    WiLeeanne RioPA-C

## 2022-08-28 ENCOUNTER — Other Ambulatory Visit (HOSPITAL_BASED_OUTPATIENT_CLINIC_OR_DEPARTMENT_OTHER): Payer: Self-pay

## 2022-08-28 MED ORDER — MOUNJARO 10 MG/0.5ML ~~LOC~~ SOAJ
10.0000 mg | SUBCUTANEOUS | 0 refills | Status: DC
  Filled 2022-08-28: qty 2, 28d supply, fill #0

## 2022-09-04 ENCOUNTER — Other Ambulatory Visit (HOSPITAL_BASED_OUTPATIENT_CLINIC_OR_DEPARTMENT_OTHER): Payer: Self-pay

## 2022-09-04 MED ORDER — MOUNJARO 10 MG/0.5ML ~~LOC~~ SOAJ
10.0000 mg | SUBCUTANEOUS | 1 refills | Status: DC
  Filled 2022-09-04 – 2022-09-29 (×2): qty 2, 28d supply, fill #0
  Filled 2022-10-26 – 2022-11-05 (×2): qty 2, 28d supply, fill #1

## 2022-09-12 ENCOUNTER — Encounter: Payer: Self-pay | Admitting: Neurology

## 2022-09-29 ENCOUNTER — Other Ambulatory Visit (HOSPITAL_BASED_OUTPATIENT_CLINIC_OR_DEPARTMENT_OTHER): Payer: Self-pay

## 2022-09-30 ENCOUNTER — Other Ambulatory Visit (HOSPITAL_BASED_OUTPATIENT_CLINIC_OR_DEPARTMENT_OTHER): Payer: Self-pay

## 2022-10-15 ENCOUNTER — Other Ambulatory Visit (HOSPITAL_BASED_OUTPATIENT_CLINIC_OR_DEPARTMENT_OTHER): Payer: Self-pay

## 2022-10-15 MED ORDER — MOUNJARO 10 MG/0.5ML ~~LOC~~ SOAJ
10.0000 mg | SUBCUTANEOUS | 1 refills | Status: DC
  Filled 2022-10-15 – 2022-12-10 (×2): qty 2, 28d supply, fill #0

## 2022-10-24 ENCOUNTER — Encounter (HOSPITAL_COMMUNITY): Payer: Self-pay

## 2022-10-24 ENCOUNTER — Ambulatory Visit (HOSPITAL_COMMUNITY)
Admission: EM | Admit: 2022-10-24 | Discharge: 2022-10-24 | Disposition: A | Discharge status: Home / Self Care | Payer: Medicaid Other | Source: Home / Self Care | Attending: Family Medicine | Admitting: Family Medicine

## 2022-10-24 DIAGNOSIS — N3090 Cystitis, unspecified without hematuria: Secondary | ICD-10-CM | POA: Insufficient documentation

## 2022-10-24 LAB — POCT URINALYSIS DIP (MANUAL ENTRY)
Bilirubin, UA: NEGATIVE
Glucose, UA: NEGATIVE mg/dL
Ketones, POC UA: NEGATIVE mg/dL
Nitrite, UA: NEGATIVE
Spec Grav, UA: >=1.03 — AB (ref 1.010–1.025)
Urobilinogen, UA: 0.2 E.U./dL
pH, UA: 5.5 (ref 5.0–8.0)

## 2022-10-24 MED ORDER — FLUCONAZOLE 150 MG PO TABS: 150.0000 mg | ORAL_TABLET | ORAL | 0 refills | Status: AC

## 2022-10-24 MED ORDER — NITROFURANTOIN MONOHYD MACRO 100 MG PO CAPS: 100.0000 mg | ORAL_CAPSULE | Freq: Two times a day (BID) | ORAL | 0 refills | Status: DC

## 2022-10-24 NOTE — ED Provider Notes (Addendum)
[facility]    CSN: [id number] Arrival date & time: 10/24/22  1558      History   Chief Complaint Chief Complaint  Patient presents with   Dysuria   Urinary Frequency    HPI Christina Morton is a 37 y.o. female.    Dysuria Urinary Frequency   Here for pain on urination and urinary frequency and incomplete bladder emptying.  Symptoms began about 2 days ago.  No fever or chills or nausea or vomiting.   penicillins causes anaphylaxis and clindamycin causes diarrhea.  She does have diabetes but her A1c has improved to 6.0 so she is only on Ozempic.  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   Excessive daytime sleepiness 09/18/2021   Microcytosis 05/21/2021   Iron deficiency anemia 08/27/2020   Flu-like symptoms 04/06/2020   Healthcare maintenance 11/26/2019   Insomnia 01/23/2019   Cervical radiculopathy 12/08/2018   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   Depression 03/23/2018   Glaucoma 03/23/2018   Family history of hypertrophic cardiomyopathy 01/21/2018   Diabetes mellitus type 2 in obese 01/21/2018    Past Surgical History:  Procedure Laterality Date   CESAREAN SECTION     CESAREAN SECTION WITH BILATERAL TUBAL LIGATION Bilateral 07/21/2018   Procedure: CESAREAN SECTION WITH BILATERAL TUBAL LIGATION;  Surgeon: Kathrynn Running, MD;  Location: [facility];  Service: Obstetrics;  Laterality: Bilateral;   CYSTECTOMY      OB History     Gravida  2    Para  2   Term  2   Preterm  0   AB  0   Living  2      SAB  0   IAB  0   Ectopic  0   Multiple  0   Live Births  2            Home Medications    Prior to Admission medications   Medication Sig Start Date End Date Taking? Authorizing Provider  nitrofurantoin, macrocrystal-monohydrate, (MACROBID) 100 MG capsule Take 1 capsule (100 mg total) by mouth 2 (two) times daily. 10/24/22  Yes Zenia Resides, MD  amphetamine-dextroamphetamine (ADDERALL) 10 MG tablet Up to 3 tabs daily as needed 04/29/22   Jetty Duhamel D, MD  Cholecalciferol (VITAMIN D3 ADULT GUMMIES) 25 MCG (1000 UT) CHEW 1 tablet    [provider]  famotidine (PEPCID) 20 MG tablet Take 1 tablet (20 mg total) by mouth at bedtime. 12/08/18   Shirley, [place], DO  FLUoxetine (PROZAC) 10 MG capsule 1 capsule    [provider]  glipiZIDE (GLUCOTROL XL) 10 MG 24 hr tablet Take 10 mg by mouth daily. 08/13/20   [provider]  levonorgestrel (MIRENA) 20 MCG/DAY IUD by Intrauterine route.    [provider]  modafinil (PROVIGIL) 200 MG tablet 1 each morning for alertness 02/26/22   Jetty Duhamel D, MD  rizatriptan (MAXALT) 10 MG tablet Take  1 tablet earliest onset of migraine.  May repeat in 2 hours if needed.  Maximum 2 tablets in 24 hours 04/08/19   Shon Millet R, DO  rosuvastatin (CRESTOR) 10 MG tablet Take 1 tablet by mouth daily. 11/21/20   [provider]  Semaglutide, 2 MG/DOSE, (OZEMPIC, 2 MG/DOSE,) 8 MG/3ML SOPN Inject 2 mg into the skin once a week. 02/07/22     tirzepatide (MOUNJARO) 10 MG/0.5ML Pen Inject 10 mg into the skin once a week. 09/04/22     tirzepatide (MOUNJARO) 10 MG/0.5ML Pen Inject 10 mg into the skin once a week. 10/15/22     topiramate (TOPAMAX) 50 MG tablet Take 25mg  at hs for one week and then 50mg  at thereforth at hs 08/11/19   Talbert Forest, [place], DO  zolpidem (AMBIEN) 5 MG tablet Take 1 tablet (5 mg total) by mouth at bedtime. 05/04/19 07/09/19  Nestor Ramp, MD    Family History Family History  Problem Relation Age of Onset   Hypertrophic cardiomyopathy Mother    Bradycardia Mother    Healthy Father    Breast cancer Maternal Great-grandmother     Social History Social History   Tobacco Use   Smoking status: Former    Packs/day: 1.00    Years: 1.00    Additional pack years: 0.00    Total pack years: 1.00    Types: Cigarettes    Quit date: 11/22/2017    Years since quitting: 4.9   Smokeless tobacco: Never  Vaping Use   Vaping Use: Never used  Substance Use Topics   Alcohol use: Not Currently    Comment: once a monthly   Drug use: Yes    Comment: CBD     Allergies   Amoxicillin, Penicillins, and Clindamycin/lincomycin   Review of Systems Review of Systems  Genitourinary:  Positive for dysuria and frequency.     Physical Exam Triage Vital Signs ED Triage Vitals  Enc Vitals Group     BP 10/24/22 1643 110/76     Pulse Rate 10/24/22 1643 78     Resp 10/24/22 1643 16     Temp 10/24/22 1643 98.1 F (36.7 C)     Temp Source 10/24/22 1643 Oral     SpO2 10/24/22 1643 98 %     Weight --      Height --      Head Circumference --      Peak Flow --      Pain Score 10/24/22 1644 0     Pain Loc --      Pain Edu? --      Excl. in GC? --    No data found.  Updated Vital Signs BP 110/76 (BP Location: Left Arm)   Pulse 78   Temp 98.1 F (36.7 C) (Oral)   Resp 16   SpO2 98%   Visual Acuity Right Eye Distance:   Left Eye Distance:   Bilateral Distance:    Right Eye Near:   Left Eye Near:    Bilateral Near:     Physical Exam Vitals reviewed.  Constitutional:      General: She is not in acute distress.    Appearance: She is not ill-appearing, toxic-appearing or diaphoretic.  HENT:     Mouth/Throat:     Mouth: Mucous membranes are moist.  Eyes:     Extraocular Movements: Extraocular movements intact.     Pupils: Pupils are equal, round, and reactive to light.  Cardiovascular:     Rate  and Rhythm:  Normal rate and regular rhythm.     Heart sounds: No murmur heard. Pulmonary:     Effort: Pulmonary effort is normal.     Breath sounds: Normal breath sounds.  Abdominal:     Palpations: Abdomen is soft.     Tenderness: There is no abdominal tenderness. There is no right CVA tenderness or left CVA tenderness.  Musculoskeletal:     Cervical back: Neck supple.  Lymphadenopathy:     Cervical: No cervical adenopathy.  Skin:    Coloration: Skin is not pale.  Neurological:     General: No focal deficit present.     Mental Status: She is alert and oriented to person, place, and time.  Psychiatric:        Behavior: Behavior normal.      UC Treatments / Results  Labs (all labs ordered are listed, but only abnormal results are displayed) Labs Reviewed  POCT URINALYSIS DIP (MANUAL ENTRY) - Abnormal; Notable for the following components:      Result Value   Spec Grav, UA >=1.030 (*)    Blood, UA trace-lysed (*)    Protein Ur, POC trace (*)    Leukocytes, UA Moderate (2+) (*)    All other components within normal limits  URINE CULTURE    EKG   Radiology No results found.  Procedures Procedures (including critical care time)  Medications Ordered in UC Medications - No data to display  Initial Impression / Assessment and Plan / UC Course  I have reviewed the triage vital signs and the nursing notes.  Pertinent labs & imaging results that were available during my care of the patient were reviewed by me and considered in my medical decision making (see chart for details).        There are moderate amount of leukocytes on the urinalysis.  Macrodantin is sent in for UTI.  Urine culture is sent and we will notify her and change antibiotics if there is resistance. She requested diflucan for potential yeast infection; fluconazole sent in  Final Clinical Impressions(s) / UC Diagnoses   Final diagnoses:  Cystitis     Discharge Instructions      The urinalysis does show  some white blood cells, which could be a sign of a bladder infection  Take nitrofurantoin 100 mg--1 capsule 2 times daily for 5 days  Urine culture is sent and staff will notify you if anything needs to be changed on your antibiotic     ED Prescriptions     Medication Sig Dispense Auth. Provider   nitrofurantoin, macrocrystal-monohydrate, (MACROBID) 100 MG capsule Take 1 capsule (100 mg total) by mouth 2 (two) times daily. 10 capsule Zenia Resides, MD      PDMP not reviewed this encounter.   Zenia Resides, MD 10/24/22 1703    Zenia Resides, MD 10/24/22 [phone]

## 2022-10-24 NOTE — Discharge Instructions (Addendum)
The urinalysis does show some white blood cells, which could be a sign of a bladder infection  Take nitrofurantoin 100 mg--1 capsule 2 times daily for 5 days  Urine culture is sent and staff will notify you if anything needs to be changed on your antibiotic

## 2022-10-24 NOTE — ED Triage Notes (Signed)
Patient reports that she has had dysuria and urinary frequency. Patient states she has had UTI's in the past.

## 2022-10-25 LAB — URINE CULTURE: Culture: 10000 — AB

## 2022-10-26 ENCOUNTER — Telehealth: Payer: Self-pay

## 2022-10-26 LAB — URINE CULTURE

## 2022-10-26 MED ORDER — SULFAMETHOXAZOLE-TRIMETHOPRIM 800-160 MG PO TABS: 1.0000 | ORAL_TABLET | Freq: Two times a day (BID) | ORAL | 0 refills | Status: AC

## 2022-10-26 NOTE — Telephone Encounter (Signed)
Per protocol, pt to d/c Macrobid and start Bactrim. Rx sent to pharmacy on file. Attempted to reach patient x1. LVM.

## 2022-10-28 ENCOUNTER — Encounter: Payer: Self-pay | Admitting: Pharmacist

## 2022-10-28 ENCOUNTER — Other Ambulatory Visit: Payer: Self-pay

## 2022-10-30 ENCOUNTER — Other Ambulatory Visit: Payer: Self-pay

## 2022-11-17 ENCOUNTER — Other Ambulatory Visit (HOSPITAL_BASED_OUTPATIENT_CLINIC_OR_DEPARTMENT_OTHER): Payer: Self-pay

## 2022-11-26 ENCOUNTER — Encounter (HOSPITAL_COMMUNITY): Payer: Self-pay | Admitting: Emergency Medicine

## 2022-11-26 ENCOUNTER — Emergency Department (HOSPITAL_COMMUNITY)
Admission: EM | Admit: 2022-11-26 | Discharge: 2022-11-26 | Disposition: A | Discharge status: Home / Self Care | Payer: Medicaid Other | Attending: Emergency Medicine | Admitting: Emergency Medicine

## 2022-11-26 ENCOUNTER — Other Ambulatory Visit: Payer: Self-pay

## 2022-11-26 DIAGNOSIS — E119 Type 2 diabetes mellitus without complications: Secondary | ICD-10-CM | POA: Diagnosis not present

## 2022-11-26 DIAGNOSIS — M5442 Lumbago with sciatica, left side: Secondary | ICD-10-CM | POA: Insufficient documentation

## 2022-11-26 DIAGNOSIS — R200 Anesthesia of skin: Secondary | ICD-10-CM | POA: Diagnosis not present

## 2022-11-26 DIAGNOSIS — N3000 Acute cystitis without hematuria: Secondary | ICD-10-CM

## 2022-11-26 DIAGNOSIS — Z7984 Long term (current) use of oral hypoglycemic drugs: Secondary | ICD-10-CM | POA: Diagnosis not present

## 2022-11-26 DIAGNOSIS — R350 Frequency of micturition: Secondary | ICD-10-CM | POA: Diagnosis not present

## 2022-11-26 DIAGNOSIS — M545 Low back pain, unspecified: Secondary | ICD-10-CM | POA: Diagnosis present

## 2022-11-26 LAB — URINALYSIS, ROUTINE W REFLEX MICROSCOPIC
Bilirubin Urine: NEGATIVE
Glucose, UA: NEGATIVE mg/dL
Ketones, ur: NEGATIVE mg/dL
Nitrite: NEGATIVE
Protein, ur: 100 mg/dL — AB
Specific Gravity, Urine: 1.029 (ref 1.005–1.030)
WBC, UA: >50 WBC/hpf (ref 0–5)
pH: 5 (ref 5.0–8.0)

## 2022-11-26 LAB — PREGNANCY, URINE: Preg Test, Ur: NEGATIVE

## 2022-11-26 MED ORDER — NAPROXEN 500 MG PO TABS: 500.0000 mg | ORAL_TABLET | Freq: Two times a day (BID) | ORAL | 0 refills | Status: AC

## 2022-11-26 MED ORDER — SULFAMETHOXAZOLE-TRIMETHOPRIM 800-160 MG PO TABS: 1.0000 | ORAL_TABLET | Freq: Two times a day (BID) | ORAL | 0 refills | Status: DC

## 2022-11-26 MED ORDER — LIDOCAINE 5 % EX PTCH: 1.0000 | MEDICATED_PATCH | CUTANEOUS | 0 refills | Status: DC

## 2022-11-26 MED ORDER — KETOROLAC TROMETHAMINE 30 MG/ML IJ SOLN
30.0000 mg | Freq: Once | INTRAMUSCULAR | Status: AC
  Administered 2022-11-26: 30 mg via INTRAMUSCULAR
  Filled 2022-11-26: qty 1

## 2022-11-26 NOTE — ED Triage Notes (Signed)
Pt reports left side back pain that radiates down the posterior left leg when standing.

## 2022-11-26 NOTE — Discharge Instructions (Addendum)
You were seen in the emergency department today for back pain.   Rest, gentle exercise and stretching will aid in recovery from any injuries to your back.  Using medication such as Tylenol and ibuprofen will help alleviate pain as well as decrease swelling and inflammation associated with these injuries. You may use 600 mg ibuprofen every 6 hours or 1000 mg of Tylenol every 6 hours.  You may choose to alternate between the 2.  This would be most effective.  Not to exceed 4000 g of Tylenol within 24 hours.  Not to exceed 3200 mg ibuprofen 24 hours.  Salt water/Epson salt soaks, massage, icy hot/Biofreeze/BenGay and other similar products can help with symptoms.  Please return to the emergency department for reevaluation if you denies any new or concerning symptoms such as fever, new numbness or weakness in your legs, difficulty using the restroom or incontinence.   you also u have a urinary tract infection, take the antibiotics as prescribed.

## 2022-11-26 NOTE — ED Provider Notes (Signed)
Care assumed from previous provider.  See note for full HPI.  In summation 37 year old here for evaluation of lower back pain on the left.  He was on her posterior leg, worse with movement.  Also with some dysuria.  No recent falls or injuries.  No chest pain, shortness of breath, abdominal pain, swelling to extremities.  Was seen by previous provider.  Likely musculoskeletal in etiology.  Do not feel she needs imaging.  She is pending labs and pregnancy test.  Once results plan to DC home.  Previous provider did not think patient needed imaging. Physical Exam  BP 126/88   Pulse 100   Temp 98.1 F (36.7 C)   Resp 15   SpO2 100%   Physical Exam Vitals and nursing note reviewed.  Constitutional:      General: She is not in acute distress.    Appearance: She is well-developed. She is not ill-appearing or diaphoretic.  HENT:     Head: Atraumatic.  Eyes:     Pupils: Pupils are equal, round, and reactive to light.  Cardiovascular:     Rate and Rhythm: Normal rate and regular rhythm.  Pulmonary:     Effort: Pulmonary effort is normal. No respiratory distress.  Abdominal:     General: There is no distension.     Palpations: Abdomen is soft.  Musculoskeletal:        General: Normal range of motion.     Cervical back: Normal range of motion and neck supple.  Skin:    General: Skin is warm and dry.  Neurological:     General: No focal deficit present.     Mental Status: She is alert and oriented to person, place, and time.     Procedures  Procedures Labs Reviewed  URINALYSIS, ROUTINE W REFLEX MICROSCOPIC - Abnormal; Notable for the following components:      Result Value   APPearance CLOUDY (*)    Hgb urine dipstick SMALL (*)    Protein, ur 100 (*)    Leukocytes,Ua LARGE (*)    Bacteria, UA RARE (*)    All other components within normal limits  PREGNANCY, URINE   No results found.  ED Course / MDM   Clinical Course as of 11/26/22 1714  Wed Nov 26, 2022  1507 Lower back  pain, no falls, into left leg. Some Urinary freq. FU on UA, preg. Send home with naproxen, lido patches [BH]    Clinical Course User Index [BH] Anasophia Pecor A, PA-C   Care assumed from previous provider.  See note for full HPI.  In summation 37 year old here for evaluation of lower back pain on the left.  He was on her posterior leg, worse with movement.  Also with some dysuria.  No recent falls or injuries.  No chest pain, shortness of breath, abdominal pain, swelling to extremities.  Was seen by previous provider.  Likely musculoskeletal in etiology.  Do not feel she needs imaging.  She is pending labs and pregnancy test.  Once results plan to DC home.  Previous provider did not think patient needed imaging.  Labs personally viewed and interpreted:  Pregnancy test negative UA positive for UTI  Patient reassessed.  Hemodynamically stable.  Will DC home with medications for likely musculoskeletal back pain as well as UTI/pyelonephritis.  Low suspicion for infected kidney stone, abscess, acute neurosurgical emergency such as cauda equina, discitis, osteomyelitis, transverse myelitis, psoas abscess.  Do not feel she needs labs or imaging at this time.  Of note patient states she has anaphylaxis to penicillins.  She has tolerated Bactrim previously.  Will write for such.   The patient has been appropriately medically screened and/or stabilized in the ED. I have low suspicion for any other emergent medical condition which would require further screening, evaluation or treatment in the ED or require inpatient management.  Patient is hemodynamically stable and in no acute distress.  Patient able to ambulate in department prior to ED.  Evaluation does not show acute pathology that would require ongoing or additional emergent interventions while in the emergency department or further inpatient treatment.  I have discussed the diagnosis with the patient and answered all questions.  Pain is been managed  while in the emergency department and patient has no further complaints prior to discharge.  Patient is comfortable with plan discussed in room and is stable for discharge at this time.  I have discussed strict return precautions for returning to the emergency department.  Patient was encouraged to follow-up with PCP/specialist refer to at discharge.     Medical Decision Making Amount and/or Complexity of Data Reviewed Independent Historian: friend External Data Reviewed: labs and notes. Labs: ordered. Decision-making details documented in ED Course.  Risk OTC drugs. Prescription drug management. Parenteral controlled substances. Decision regarding hospitalization. Diagnosis or treatment significantly limited by social determinants of health.            Racine Erby A, PA-C 11/26/22 1714    Arby Barrette, MD 12/02/22 1453

## 2022-11-26 NOTE — ED Provider Notes (Signed)
[facility] AT [facility] Provider Note   CSN: [id number] Arrival date & time: 11/26/22  1345     History  Chief Complaint  Patient presents with   Back Pain    Christina Morton is a 37 y.o. female with history of anemia, type 2 diabetes, depression, glaucoma, who presents to the emergency department who complains of back pain.  Localizes it to the left lower back.  She states that it started when she woke up 1 morning, no known injury.  Pain intermittently radiates down her left leg, with some associated numbness.  No pain or numbness in her groin.  It has been going on for several days.  She did try Tylenol once, without relief, so she stopped taking it.  Has not tried anything else.  She also notes that she has had some urinary frequency, no dysuria.  No urinary retention, urine or bowel incontinence.  No fever or chills, or abdominal pain.   Back Pain Associated symptoms: numbness   Associated symptoms: no fever        Home Medications Prior to Admission medications   Medication Sig Start Date End Date Taking? Authorizing Provider  amphetamine-dextroamphetamine (ADDERALL) 10 MG tablet Up to 3 tabs daily as needed 04/29/22   Jetty Duhamel D, MD  Cholecalciferol (VITAMIN D3 ADULT GUMMIES) 25 MCG (1000 UT) CHEW 1 tablet    [provider]  famotidine (PEPCID) 20 MG tablet Take 1 tablet (20 mg total) by mouth at bedtime. 12/08/18   Shirley, [place], DO  FLUoxetine (PROZAC) 10 MG capsule 1 capsule    [provider]  glipiZIDE (GLUCOTROL XL) 10 MG 24 hr tablet Take 10 mg by mouth daily. 08/13/20   [provider]  levonorgestrel (MIRENA) 20 MCG/DAY IUD by Intrauterine route.    [provider]  modafinil (PROVIGIL) 200 MG tablet 1 each morning for alertness 02/26/22   Jetty Duhamel D, MD  nitrofurantoin, macrocrystal-monohydrate, (MACROBID) 100 MG capsule Take 1 capsule (100 mg total) by mouth 2 (two) times daily. 10/24/22    Zenia Resides, MD  rizatriptan (MAXALT) 10 MG tablet Take 1 tablet earliest onset of migraine.  May repeat in 2 hours if needed.  Maximum 2 tablets in 24 hours 04/08/19   Shon Millet R, DO  rosuvastatin (CRESTOR) 10 MG tablet Take 1 tablet by mouth daily. 11/21/20   [provider]  Semaglutide, 2 MG/DOSE, (OZEMPIC, 2 MG/DOSE,) 8 MG/3ML SOPN Inject 2 mg into the skin once a week. 02/07/22     tirzepatide (MOUNJARO) 10 MG/0.5ML Pen Inject 10 mg into the skin once a week. 09/04/22     tirzepatide (MOUNJARO) 10 MG/0.5ML Pen Inject 10 mg into the skin once a week. 10/15/22     topiramate (TOPAMAX) 50 MG tablet Take 25mg  at hs for one week and then 50mg  at thereforth at hs 08/11/19   Talbert Forest, [place], DO  zolpidem (AMBIEN) 5 MG tablet Take 1 tablet (5 mg total) by mouth at bedtime. 05/04/19 07/09/19  Nestor Ramp, MD      Allergies    Amoxicillin, Penicillins, and Clindamycin/lincomycin    Review of Systems   Review of Systems  Constitutional:  Negative for chills and fever.  Genitourinary:  Positive for frequency.  Musculoskeletal:  Positive for back pain.  Neurological:  Positive for numbness.  All other systems reviewed and are negative.   Physical Exam Updated Vital Signs BP 126/88   Pulse 100   Temp 98.1 F (  36.7 C)   Resp 15   SpO2 100%  Physical Exam Vitals and nursing note reviewed.  Constitutional:      Appearance: Normal appearance.  HENT:     Head: Normocephalic and atraumatic.  Eyes:     Conjunctiva/sclera: Conjunctivae normal.  Pulmonary:     Effort: Pulmonary effort is normal. No respiratory distress.  Musculoskeletal:     Comments: Left lower lumbar muscular tenderness to palpation.  No midline spinal tenderness, step-offs or crepitus.  Strength 5/5 in all extremities.  Sensation intact in all extremities.  Skin:    General: Skin is warm and dry.  Neurological:     Mental Status: She is alert.  Psychiatric:        Mood and Affect: Mood normal.         Behavior: Behavior normal.     ED Results / Procedures / Treatments   Labs (all labs ordered are listed, but only abnormal results are displayed) Labs Reviewed  URINALYSIS, ROUTINE W REFLEX MICROSCOPIC  PREGNANCY, URINE    EKG None  Radiology No results found.  Procedures Procedures    Medications Ordered in ED Medications  ketorolac (TORADOL) 30 MG/ML injection 30 mg (30 mg Intramuscular Given 11/26/22 1503)    ED Course/ Medical Decision Making/ A&P Clinical Course as of 11/26/22 1513  Wed Nov 26, 2022  1507 Lower back pain, no falls, into left leg. Some Urinary freq. FU on UA, preg. Send home with naproxen, lido patches [BH]    Clinical Course User Index [BH] Henderly, Britni A, PA-C                                 Medical Decision Making Amount and/or Complexity of Data Reviewed Labs: ordered.  Risk Prescription drug management.  This patient is a 37 y.o. female who presents to the ED for concern of left lower back pain x several days.   Differential diagnoses prior to evaluation: Fracture (acute/chronic), muscle strain, cauda equina, spinal stenosis, DDD, ligamentous injury, disk herniation, metastatic cancer, vertebral osteomyelitis, kidney stone, pyelonephritis, AAA, pancreatitis, bowel obstruction, meningitis.  Past Medical History / Social History / Additional history: Chart reviewed. Pertinent results include: anemia, type 2 diabetes, depression, glaucoma  Physical Exam: Physical exam performed. The pertinent findings include: Left lumbar muscular tenderness to palpation, no midline spinal tenderness, step offs or crepitus.   Medications / Treatment: Given toradol  Discussed how I have very low clinical suspicion for fractures/dislocations, will defer spinal imaging at this time.    Disposition: Patient discussed and care transferred to [facility] PA-C at shift change. Please see his/her note for further details regarding further ED course and  disposition. Plan at time of handoff is follow up on UA to r/o back pain from UTI. Anticipate d/c to home with anti-inflammatories and follow up with PCP.    Final Clinical Impression(s) / ED Diagnoses Final diagnoses:  Acute left-sided low back pain with left-sided sciatica  Urinary frequency    Rx / DC Orders ED Discharge Orders     None      Portions of this report may have been transcribed using voice recognition software. Every effort was made to ensure accuracy; however, inadvertent computerized transcription errors may be present.    Jeanella Flattery 11/26/22 1513    Gwyneth Sprout, MD 11/27/22 [phone]

## 2022-12-01 ENCOUNTER — Telehealth: Payer: Medicaid Other | Admitting: Physician Assistant

## 2022-12-01 DIAGNOSIS — R3989 Other symptoms and signs involving the genitourinary system: Secondary | ICD-10-CM | POA: Diagnosis not present

## 2022-12-01 MED ORDER — CIPROFLOXACIN HCL 500 MG PO TABS
500.0000 mg | ORAL_TABLET | Freq: Two times a day (BID) | ORAL | 0 refills | Status: AC
Start: 2022-12-01 — End: 2022-12-06

## 2022-12-01 MED ORDER — PHENAZOPYRIDINE HCL 100 MG PO TABS
100.0000 mg | ORAL_TABLET | Freq: Three times a day (TID) | ORAL | 0 refills | Status: AC | PRN
Start: 2022-12-01 — End: ?

## 2022-12-01 NOTE — Progress Notes (Signed)
Virtual Visit Consent   Christina Morton, you are scheduled for a virtual visit with a [facility] provider today. Just as with appointments in the office, your consent must be obtained to participate. Your consent will be active for this visit and any virtual visit you may have with one of our providers in the next 365 days. If you have a MyChart account, a copy of this consent can be sent to you electronically.  As this is a virtual visit, video technology does not allow for your provider to perform a traditional examination. This may limit your provider's ability to fully assess your condition. If your provider identifies any concerns that need to be evaluated in person or the need to arrange testing (such as labs, EKG, etc.), we will make arrangements to do so. Although advances in technology are sophisticated, we cannot ensure that it will always work on either your end or our end. If the connection with a video visit is poor, the visit may have to be switched to a telephone visit. With either a video or telephone visit, we are not always able to ensure that we have a secure connection.  By engaging in this virtual visit, you consent to the provision of healthcare and authorize for your insurance to be billed (if applicable) for the services provided during this visit. Depending on your insurance coverage, you may receive a charge related to this service.  I need to obtain your verbal consent now. Are you willing to proceed with your visit today? Christina Morton has provided verbal consent on 12/01/2022 for a virtual visit (video or telephone). Christina Loveless, PA-C  Date: 12/01/2022 9:11 AM  Virtual Visit via Video Note   I, Christina Morton, connected with  Christina Morton  ([id number], 1985/08/17) on 12/01/22 at  9:15 AM EDT by a video-enabled telemedicine application and verified that I am speaking with the correct person using two identifiers.  Location: Patient: Virtual Visit Location  Patient: Home Provider: Virtual Visit Location Provider: Home Office   I discussed the limitations of evaluation and management by telemedicine and the availability of in person appointments. The patient expressed understanding and agreed to proceed.    History of Present Illness: Christina Morton is a 37 y.o. who identifies as a female who was assigned female at birth, and is being seen today for possible UTI.  HPI: Urinary Tract Infection  This is a recurrent problem. The current episode started in the past 7 days (Seen in person on 11/26/22 for similar symptoms; Started on Bactrim, no culture. No improvement. On 10/24/22, similar symptoms, started on Macrobid, culture revealed P. mirabilis and was resistant to Macrobid. Pt advised to stop Macrobid and start Bactrim). The problem occurs every urination. The problem has been gradually worsening. The quality of the pain is described as burning and aching. The pain is mild. There has been no fever. Associated symptoms include frequency, hesitancy and urgency. Pertinent negatives include no chills, discharge, flank pain, hematuria, nausea or vomiting. She has tried antibiotics (Bactrim) for the symptoms. The treatment provided no relief. Her past medical history is significant for recurrent UTIs.     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 Yes Did it involve sudden or severe rash/hives, skin peeling, or any reaction on the inside of your mouth or nose? No Did you need to seek medical attention at a hospital or doctor's office? Yes When did it last happen?      childhood allergy If all above answers are "NO", may proceed with cephalosporin use.   Penicillins Anaphylaxis    Did it involve swelling of the face/tongue/throat, SOB, or low BP? Yes Did it involve sudden or severe rash/hives, skin peeling, or any reaction on the inside of your mouth or nose? No Did you need to seek medical attention at a hospital or doctor's office? Yes When did it last happen?      childhood allergy If all above answers are "NO", may proceed with cephalosporin use.    Clindamycin/Lincomycin Diarrhea   Medications:  Current Outpatient Medications:    amphetamine-dextroamphetamine (ADDERALL) 10 MG tablet, Up to 3 tabs daily as needed, Disp: 90 tablet, Rfl: 0   Cholecalciferol (VITAMIN D3 ADULT GUMMIES) 25 MCG (1000 UT) CHEW, 1 tablet, Disp: , Rfl:    ciprofloxacin (CIPRO) 500 MG tablet, Take 1 tablet (500 mg total) by mouth 2 (two) times daily for 5 days., Disp: 10 tablet, Rfl: 0   famotidine (PEPCID) 20 MG tablet, Take 1 tablet (20 mg total) by mouth at bedtime., Disp: 30 tablet, Rfl: 1   FLUoxetine (PROZAC) 10 MG capsule, 1 capsule, Disp: , Rfl:    glipiZIDE (GLUCOTROL XL) 10 MG 24 hr tablet, Take 10 mg by mouth daily., Disp: , Rfl:    levonorgestrel (MIRENA) 20 MCG/DAY IUD, by Intrauterine route., Disp: , Rfl:    lidocaine (LIDODERM) 5 %, Place 1 patch onto the skin daily. Remove & Discard patch within 12 hours or as directed by MD, Disp: 30 patch, Rfl: 0   modafinil (PROVIGIL) 200 MG tablet, 1 each morning for alertness, Disp: 30 tablet, Rfl: 0   naproxen (NAPROSYN) 500 MG tablet, Take 1 tablet (500 mg total) by mouth 2 (two) times daily., Disp: 30 tablet, Rfl: 0   phenazopyridine (PYRIDIUM) 100 MG tablet, Take 1 tablet (100 mg total) by  mouth 3 (three) times daily as needed for pain., Disp: 10 tablet, Rfl: 0   rizatriptan (MAXALT) 10 MG tablet, Take 1 tablet earliest onset of migraine.  May repeat in 2 hours if needed.  Maximum 2 tablets in 24 hours, Disp: 10 tablet, Rfl: 3   rosuvastatin (CRESTOR) 10 MG tablet, Take 1 tablet by mouth daily., Disp: , Rfl:    Semaglutide, 2 MG/DOSE, (OZEMPIC, 2 MG/DOSE,) 8 MG/3ML SOPN, Inject 2 mg into the skin once a week., Disp: 3 mL, Rfl: 0   tirzepatide (MOUNJARO) 10 MG/0.5ML Pen, Inject 10 mg into the skin once a week., Disp: 2 mL, Rfl: 1   tirzepatide (MOUNJARO) 10 MG/0.5ML Pen, Inject 10 mg into the skin once a week., Disp: 2 mL, Rfl: 1   topiramate (TOPAMAX) 50 MG tablet, Take 25mg  at hs for one week and then 50mg  at thereforth at hs, Disp: , Rfl:   Observations/Objective: Patient is well-developed, well-nourished in no acute distress.  Resting comfortably at home.  Head is normocephalic, atraumatic.  No labored breathing.  Speech is clear and coherent with logical content.  Patient is alert and oriented at baseline.    Assessment and Plan: 1. Suspected UTI - ciprofloxacin (CIPRO) 500 MG tablet; Take 1 tablet (500 mg total) by mouth 2 (two) times daily for 5 days.  Dispense: 10 tablet; Refill: 0 - phenazopyridine (PYRIDIUM) 100 MG tablet; Take 1 tablet (100 mg total) by mouth 3 (three) times daily as needed for pain.  Dispense: 10 tablet; Refill: 0  - Worsening symptoms.  - Will treat empirically with Cipro (cannot take Keflex due to h/o anaphylaxis with PCN, h/o resistance with Macrobid, Failed Bactrim) - May use Pyridium for bladder spasms - Continue to push fluids.  - Seek in person evaluation for urine culture if symptoms do not improve or if they worsen.    Follow Up Instructions: I discussed the assessment and treatment plan with the patient. The patient was provided an opportunity to ask questions and all were answered. The patient agreed with the plan and demonstrated an  understanding of the instructions.  A copy of instructions were sent to the patient via MyChart unless otherwise noted below.    The patient was advised to call back or seek an in-person evaluation if the symptoms worsen or if the condition fails to improve as anticipated.  Time:  I spent 10 minutes with the patient via telehealth technology discussing the above problems/concerns.    Christina Loveless, PA-C

## 2022-12-01 NOTE — Patient Instructions (Signed)
Sherrie Mustache, thank you for joining [name], PA-C for today's virtual visit.  While this provider is not your primary care provider (PCP), if your PCP is located in our provider database this encounter information will be shared with them immediately following your visit.   A [facility] MyChart account gives you access to today's visit and all your visits, tests, and labs performed at [facility] " click here if you don't have a [facility] MyChart account or go to [email or web address]  Consent: (Patient) Christina Morton provided verbal consent for this virtual visit at the beginning of the encounter.  Current Medications:  Current Outpatient Medications:    amphetamine-dextroamphetamine (ADDERALL) 10 MG tablet, Up to 3 tabs daily as needed, Disp: 90 tablet, Rfl: 0   Cholecalciferol (VITAMIN D3 ADULT GUMMIES) 25 MCG (1000 UT) CHEW, 1 tablet, Disp: , Rfl:    ciprofloxacin (CIPRO) 500 MG tablet, Take 1 tablet (500 mg total) by mouth 2 (two) times daily for 5 days., Disp: 10 tablet, Rfl: 0   famotidine (PEPCID) 20 MG tablet, Take 1 tablet (20 mg total) by mouth at bedtime., Disp: 30 tablet, Rfl: 1   FLUoxetine (PROZAC) 10 MG capsule, 1 capsule, Disp: , Rfl:    glipiZIDE (GLUCOTROL XL) 10 MG 24 hr tablet, Take 10 mg by mouth daily., Disp: , Rfl:    levonorgestrel (MIRENA) 20 MCG/DAY IUD, by Intrauterine route., Disp: , Rfl:    lidocaine (LIDODERM) 5 %, Place 1 patch onto the skin daily. Remove & Discard patch within 12 hours or as directed by MD, Disp: 30 patch, Rfl: 0   modafinil (PROVIGIL) 200 MG tablet, 1 each morning for alertness, Disp: 30 tablet, Rfl: 0   naproxen (NAPROSYN) 500 MG tablet, Take 1 tablet (500 mg total) by mouth 2 (two) times daily., Disp: 30 tablet, Rfl: 0   phenazopyridine (PYRIDIUM) 100 MG tablet, Take 1 tablet (100 mg total) by mouth 3 (three) times daily as needed for pain., Disp: 10 tablet, Rfl: 0   rizatriptan (MAXALT) 10 MG tablet, Take 1  tablet earliest onset of migraine.  May repeat in 2 hours if needed.  Maximum 2 tablets in 24 hours, Disp: 10 tablet, Rfl: 3   rosuvastatin (CRESTOR) 10 MG tablet, Take 1 tablet by mouth daily., Disp: , Rfl:    Semaglutide, 2 MG/DOSE, (OZEMPIC, 2 MG/DOSE,) 8 MG/3ML SOPN, Inject 2 mg into the skin once a week., Disp: 3 mL, Rfl: 0   tirzepatide (MOUNJARO) 10 MG/0.5ML Pen, Inject 10 mg into the skin once a week., Disp: 2 mL, Rfl: 1   tirzepatide (MOUNJARO) 10 MG/0.5ML Pen, Inject 10 mg into the skin once a week., Disp: 2 mL, Rfl: 1   topiramate (TOPAMAX) 50 MG tablet, Take 25mg  at hs for one week and then 50mg  at thereforth at hs, Disp: , Rfl:    Medications ordered in this encounter:  Meds ordered this encounter  Medications   ciprofloxacin (CIPRO) 500 MG tablet    Sig: Take 1 tablet (500 mg total) by mouth 2 (two) times daily for 5 days.    Dispense:  10 tablet    Refill:  0    Order Specific Question:   Supervising Provider    Answer:   Merrilee Jansky [id number]   phenazopyridine (PYRIDIUM) 100 MG tablet    Sig: Take 1 tablet (100 mg total) by mouth 3 (three) times daily as needed for pain.    Dispense:  10 tablet    Refill:  0    Order Specific Question:   Supervising Provider    Answer:   Merrilee Jansky [[id number]]     *If you need refills on other medications prior to your next appointment, please contact your pharmacy*  Follow-Up: Call back or seek an in-person evaluation if the symptoms worsen or if the condition fails to improve as anticipated.  [facility] [phone]  Other Instructions Urinary Tract Infection, Adult  A urinary tract infection (UTI) is an infection of any part of the urinary tract. The urinary tract includes the kidneys, ureters, bladder, and urethra. These organs make, store, and get rid of urine in the body. An upper UTI affects the ureters and kidneys. A lower UTI affects the bladder and urethra. What are the causes? Most urinary  tract infections are caused by bacteria in your genital area around your urethra, where urine leaves your body. These bacteria grow and cause inflammation of your urinary tract. What increases the risk? You are more likely to develop this condition if: You have a urinary catheter that stays in place. You are not able to control when you urinate or have a bowel movement (incontinence). You are female and you: Use a spermicide or diaphragm for birth control. Have low estrogen levels. Are pregnant. You have certain genes that increase your risk. You are sexually active. You take antibiotic medicines. You have a condition that causes your flow of urine to slow down, such as: An enlarged prostate, if you are female. Blockage in your urethra. A kidney stone. A nerve condition that affects your bladder control (neurogenic bladder). Not getting enough to drink, or not urinating often. You have certain medical conditions, such as: Diabetes. A weak disease-fighting system (immunesystem). Sickle cell disease. Gout. Spinal cord injury. What are the signs or symptoms? Symptoms of this condition include: Needing to urinate right away (urgency). Frequent urination. This may include small amounts of urine each time you urinate. Pain or burning with urination. Blood in the urine. Urine that smells bad or unusual. Trouble urinating. Cloudy urine. Vaginal discharge, if you are female. Pain in the abdomen or the lower back. You may also have: Vomiting or a decreased appetite. Confusion. Irritability or tiredness. A fever or chills. Diarrhea. The first symptom in older adults may be confusion. In some cases, they may not have any symptoms until the infection has worsened. How is this diagnosed? This condition is diagnosed based on your medical history and a physical exam. You may also have other tests, including: Urine tests. Blood tests. Tests for STIs (sexually transmitted infections). If  you have had more than one UTI, a cystoscopy or imaging studies may be done to determine the cause of the infections. How is this treated? Treatment for this condition includes: Antibiotic medicine. Over-the-counter medicines to treat discomfort. Drinking enough water to stay hydrated. If you have frequent infections or have other conditions such as a kidney stone, you may need to see a health care provider who specializes in the urinary tract (urologist). In rare cases, urinary tract infections can cause sepsis. Sepsis is a life-threatening condition that occurs when the body responds to an infection. Sepsis is treated in the hospital with IV antibiotics, fluids, and other medicines. Follow these instructions at home:  Medicines Take over-the-counter and prescription medicines only as told by your health care provider. If you were prescribed an antibiotic medicine, take it as told by your health care provider. Do not stop using the antibiotic even if  you start to feel better. General instructions Make sure you: Empty your bladder often and completely. Do not hold urine for long periods of time. Empty your bladder after sex. Wipe from front to back after urinating or having a bowel movement if you are female. Use each tissue only one time when you wipe. Drink enough fluid to keep your urine pale yellow. Keep all follow-up visits. This is important. Contact a health care provider if: Your symptoms do not get better after 1-2 days. Your symptoms go away and then return. Get help right away if: You have severe pain in your back or your lower abdomen. You have a fever or chills. You have nausea or vomiting. Summary A urinary tract infection (UTI) is an infection of any part of the urinary tract, which includes the kidneys, ureters, bladder, and urethra. Most urinary tract infections are caused by bacteria in your genital area. Treatment for this condition often includes antibiotic  medicines. If you were prescribed an antibiotic medicine, take it as told by your health care provider. Do not stop using the antibiotic even if you start to feel better. Keep all follow-up visits. This is important. This information is not intended to replace advice given to you by your health care provider. Make sure you discuss any questions you have with your health care provider. Document Revised: 11/13/2019 Document Reviewed: 11/18/2019 Elsevier Patient Education  2024 Elsevier Inc.    If you have been instructed to have an in-person evaluation today at a local Urgent Care facility, please use the link below. It will take you to a list of all of our available [facility], including address, phone number and hours of operation. Please do not delay care.  [facility]  If you or a family member do not have a primary care provider, use the link below to schedule a visit and establish care. When you choose a [facility] primary care physician or advanced practice provider, you gain a long-term partner in health. Find a Primary Care Provider  Learn more about [facility]'s in-office and virtual care options:  - Get Care Now

## 2022-12-06 DIAGNOSIS — M545 Low back pain, unspecified: Secondary | ICD-10-CM | POA: Diagnosis present

## 2022-12-06 DIAGNOSIS — M5442 Lumbago with sciatica, left side: Secondary | ICD-10-CM | POA: Insufficient documentation

## 2022-12-06 DIAGNOSIS — E119 Type 2 diabetes mellitus without complications: Secondary | ICD-10-CM | POA: Diagnosis not present

## 2022-12-07 ENCOUNTER — Other Ambulatory Visit: Payer: Self-pay

## 2022-12-07 ENCOUNTER — Emergency Department (HOSPITAL_COMMUNITY): Payer: Medicaid Other

## 2022-12-07 ENCOUNTER — Emergency Department (HOSPITAL_COMMUNITY)
Admission: EM | Admit: 2022-12-07 | Discharge: 2022-12-07 | Disposition: A | Discharge status: Home / Self Care | Payer: Medicaid Other | Attending: Emergency Medicine | Admitting: Emergency Medicine

## 2022-12-07 ENCOUNTER — Emergency Department (HOSPITAL_BASED_OUTPATIENT_CLINIC_OR_DEPARTMENT_OTHER): Payer: Medicaid Other

## 2022-12-07 DIAGNOSIS — M79662 Pain in left lower leg: Secondary | ICD-10-CM

## 2022-12-07 DIAGNOSIS — M5432 Sciatica, left side: Secondary | ICD-10-CM

## 2022-12-07 LAB — PREGNANCY, URINE: Preg Test, Ur: NEGATIVE

## 2022-12-07 MED ORDER — DEXAMETHASONE SODIUM PHOSPHATE 10 MG/ML IJ SOLN
10.0000 mg | Freq: Once | INTRAMUSCULAR | Status: AC
  Administered 2022-12-07: 10 mg via INTRAMUSCULAR
  Filled 2022-12-07: qty 1

## 2022-12-07 MED ORDER — METHOCARBAMOL 500 MG PO TABS: 500.0000 mg | ORAL_TABLET | Freq: Three times a day (TID) | ORAL | 0 refills | Status: DC | PRN

## 2022-12-07 NOTE — Progress Notes (Signed)
LLE venous duplex has been completed.  Preliminary results given to Dr. Jacqulyn Bath.   Results can be found under chart review under CV PROC. 12/07/2022 10:10 AM Auriah Hollings RVT, RDMS

## 2022-12-07 NOTE — ED Provider Notes (Signed)
Emergency Department Provider Note   I have reviewed the triage vital signs and the nursing notes.   HISTORY  Chief Complaint Leg Pain   HPI Christina Morton is a 37 y.o. female returns to the emergency department for evaluation of low back and left leg pain.  She states she has soreness in her upper leg and down the back of the left leg.  She came to the emergency department on 8/7 for similar symptoms.  She received steroid shot and has been taking naproxen with the relief in symptoms.  She has had progressive worsening discomfort with occasional numbness in the leg and some perceived weakness.  States that the leg feels like it wants to "give out" when walking.  No groin numbness.  No bowel or bladder incontinence.  No retention.  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 Respiratory: Denies shortness of breath. Gastrointestinal: No abdominal pain.  Genitourinary: Negative for dysuria. Musculoskeletal: Positive for back pain. Skin: Negative for rash. Neurological: Negative for headaches. Intermittent numbness to the left leg and weakness.   ____________________________________________   PHYSICAL EXAM:  VITAL SIGNS: ED Triage Vitals  Encounter Vitals Group     BP 12/07/22 0031 120/84     Pulse Rate 12/07/22 0031 85     Resp 12/07/22 0031 18     Temp 12/07/22 0031 99 F (37.2 C)     Temp Source 12/07/22 0031 Oral     SpO2 12/07/22 0031 100 %     Weight 12/07/22 0031 230 lb (104.3 kg)     Height 12/07/22 0031 5\' 8"   (1.727 m)   Constitutional: Alert and oriented. Well appearing and in no acute distress. Eyes: Conjunctivae are normal.  Head: Atraumatic. Nose: No congestion/rhinnorhea. Mouth/Throat: Mucous membranes are moist.  Neck: No stridor.   Cardiovascular: Normal rate, regular rhythm. Good peripheral circulation. Grossly normal heart sounds.   Respiratory: Normal respiratory effort.  No retractions. Lungs CTAB. Gastrointestinal: Soft and nontender. No distention.  Musculoskeletal: No lower extremity tenderness nor edema. No gross deformities of extremities. Neurologic:  Normal speech and language. No gross focal neurologic deficits are appreciated.  Decree strength in the left lower extremity, possibly secondary to pain. Normal sensation throughout.  Skin:  Skin is warm, dry and intact. No rash noted.  ____________________________________________   LABS (all labs ordered are listed, but only abnormal results are displayed)  Labs Reviewed - No data to display ____________________________________________  EKG  *** ____________________________________________  RADIOLOGY  No results found.  ____________________________________________   PROCEDURES  Procedure(s) performed:   Procedures   ____________________________________________   INITIAL IMPRESSION / ASSESSMENT AND PLAN / ED COURSE  Pertinent labs & imaging results that were available during my care of the patient were reviewed by me and considered in my medical decision making (see chart for details).   This patient is Presenting for Evaluation of back pain, which does require a range of treatment options, and is a complaint that involves a high risk of morbidity and mortality.  The Differential Diagnoses includes but is not exclusive to musculoskeletal back pain, renal colic, urinary tract infection, pyelonephritis, intra-abdominal causes of back pain, aortic aneurysm or dissection, cauda equina syndrome, sciatica, lumbar  disc disease, thoracic disc disease, etc.   Critical Interventions-    Medications  dexamethasone (DECADRON) injection 10 mg (10 mg Intramuscular Given 12/07/22 0131)    Reassessment after intervention:    I decided to review pertinent External Data, and in summary patient was seen on 8/7. No imaging at that time.   Clinical Laboratory Tests Ordered, included   Radiologic Tests Ordered, included ***. I independently interpreted the images and agree with radiology interpretation.   Cardiac Monitor Tracing which shows NSR.    Social Determinants of Health Risk patient is not an active smoker.   Consult complete with  Medical Decision Making: Summary:  Patient presents emergency department for evaluation of left leg pain with some intermittent numbness and weakness.  Symptoms progressing since her ED visit on 8/7.  Plan for MRI lumbar spine although my overall suspicion for cauda equina or other acute spine emergency is lower. Patient received decadron shot prior to my evaluation.   Reevaluation with update and discussion with   ***Considered admission***  Patient's presentation is most consistent with acute presentation with potential threat to life or bodily function.   Disposition:   ____________________________________________  FINAL CLINICAL IMPRESSION(S) / ED DIAGNOSES  Final diagnoses:  None     NEW OUTPATIENT MEDICATIONS STARTED DURING THIS VISIT:  New Prescriptions   No medications on file    Note:  This document was prepared using Dragon voice recognition software and may include unintentional dictation errors.  Alona Bene, MD, [facility]

## 2022-12-07 NOTE — Discharge Instructions (Signed)
You have been seen in the Emergency Department (ED)  today for back pain.  Your workup and exam have not shown any acute abnormalities and you are likely suffering from muscle strain or possible problems with your discs, but there is no treatment that will fix your symptoms at this time.  Please take Motrin (ibuprofen) as needed for your pain according to the instructions written on the box.  Alternatively, for the next five days you can take 600mg three times daily with meals (it may upset your stomach).  Please follow up with your doctor as soon as possible regarding today's ED visit and your back pain.  Return to the ED for worsening back pain, fever, weakness or numbness of either leg, or if you develop either (1) an inability to urinate or have bowel movements, or (2) loss of your ability to control your bathroom functions (if you start having "accidents"), or if you develop other new symptoms that concern you.  

## 2022-12-07 NOTE — ED Triage Notes (Signed)
Patient coming to ED for evaluation of L leg pain.  States she was recently seen for same and dx with sciatica.  Reports pain has not improved and now "my leg feels heavy when I try to move."  No reports of recent injuries.  No urinary incontinents.

## 2022-12-07 NOTE — ED Notes (Signed)
Pt d/c with instructions, all questions answered. NAD.

## 2022-12-10 ENCOUNTER — Other Ambulatory Visit (HOSPITAL_BASED_OUTPATIENT_CLINIC_OR_DEPARTMENT_OTHER): Payer: Self-pay

## 2022-12-17 NOTE — Progress Notes (Unsigned)
12/10/21- 78 yoF former smoker with concern of EDS Medical problem list includes DM2, Obesity, Cervical Radiculopathy, Depression, Glaucoma, FE Anemia,  09/18/2021- Initial Eval by Clent Ridges, NP: Patient presents today for new sleep consult.  Patient reports symptoms of insomnia, fallas asleep while driving, restless sleep and exhausted. She had NPSG on 05/17/21 that showed no evidence of sleep apnea, average AHI 0.6/hr with SpO2 low 92%. She falls asleep very easily during the day. She is tired all the time. Her typicl bedtime varies, she try's to go to sleep by 11pm. Sometimes it will take her 1-4 hours to fall asleep. She wakes up on average once a night. She starts her day at 7:30 to help her son get ready for virtual home schooling. She will lay back down around 8am and it might take her until 9:30-10am to fall asleep and she will get an additional 1-2 hours of sleep. She has tried several sleep aids in the past including 5mg  Ambien, 2-4mg  Lunesta, melatonin and trazodone in the past without improvement.  She is currently being weaned off of Lunesta and is on 1 mg at bedtime.  She has used Benadryl in the past with some results however her eye doctor told her not to take this regularly due to her history of glaucoma.  Concern for possible narcolepsy.  No symptoms of cataplexy or sleepwalking. NPSG 05/17/21- AHI 0.6/hr, desaturation to 92%, body weight 250 lbs REM latency 41 minutes, REM 26.9% of TST NPSG 11/12/21- AHI 2.5/ hr, desaturation to 86%, body weight 250 lbs  REM latency 67.5 minutes, REM 27.4% of TST MSLT 11/13/21- Mean Sleep Latency 5.86 minutes, Slept on 5/5 naps, 1 SOREM (pt unsure about recent use of Prozac or Lunesta prior to this NPSG) Usual meds include Lunesta 1 mg, Prozac 10 mg,  Epworth score- Body weight today-256 lbs This married mother describes significant daytime sleepiness since early childhood.  Pattern is stable.  Needs naps but does not find them refreshing.  We discussed safety  responsibility while driving.  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  No similar family history.  Husband tells her she snores.  Not currently taking anything to help her difficulty with sleep onset, saying medications including most recently Lunesta did not help.  Once asleep she sleeps soundly.  1 cup of coffee once or twice a week.  Does not find it very helpful.  Denies pregnancy.  Thyroid function checked. We discussed her sleep studies which show no evidence of significant OSA.  Pathologic sleepiness demonstrated on MSLT not meeting the specific evidence of increased REM pressure usually required for a firm diagnosis of narcolepsy.  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  -----------------------------------------  04/29/22- 36 yoF former smoker followed for Narcolepsy, complicated by Obesity, Cervical Radiculopathy, Depression, Glaucoma, FE Anemia -Trazodone, clonazepaam, Adderall 20 XR bid,  modafinil 200, Body weight today-247 lbs Covid vax-no                                Flu vax-no Says neither adderall xr 20 or modafinil 200 is enough by itself, although modafinil was good for first few days. Together they were too much. Naps are necessary but unrefreshing. We are going to continue modafinil and let her try adjusting adderall 10 mg tabs. Denies chest pain, headache, palpitation. Denies pregnant.  12/18/22- 37 yoF former smoker followed for Narcolepsy,  complicated by Obesity, Cervical Radiculopathy, Depression, Glaucoma, FE Anemia -Ambien 5,  Adderall 10 x 3,  modafinil 200, Body weight today-244 lbs She reports that her psychiatrist, Dr. Tomasa Hose, has been trying to help her with her insomnia complaint and now he is using trazodone and clonazepam.  She was not sure of doses.  She still describes significant trouble sleeping at night even though she fights oppressive sleepiness during the day.   The 10 mg  Adderall tabs did not make enough difference, but she had said last time that the Adderall XR 20 mg with modafinil, was too much.  She makes quite a point of seeming very sleepy here at our office and I think we need to keep in mind the possibility of a conversion problem best managed by her psychiatrist.  She is seeking disability.   ROS-see HPI   + = positive Constitutional:    weight loss, night sweats, fevers, chills, +fatigue, lassitude. HEENT:    headaches, difficulty swallowing, tooth/dental problems, sore throat,       sneezing, itching, ear ache, nasal congestion, post nasal drip, snoring CV:    chest pain, orthopnea, PND, swelling in lower extremities, anasarca,                                   dizziness, palpitations Resp:   shortness of breath with exertion or at rest.                productive cough,   non-productive cough, coughing up of blood.              change in color of mucus.  wheezing.   Skin:    rash or lesions. GI:  No-   heartburn, indigestion, abdominal pain, nausea, vomiting, diarrhea,                 change in bowel habits, loss of appetite GU: dysuria, change in color of urine, no urgency or frequency.   flank pain. MS:   joint pain, stiffness, decreased range of motion, back pain. Neuro-     nothing unusual Psych:  change in mood or affect.  depression or anxiety.   memory loss.  OBJ- Physical Exam General- Wakes to voice, yawning, Oriented, Affect-calm, Distress- none acute, +overweight,  Skin- rash-none, lesions- none, excoriation- none Lymphadenopathy- none Head- atraumatic            Eyes- Gross vision intact, PERRLA, conjunctivae and secretions clear            Ears- Hearing, canals-normal            Nose- Clear, no-Septal dev, mucus, polyps, erosion, perforation             Throat- Mallampati III-IV , mucosa clear , drainage- none, tonsils- atrophic, +teeth Neck- flexible , trachea midline, no stridor , thyroid nl, carotid no bruit Chest -  symmetrical excursion , unlabored           Heart/CV- RRR/  Note HR 102 , no murmur , no gallop  , no rub, nl s1 s2                           - JVD- none , edema- none, stasis changes- none, varices- none           Lung- clear to P&A, wheeze- none, cough- none , dullness-none, rub- none  Chest wall-  Abd-  Br/ Gen/ Rectal- Not done, not indicated Extrem- cyanosis- none, clubbing, none, atrophy- none, strength- nl Neuro- + acting drowsy, but awake and alert during direct discussion

## 2022-12-18 ENCOUNTER — Ambulatory Visit (INDEPENDENT_AMBULATORY_CARE_PROVIDER_SITE_OTHER): Payer: Medicaid Other | Admitting: Internal Medicine

## 2022-12-18 ENCOUNTER — Encounter: Payer: Self-pay | Admitting: Internal Medicine

## 2022-12-18 ENCOUNTER — Other Ambulatory Visit: Payer: Self-pay

## 2022-12-18 VITALS — BP 118/68 | HR 63 | Ht 68.0 in | Wt 244.8 lb

## 2022-12-18 DIAGNOSIS — G47419 Narcolepsy without cataplexy: Secondary | ICD-10-CM

## 2022-12-18 DIAGNOSIS — F5101 Primary insomnia: Secondary | ICD-10-CM | POA: Diagnosis not present

## 2022-12-18 MED ORDER — AMPHETAMINE-DEXTROAMPHET ER 15 MG PO CP24
ORAL_CAPSULE | ORAL | 0 refills | Status: DC
Start: 2022-12-18 — End: 2024-01-05

## 2022-12-18 NOTE — Patient Instructions (Signed)
Instead of adderall 10 mg, try taking adderall 15 xr up to twice daily, and you can continue the modafinil

## 2022-12-18 NOTE — Assessment & Plan Note (Signed)
Continue to emphasize appropriate sleep habits including daytime naps and responsibility for driving safety. Plan-change from Adderall 10 mg tabs to Adderall 15 mg XR, up to 2 daily.  May continue modafinil if helpful.

## 2022-12-18 NOTE — Assessment & Plan Note (Signed)
Being addressed by her psychiatrist, currently with trazodone and clonazepam.

## 2022-12-19 ENCOUNTER — Other Ambulatory Visit (HOSPITAL_BASED_OUTPATIENT_CLINIC_OR_DEPARTMENT_OTHER): Payer: Self-pay

## 2023-01-02 ENCOUNTER — Other Ambulatory Visit: Payer: Self-pay | Admitting: Neurosurgery

## 2023-01-02 DIAGNOSIS — M25552 Pain in left hip: Secondary | ICD-10-CM

## 2023-01-05 ENCOUNTER — Other Ambulatory Visit (HOSPITAL_BASED_OUTPATIENT_CLINIC_OR_DEPARTMENT_OTHER): Payer: Self-pay

## 2023-01-05 MED ORDER — MOUNJARO 10 MG/0.5ML ~~LOC~~ SOAJ
10.0000 mg | SUBCUTANEOUS | 1 refills | Status: DC
  Filled 2023-01-05: qty 2, 28d supply, fill #0

## 2023-01-13 NOTE — Therapy (Deleted)
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   Pt had laparoscopic curgery for urachal cyst   Past Surgical History:  Procedure Laterality Date   CESAREAN SECTION     CESAREAN SECTION WITH BILATERAL TUBAL LIGATION Bilateral 07/21/2018   Procedure: CESAREAN SECTION WITH BILATERAL TUBAL LIGATION;  Surgeon: Kathrynn Running, MD;  Location: [facility] LD ORS;  Service: Obstetrics;  Laterality: Bilateral;   CYSTECTOMY     Patient Active Problem List   Diagnosis Date Noted   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   Excessive daytime sleepiness 09/18/2021   Microcytosis 05/21/2021   Iron deficiency anemia 08/27/2020   Flu-like symptoms 04/06/2020   Healthcare maintenance 11/26/2019   Insomnia 01/23/2019   Cervical radiculopathy 12/08/2018   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   Depression 03/23/2018   Glaucoma 03/23/2018   Family history of hypertrophic cardiomyopathy 01/21/2018   Diabetes mellitus type 2 in obese 01/21/2018    PCP: Harvest Forest, MD   REFERRING PROVIDER: Donalee Citrin, MD  REFERRING DIAG: M54.40 (ICD-10-CM) - Lumbago with sciatica, unspecified side  Rationale for Evaluation and Treatment: Rehabilitation  THERAPY DIAG:  No diagnosis found.  ONSET DATE: 8 weeks  SUBJECTIVE:                                                                                                                                                                                           SUBJECTIVE  STATEMENT: ***  PERTINENT HISTORY:    PAIN:  Are you having pain? {OPRCPAIN:27236}  PRECAUTIONS: None  RED FLAGS: None   WEIGHT BEARING RESTRICTIONS: No  FALLS:  Has patient fallen in last 6 months? No  OCCUPATION: ***  PLOF: Independent  PATIENT GOALS: to manage my back symptoms  NEXT MD VISIT: As needed  OBJECTIVE:   DIAGNOSTIC FINDINGS:  IMPRESSION: 1. At L3-4 there is a left paracentral/foraminal annular fissure. Mild left foraminal stenosis. No right foraminal stenosis. Mild bilateral facet arthropathy. 2. No acute osseous injury of the lumbar spine.     Electronically Signed   By: Elige Ko M.D.   On: 12/07/2022 11:22  PATIENT SURVEYS:  FOTO ***  MUSCLE LENGTH:  Hamstrings: Right *** deg; Left *** deg Maisie Fus test: Right *** deg; Left *** deg  POSTURE: {posture:25561}  PALPATION: ***  LUMBAR ROM:   AROM eval  Flexion   Extension   Right lateral flexion   Left lateral flexion   Right rotation   Left rotation    (Blank rows = not tested)  LOWER EXTREMITY ROM:     {AROM/PROM:27142}  Right eval Left eval  Hip flexion    Hip extension    Hip abduction    Hip adduction    Hip internal rotation    Hip external rotation    Knee flexion    Knee extension    Ankle dorsiflexion    Ankle plantarflexion    Ankle inversion    Ankle eversion     (Blank rows = not tested)  LOWER EXTREMITY MMT:    MMT Right eval Left eval  Hip flexion    Hip extension    Hip abduction    Hip adduction    Hip internal rotation    Hip external rotation    Knee flexion    Knee extension    Ankle dorsiflexion    Ankle plantarflexion    Ankle inversion    Ankle eversion     (Blank rows = not tested)  LUMBAR SPECIAL TESTS:  Straight leg raise test: {pos/neg:25243}, Slump test: {pos/neg:25243}, SI Compression/distraction test: {pos/neg:25243}, and FABER test: {pos/neg:25243}  FUNCTIONAL TESTS:  5 times sit to stand: ***  GAIT: Distance  walked: 62ft x2 Assistive device utilized: None Level of assistance: Complete Independence Comments: ***  TODAY'S TREATMENT:                                                                                                                              DATE: ***    PATIENT EDUCATION:  Education details: .eval Person educated: Patient Education method: Explanation Education comprehension: verbalized understanding and needs further education  HOME EXERCISE PROGRAM: ***  ASSESSMENT:  CLINICAL IMPRESSION: Patient is a 37 y.o. female who was seen today for physical therapy evaluation and treatment for ***.   OBJECTIVE IMPAIRMENTS: Abnormal gait, decreased activity tolerance, decreased knowledge of condition, decreased mobility, difficulty walking, decreased ROM, decreased strength, increased fascial restrictions, improper body mechanics, obesity, and pain.   ACTIVITY LIMITATIONS: carrying, lifting, bending, sitting, standing, squatting, and stairs  PERSONAL FACTORS: Age, Education, Fitness, Past/current experiences, Time since onset of injury/illness/exacerbation, and 1 comorbidity: DM  are also affecting patient's functional outcome.   REHAB POTENTIAL: Good  CLINICAL DECISION MAKING: Stable/uncomplicated  EVALUATION COMPLEXITY: Low   GOALS: Goals reviewed with patient? No  SHORT TERM GOALS: Target date: ***  *** Baseline: Goal status: INITIAL  2.  *** Baseline:  Goal status: INITIAL  3.  *** Baseline:  Goal status: INITIAL  4.  *** Baseline:  Goal status: INITIAL  5.  *** Baseline:  Goal status: INITIAL  6.  *** Baseline:  Goal status: INITIAL  LONG TERM GOALS: Target date: ***  *** Baseline:  Goal status: INITIAL  2.  *** Baseline:  Goal status: INITIAL  3.  *** Baseline:  Goal status: INITIAL  4.  *** Baseline:  Goal status: INITIAL  5.  *** Baseline:  Goal status: INITIAL  6.  *** Baseline:  Goal status: INITIAL  PLAN:  PT  FREQUENCY: 1-2x/week  PT DURATION: 6 weeks  PLANNED INTERVENTIONS: Therapeutic exercises, Therapeutic activity, Neuromuscular re-education, Balance training, Gait training, Patient/Family education, Self Care, Joint mobilization, Aquatic Therapy, Dry Needling, Electrical stimulation, Spinal mobilization, Cryotherapy, Moist heat, Manual therapy, and Re-evaluation.  PLAN FOR NEXT SESSION: HEP review and update, manual techniques as appropriate, aerobic tasks, ROM and flexibility activities, strengthening and PREs, TPDN, gait and balance training as needed     Hildred Laser, PT 01/13/2023, 2:21 PM

## 2023-01-14 ENCOUNTER — Ambulatory Visit: Payer: Medicaid Other | Attending: Neurosurgery

## 2023-01-20 NOTE — Progress Notes (Unsigned)
NEUROLOGY CONSULTATION NOTE  Christina Morton MRN: [id number] DOB: January 30, 1986  Referring provider: Jamison Oka, MD Primary care provider: Blenda Peals, MD  Reason for consult:  headaches  Assessment/Plan:   Migraine without aura, without status migrainosus, not intractable    Migraine preventative management, topiramate 50mg  at bedtime ***  Migraine rescue therapy: sumatriptan 100mg  ***  Limit use of pain relievers to no more than 2 days out of week to prevent risk of rebound or medication-overuse headache.  Keep headache diary  Exercise, diet/weight loss, hydration, caffeine cessation, sleep hygiene, monitor for and avoid triggers  Follow up ***   Subjective:  Christina Morton is a 37 year old female with type 2 diabetes mellitus and depression who presents to re-establish care for migraines.  MRI of brain, orbits and MRV head personally reviewed.   HISTORY: Since childhood, she reports flashing dark floaters in her vision.  It has occurred off and on everyday.  She denies pulsatile tinnitus.  She reports history of migraines starting in her mid-20s.  The typically are severe throbbing bi-temporal headaches associated with photophobia and phonophobia and osmophobia.  No associated nausea or vomiting.  It typically lasts 3 or 4 hours.  They typically occur 3 to 4 times a week.  No specific triggers.  They are relieved by sleep in a dark and quiet room.  Tylenol sometimes helps.  She reports that her mother and sister have migraines.  Her sister also has idiopathic intracranial hypertension.   She was evaluated by Dr. 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  Visual field testing may have showed peripheral vision loss in her right eye.  Findings on eye exam did not appear consistent with papilledema.  MRI of orbits with and without contrast on 10/19/2018 was personally reviewed and demonstrated partially empty sella with slight prominent  fluid in the optic nerve sheaths bilaterally but otherwise unremarkable with no obvious intracranial mass.  MRI and MRV of head with contrast from 03/24/2019 were unremarkable.  She underwent LP on 03/30/2019, which demonstrated borderline elevated opening pressure of 21 cm H2O.  Labs for CSF analysis, including cell count, protein, glucose, and culture, were normal.    I had started her on topiramate which was effective.  Lost to follow up after 2021.   She also had an MRI of cervical spine without contrast was performed on 01/09/2019 for left sided neck and shoulder pain, which showed mild bilateral neural foraminal narrowing at C3-4 and C4-5 but no evidence of nerve root impingement, stenosis or cord abnormality.   Past NSAIDs/analgesics:  Tylenol PM, ibuprofen Past triptans:  rizatriptan  Past antidepressant:  Cymbalta  Current analgesic:  ibuprofen Current triptan:  sumatriptan 100mg  Current antidepressant:  fluoxetine 10mg  daily Current antiepileptic:  topiramate 50mg  at bedtime   Caffeine:  *** Diet:  *** Sleep hygiene:  *** Other pain:  low back pain, sciatica.  MRI of lumbar spine on 8/18 personally reviewed revealed left paracentral/foraminal annular fissure and mild left foraminal stenosis at L3-4 with mild bilateral facet arthropathy.  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   Pt  had laparoscopic curgery for urachal cyst    PAST SURGICAL HISTORY: Past Surgical History:  Procedure Laterality Date   CESAREAN SECTION     CESAREAN SECTION WITH BILATERAL TUBAL LIGATION Bilateral 07/21/2018   Procedure: CESAREAN SECTION WITH BILATERAL TUBAL  LIGATION;  Surgeon: Kathrynn Running, MD;  Location: [facility] LD ORS;  Service: Obstetrics;  Laterality: Bilateral;   CYSTECTOMY      MEDICATIONS: Current Outpatient Medications on File Prior to Visit  Medication Sig Dispense Refill   amphetamine-dextroamphetamine (ADDERALL XR) 15 MG 24 hr capsule 1 or 2 daily as needed 60 capsule 0   Cholecalciferol (VITAMIN D3 ADULT GUMMIES) 25 MCG (1000 UT) CHEW 1 tablet     famotidine (PEPCID) 20 MG tablet Take 1 tablet (20 mg total) by mouth at bedtime. 30 tablet 1   FLUoxetine (PROZAC) 10 MG capsule 1 capsule     levonorgestrel (MIRENA) 20 MCG/DAY IUD by Intrauterine route.     lidocaine (LIDODERM) 5 % Place 1 patch onto the skin daily. Remove & Discard patch within 12 hours or as directed by MD 30 patch 0   methocarbamol (ROBAXIN) 500 MG tablet Take 1 tablet (500 mg total) by mouth every 8 (eight) hours as needed for muscle spasms. 20 tablet 0   modafinil (PROVIGIL) 200 MG tablet 1 each morning for alertness 30 tablet 0   naproxen (NAPROSYN) 500 MG tablet Take 1 tablet (500 mg total) by mouth 2 (two) times daily. 30 tablet 0   phenazopyridine (PYRIDIUM) 100 MG tablet Take 1 tablet (100 mg total) by mouth 3 (three) times daily as needed for pain. 10 tablet 0   rizatriptan (MAXALT) 10 MG tablet Take 1 tablet earliest onset of migraine.  May repeat in 2 hours if needed.  Maximum 2 tablets in 24 hours 10 tablet 3   rosuvastatin (CRESTOR) 10 MG tablet Take 1 tablet by mouth daily.     tirzepatide (MOUNJARO) 10 MG/0.5ML Pen Inject 10 mg into the skin once a week. 2 mL 1   topiramate (TOPAMAX) 50 MG tablet Take 25mg  at hs for one week and then 50mg  at thereforth at hs     traZODone (DESYREL) 50 MG tablet Take 50 mg by mouth at bedtime.     [DISCONTINUED] zolpidem (AMBIEN) 5 MG tablet Take 1 tablet (5 mg total) by mouth at bedtime. 30 tablet 0   No current facility-administered medications on file prior to visit.    ALLERGIES: Allergies  Allergen Reactions    Amoxicillin Anaphylaxis    Did it involve swelling of the face/tongue/throat, SOB, or low BP? Yes Did it involve sudden or severe rash/hives, skin peeling, or any reaction on the inside of your mouth or nose? No Did you need to seek medical attention at a hospital or doctor's office? Yes When did it last happen?      childhood allergy If all above answers are "NO", may proceed with cephalosporin use.   Penicillins Anaphylaxis    Did it involve swelling of the face/tongue/throat, SOB, or low BP? Yes Did it involve sudden or severe rash/hives, skin peeling, or any reaction on the inside of your mouth or nose? No Did you need to seek medical attention at a hospital or doctor's office? Yes When did it last happen?      childhood allergy If all above answers are "NO", may proceed with cephalosporin use.    Clindamycin/Lincomycin Diarrhea    FAMILY HISTORY: Family History  Problem Relation Age of Onset  Hypertrophic cardiomyopathy Mother    Bradycardia Mother    Healthy Father    Breast cancer Maternal Great-grandmother     Objective:  *** General: No acute distress.  Patient appears well-groomed.   Head:  Normocephalic/atraumatic Eyes:  fundi examined but not visualized Neck: supple, no paraspinal tenderness, full range of motion Back: No paraspinal tenderness Heart: regular rate and rhythm Lungs: Clear to auscultation bilaterally. Vascular: No carotid bruits. Neurological Exam: Mental status: alert and oriented to person, place, and time, speech fluent and not dysarthric, language intact. Cranial nerves: CN I: not tested CN II: pupils equal, round and reactive to light, visual fields intact CN III, IV, VI:  full range of motion, no nystagmus, no ptosis CN V: facial sensation intact. CN VII: upper and lower face symmetric CN VIII: hearing intact CN IX, X: gag intact, uvula midline CN XI: sternocleidomastoid and trapezius muscles intact CN XII: tongue midline Bulk & Tone:  normal, no fasciculations. Motor:  muscle strength 5/5 throughout Sensation:  Pinprick, temperature and vibratory sensation intact. Deep Tendon Reflexes:  2+ throughout,  toes downgoing.   Finger to nose testing:  Without dysmetria.   Heel to shin:  Without dysmetria.   Gait:  Normal station and stride.  Romberg negative.    Thank you for allowing me to take part in the care of this patient.  Shon Millet, DO  CC: ***

## 2023-01-21 ENCOUNTER — Ambulatory Visit (INDEPENDENT_AMBULATORY_CARE_PROVIDER_SITE_OTHER): Payer: Medicaid Other | Admitting: Neurology

## 2023-01-21 ENCOUNTER — Encounter: Payer: Self-pay | Admitting: Neurology

## 2023-01-21 VITALS — BP 108/76 | HR 91 | Ht 68.0 in | Wt 247.0 lb

## 2023-01-21 DIAGNOSIS — G43009 Migraine without aura, not intractable, without status migrainosus: Secondary | ICD-10-CM | POA: Diagnosis not present

## 2023-01-21 MED ORDER — AIMOVIG 140 MG/ML ~~LOC~~ SOAJ: 140.0000 mg | SUBCUTANEOUS | 11 refills | Status: AC

## 2023-01-21 NOTE — Progress Notes (Signed)
Medication Samples have been provided to the patient.  Drug name: Bernita Raisin       Strength: 100 mg        Qty: 4  LOT: [id number]  Exp.Date: 03/2024  Dosing instructions: as needed  The patient has been instructed regarding the correct time, dose, and frequency of taking this medication, including desired effects and most common side effects.   Leida Lauth 3:29 PM 01/21/2023

## 2023-01-21 NOTE — Patient Instructions (Signed)
  Start Aimovig 140mg  injection every 28 days.   Take Ubrelvy 100mg  at earliest onset of headache.  May repeat dose once in 2 hours if needed.  Maximum 2 tablets in 24 hours.  Let me know if it works Limit use of pain relievers to no more than 2 days out of the week.  These medications include acetaminophen, NSAIDs (ibuprofen/Advil/Motrin, naproxen/Aleve, triptans (Imitrex/sumatriptan), Excedrin, and narcotics.  This will help reduce risk of rebound headaches. Be aware of common food triggers Routine exercise Stay adequately hydrated (aim for 64 oz water daily) Keep headache diary Maintain proper stress management Maintain proper sleep hygiene Do not skip meals Consider supplements:  magnesium citrate 400mg  daily, riboflavin 400mg  daily, coenzyme Q10 300mg  daily.

## 2023-02-03 ENCOUNTER — Encounter (HOSPITAL_COMMUNITY): Payer: Self-pay

## 2023-02-03 ENCOUNTER — Other Ambulatory Visit (HOSPITAL_BASED_OUTPATIENT_CLINIC_OR_DEPARTMENT_OTHER): Payer: Self-pay

## 2023-02-03 ENCOUNTER — Ambulatory Visit (HOSPITAL_COMMUNITY)
Admission: RE | Admit: 2023-02-03 | Discharge: 2023-02-03 | Disposition: A | Discharge status: Home / Self Care | Payer: Medicaid Other | Source: Ambulatory Visit

## 2023-02-03 VITALS — BP 126/87 | HR 97 | Temp 98.7°F | Resp 18

## 2023-02-03 DIAGNOSIS — J069 Acute upper respiratory infection, unspecified: Secondary | ICD-10-CM | POA: Diagnosis not present

## 2023-02-03 MED ORDER — BENZONATATE 100 MG PO CAPS: 100.0000 mg | ORAL_CAPSULE | Freq: Three times a day (TID) | ORAL | 0 refills | Status: DC | PRN

## 2023-02-03 MED ORDER — CETIRIZINE HCL 10 MG PO TABS: 10.0000 mg | ORAL_TABLET | Freq: Every day | ORAL | 2 refills | Status: DC

## 2023-02-03 MED ORDER — MOUNJARO 10 MG/0.5ML ~~LOC~~ SOAJ
10.0000 mg | SUBCUTANEOUS | 1 refills | Status: AC
  Filled 2023-02-03: qty 2, 28d supply, fill #0
  Filled 2023-03-06 – 2023-03-25 (×2): qty 2, 28d supply, fill #1

## 2023-02-03 NOTE — ED Triage Notes (Signed)
Pt states she woke up this morning like 4am with a cough and congestion. She states her chest hurts only when she coughs hard. She didn't take any meds.

## 2023-02-03 NOTE — ED Provider Notes (Signed)
[facility]    CSN: [id number] Arrival date & time: 02/03/23  1107      History   Chief Complaint Chief Complaint  Patient presents with   Cough    Sore throat - Entered by patient   Nasal Congestion    HPI Christina Morton is a 37 y.o. female.  2-3 day history of nasal congestion and cough Cough is dry and causes chest discomfort At first had sore throat but resolved with tylenol. 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   Pt had laparoscopic curgery for urachal cyst    Patient Active Problem List   Diagnosis Date Noted   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   Excessive daytime sleepiness 09/18/2021   Microcytosis 05/21/2021   Iron deficiency anemia 08/27/2020   Flu-like symptoms 04/06/2020   Healthcare maintenance 11/26/2019   Insomnia 01/23/2019   Cervical radiculopathy 12/08/2018   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   Depression 03/23/2018   Glaucoma 03/23/2018   Family history of hypertrophic cardiomyopathy 01/21/2018   Type 2 diabetes mellitus with obesity (HCC) 01/21/2018    Past Surgical History:  Procedure Laterality Date   CESAREAN SECTION     CESAREAN SECTION WITH BILATERAL TUBAL LIGATION Bilateral 07/21/2018   Procedure: CESAREAN SECTION WITH BILATERAL TUBAL LIGATION;  Surgeon: Kathrynn Running, MD;  Location: [facility];  Service: Obstetrics;  Laterality: Bilateral;   CYSTECTOMY      OB History     Gravida  2   Para  2   Term  2   Preterm  0   AB  0   Living  2      SAB  0   IAB  0    Ectopic  0   Multiple  0   Live Births  2            Home Medications    Prior to Admission medications   Medication Sig Start Date End Date Taking? Authorizing Provider  amphetamine-dextroamphetamine (ADDERALL XR) 15 MG 24 hr capsule 1 or 2 daily as needed 12/18/22  Yes Young, Clinton D, MD  benzonatate (TESSALON) 100 MG capsule Take 1 capsule (100 mg total) by mouth 3 (three) times daily as needed for cough. 02/03/23  Yes Malaina Mortellaro, Lurena Joiner, PA-C  buPROPion [facility] SR) 150 MG 12 hr tablet Take 150 mg by mouth daily. 01/30/23  Yes [provider]  cetirizine (ZYRTEC ALLERGY) 10 MG tablet Take 1 tablet (10 mg total) by mouth daily. 02/03/23  Yes Lynix Bonine, Lurena Joiner, PA-C  Cholecalciferol (VITAMIN D3 ADULT GUMMIES) 25 MCG (1000 UT) CHEW 1 tablet   Yes [provider]  clonazePAM (KLONOPIN) 0.5 MG tablet Take 0.5 mg by mouth daily.   Yes [provider]  Erenumab-aooe (AIMOVIG) 140 MG/ML SOAJ Inject 140 mg into the skin every 28 (twenty-eight) days. 01/21/23  Yes Jaffe, Adam R, DO  famotidine (PEPCID) 20 MG tablet Take 1 tablet (20 mg total) by mouth at bedtime. 12/08/18  Yes Talbert Forest, [place], DO  FLUoxetine [facility])  10 MG capsule 1 capsule   Yes [provider]  levonorgestrel (MIRENA) 20 MCG/DAY IUD by Intrauterine route.   Yes [provider]  lidocaine (LIDODERM) 5 % Place 1 patch onto the skin daily. Remove & Discard patch within 12 hours or as directed by MD 11/26/22  Yes Henderly, Britni A, PA-C  LINZESS 145 MCG CAPS capsule Take 145 mcg by mouth as needed. 09/04/22  Yes [provider]  modafinil (PROVIGIL) 200 MG tablet 1 each morning for alertness 02/26/22  Yes Young, Joni Fears D, MD  naproxen (NAPROSYN) 500 MG tablet Take 1 tablet (500 mg total) by mouth 2 (two) times daily. 11/26/22  Yes Henderly, Britni A, PA-C  rosuvastatin (CRESTOR) 10 MG tablet Take 1 tablet by mouth daily. 11/21/20  Yes [provider]  tirzepatide  Greggory Keen) 10 MG/0.5ML Pen Inject 10 mg into the skin once a week. 02/03/23  Yes   topiramate (TOPAMAX) 50 MG tablet Take 25mg  at hs for one week and then 50mg  at thereforth at hs 08/11/19  Yes Talbert Forest, [place], DO  traZODone (DESYREL) 50 MG tablet Take 50 mg by mouth at bedtime.   Yes [provider]  Vitamin D, Ergocalciferol, (DRISDOL) 1.25 MG (50000 UNIT) CAPS capsule Take 50,000 Units by mouth once a week. 12/18/22  Yes [provider]  zolpidem (AMBIEN) 5 MG tablet Take 1 tablet (5 mg total) by mouth at bedtime. 05/04/19 07/09/19  Nestor Ramp, MD    Family History Family History  Problem Relation Age of Onset   Hypertrophic cardiomyopathy Mother    Bradycardia Mother    Healthy Father    Breast cancer Maternal Great-grandmother     Social History Social History   Tobacco Use   Smoking status: Former    Current packs/day: 0.00    Average packs/day: 1 pack/day for 1 year (1.0 ttl pk-yrs)    Types: Cigarettes    Start date: 11/22/2016    Quit date: 11/22/2017    Years since quitting: 5.2   Smokeless tobacco: Never  Vaping Use   Vaping status: Never Used  Substance Use Topics   Alcohol use: Not Currently    Comment: once a monthly   Drug use: Yes    Comment: CBD     Allergies   Amoxicillin, Penicillins, Clavulanic acid, and Clindamycin/lincomycin   Review of Systems Review of Systems  Respiratory:  Positive for cough.    Per HPI  Physical Exam Triage Vital Signs ED Triage Vitals  Encounter Vitals Group     BP 02/03/23 1130 126/87     Systolic BP Percentile --      Diastolic BP Percentile --      Pulse Rate 02/03/23 1130 97     Resp 02/03/23 1130 18     Temp 02/03/23 1130 98.7 F (37.1 C)     Temp Source 02/03/23 1130 Oral     SpO2 02/03/23 1130 98 %     Weight --      Height --      Head Circumference --      Peak Flow --      Pain Score 02/03/23 1128 0     Pain Loc --      Pain Education --      Exclude from Growth Chart --    No data  found.  Updated Vital Signs BP 126/87 (BP Location: Right Arm)   Pulse 97   Temp 98.7 F (37.1 C) (Oral)   Resp 18  SpO2 98%   Visual Acuity Right Eye Distance:   Left Eye Distance:   Bilateral Distance:    Right Eye Near:   Left Eye Near:    Bilateral Near:     Physical Exam Vitals and nursing note reviewed.  Constitutional:      Appearance: She is not ill-appearing.  HENT:     Nose: Congestion present. No rhinorrhea.     Mouth/Throat:     Mouth: Mucous membranes are moist.     Pharynx: Oropharynx is clear. No posterior oropharyngeal erythema.  Eyes:     Conjunctiva/sclera: Conjunctivae normal.  Cardiovascular:     Rate and Rhythm: Normal rate and regular rhythm.     Pulses: Normal pulses.     Heart sounds: Normal heart sounds.  Pulmonary:     Effort: Pulmonary effort is normal.     Breath sounds: Normal breath sounds.     Comments: Dry cough Musculoskeletal:     Cervical back: Normal range of motion.  Lymphadenopathy:     Cervical: No cervical adenopathy.  Skin:    General: Skin is warm and dry.  Neurological:     Mental Status: She is alert and oriented to person, place, and time.     UC Treatments / Results  Labs (all labs ordered are listed, but only abnormal results are displayed) Labs Reviewed - No data to display  EKG   Radiology No results found.  Procedures Procedures (including critical care time)  Medications Ordered in UC Medications - No data to display  Initial Impression / Assessment and Plan / UC Course  I have reviewed the triage vital signs and the nursing notes.  Pertinent labs & imaging results that were available during my care of the patient were reviewed by me and considered in my medical decision making (see chart for details).  Afebrile, clear lungs. Discussed viral etiology Tessalon TID prn with drowsy precautions. Other OTC meds and symptomatic care. Patient agrees to plan, no questions at this time  Final Clinical  Impressions(s) / UC Diagnoses   Final diagnoses:  Viral URI with cough     Discharge Instructions      The tessalon cough pills can be taken 3x daily. If this medication makes you drowsy, take only one pill before bed.  Zyrtec once daily  I recommend honey for cough as well. Drink lots of fluids! Prop yourself up at night time. It may take a week or so for symptoms to improve and resolve.  Please return if needed     ED Prescriptions     Medication Sig Dispense Auth. Provider   benzonatate (TESSALON) 100 MG capsule Take 1 capsule (100 mg total) by mouth 3 (three) times daily as needed for cough. 30 capsule Julie Nay, PA-C   cetirizine (ZYRTEC ALLERGY) 10 MG tablet Take 1 tablet (10 mg total) by mouth daily. 30 tablet Truth Barot, Lurena Joiner, PA-C      PDMP not reviewed this encounter.   Baily Serpe, Ray Church 02/03/23 1218

## 2023-02-03 NOTE — Discharge Instructions (Addendum)
The tessalon cough pills can be taken 3x daily. If this medication makes you drowsy, take only one pill before bed.  Zyrtec once daily  I recommend honey for cough as well. Drink lots of fluids! Prop yourself up at night time. It may take a week or so for symptoms to improve and resolve.  Please return if needed

## 2023-02-04 ENCOUNTER — Other Ambulatory Visit: Payer: Medicaid Other

## 2023-02-14 NOTE — Progress Notes (Deleted)
12/10/21- 83 yoF former smoker with concern of EDS Medical problem list includes DM2, Obesity, Cervical Radiculopathy, Depression, Glaucoma, FE Anemia,  09/18/2021- Initial Eval by Clent Ridges, NP: Patient presents today for new sleep consult.  Patient reports symptoms of insomnia, fallas asleep while driving, restless sleep and exhausted. She had NPSG on 05/17/21 that showed no evidence of sleep apnea, average AHI 0.6/hr with SpO2 low 92%. She falls asleep very easily during the day. She is tired all the time. Her typicl bedtime varies, she try's to go to sleep by 11pm. Sometimes it will take her 1-4 hours to fall asleep. She wakes up on average once a night. She starts her day at 7:30 to help her son get ready for virtual home schooling. She will lay back down around 8am and it might take her until 9:30-10am to fall asleep and she will get an additional 1-2 hours of sleep. She has tried several sleep aids in the past including 5mg  Ambien, 2-4mg  Lunesta, melatonin and trazodone in the past without improvement.  She is currently being weaned off of Lunesta and is on 1 mg at bedtime.  She has used Benadryl in the past with some results however her eye doctor told her not to take this regularly due to her history of glaucoma.  Concern for possible narcolepsy.  No symptoms of cataplexy or sleepwalking. NPSG 05/17/21- AHI 0.6/hr, desaturation to 92%, body weight 250 lbs REM latency 41 minutes, REM 26.9% of TST NPSG 11/12/21- AHI 2.5/ hr, desaturation to 86%, body weight 250 lbs  REM latency 67.5 minutes, REM 27.4% of TST MSLT 11/13/21- Mean Sleep Latency 5.86 minutes, Slept on 5/5 naps, 1 SOREM (pt unsure about recent use of Prozac or Lunesta prior to this NPSG) Usual meds include Lunesta 1 mg, Prozac 10 mg,  Epworth score- Body weight today-256 lbs This married mother describes significant daytime sleepiness since early childhood.  Pattern is stable.  Needs naps but does not find them refreshing.  We discussed safety  responsibility while driving.  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  No similar family history.  Husband tells her she snores.  Not currently taking anything to help her difficulty with sleep onset, saying medications including most recently Lunesta did not help.  Once asleep she sleeps soundly.  1 cup of coffee once or twice a week.  Does not find it very helpful.  Denies pregnancy.  Thyroid function checked. We discussed her sleep studies which show no evidence of significant OSA.  Pathologic sleepiness demonstrated on MSLT not meeting the specific evidence of increased REM pressure usually required for a firm diagnosis of narcolepsy.  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  -----------------------------------------   12/18/22- 37 yoF former smoker followed for Narcolepsy, complicated by Obesity, Cervical Radiculopathy, Depression, Glaucoma, FE Anemia -Ambien 5,  Adderall 10 x 3,  modafinil 200, Body weight today-244 lbs She reports that her psychiatrist, Dr. Tomasa Hose, has been trying to help her with her insomnia complaint and now he is using trazodone and clonazepam.  She was not sure of doses.  She still describes significant trouble sleeping at night even though she fights oppressive sleepiness during the day.  The 10 mg  Adderall tabs did not make enough difference, but she had said last time that the Adderall XR 20 mg with modafinil, was too much.  She makes quite a point of seeming very sleepy here at our office and  I think we need to keep in mind the possibility of a conversion problem best managed by her psychiatrist.  She is seeking disability.  02/17/23- 37 yoF former smoker followed for Narcolepsy/ Insomnia, complicated by Obesity, Cervical Radiculopathy, Depression, Glaucoma, FE Anemia, DM2,  -Ambien 5,  Adderall 10 x 3,  modafinil 200, Body weight today-   ROS-see HPI   + =  positive Constitutional:    weight loss, night sweats, fevers, chills, +fatigue, lassitude. HEENT:    headaches, difficulty swallowing, tooth/dental problems, sore throat,       sneezing, itching, ear ache, nasal congestion, post nasal drip, snoring CV:    chest pain, orthopnea, PND, swelling in lower extremities, anasarca,                                   dizziness, palpitations Resp:   shortness of breath with exertion or at rest.                productive cough,   non-productive cough, coughing up of blood.              change in color of mucus.  wheezing.   Skin:    rash or lesions. GI:  No-   heartburn, indigestion, abdominal pain, nausea, vomiting, diarrhea,                 change in bowel habits, loss of appetite GU: dysuria, change in color of urine, no urgency or frequency.   flank pain. MS:   joint pain, stiffness, decreased range of motion, back pain. Neuro-     nothing unusual Psych:  change in mood or affect.  depression or anxiety.   memory loss.  OBJ- Physical Exam General- Wakes to voice, yawning, Oriented, Affect-calm, Distress- none acute, +overweight,  Skin- rash-none, lesions- none, excoriation- none Lymphadenopathy- none Head- atraumatic            Eyes- Gross vision intact, PERRLA, conjunctivae and secretions clear            Ears- Hearing, canals-normal            Nose- Clear, no-Septal dev, mucus, polyps, erosion, perforation             Throat- Mallampati III-IV , mucosa clear , drainage- none, tonsils- atrophic, +teeth Neck- flexible , trachea midline, no stridor , thyroid nl, carotid no bruit Chest - symmetrical excursion , unlabored           Heart/CV- RRR/  Note HR 102 , no murmur , no gallop  , no rub, nl s1 s2                           - JVD- none , edema- none, stasis changes- none, varices- none           Lung- clear to P&A, wheeze- none, cough- none , dullness-none, rub- none           Chest wall-  Abd-  Br/ Gen/ Rectal- Not done, not  indicated Extrem- cyanosis- none, clubbing, none, atrophy- none, strength- nl Neuro- + acting drowsy, but awake and alert during direct discussion

## 2023-02-17 ENCOUNTER — Ambulatory Visit: Payer: Medicaid Other | Admitting: Internal Medicine

## 2023-02-19 ENCOUNTER — Encounter: Payer: Self-pay | Admitting: Internal Medicine

## 2023-02-23 ENCOUNTER — Ambulatory Visit
Admission: RE | Admit: 2023-02-23 | Discharge: 2023-02-23 | Disposition: A | Discharge status: Home / Self Care | Payer: Medicaid Other | Source: Ambulatory Visit | Attending: Neurosurgery | Admitting: Neurosurgery

## 2023-02-23 DIAGNOSIS — M25552 Pain in left hip: Secondary | ICD-10-CM

## 2023-03-16 ENCOUNTER — Other Ambulatory Visit (HOSPITAL_COMMUNITY): Payer: Self-pay

## 2023-03-18 ENCOUNTER — Other Ambulatory Visit (HOSPITAL_COMMUNITY): Payer: Self-pay

## 2023-03-25 ENCOUNTER — Other Ambulatory Visit (HOSPITAL_BASED_OUTPATIENT_CLINIC_OR_DEPARTMENT_OTHER): Payer: Self-pay

## 2023-04-08 ENCOUNTER — Encounter: Payer: Self-pay | Admitting: Neurology

## 2023-04-28 ENCOUNTER — Ambulatory Visit: Payer: Medicaid Other | Attending: Physical Therapy | Admitting: Physical Therapy

## 2023-07-24 ENCOUNTER — Ambulatory Visit: Payer: Medicaid Other | Admitting: Neurology

## 2023-07-28 ENCOUNTER — Ambulatory Visit: Admitting: Obstetrics and Gynecology

## 2023-08-11 NOTE — Progress Notes (Deleted)
 NEUROLOGY FOLLOW UP OFFICE NOTE  Christina Morton [id number]  Assessment/Plan:   Migraine without aura, without status migrainosus, not intractable    Migraine preventative management, Start Aimovig  140mg  every 28 days  Migraine rescue therapy: she will try samples of Ubrelvy 100mg   Limit use of pain relievers to no more than 2 days out of week to prevent risk of rebound or medication-overuse headache.  Keep headache diary  Exercise, diet/weight loss, hydration, caffeine cessation, sleep hygiene, monitor for and avoid triggers  Follow up 6 months.   Subjective:  Christina Morton is a 38 year old female with type 2 diabetes mellitus, PTSD + and depression who follows up for migraines.  UPDATE: Aimovig  Florette Hurry *** Intensity:  *** Duration:  *** Frequency:  *** Frequency of abortive medication: *** Current analgesic:  naproxen  500mg  Current triptan:  none Current muscle relaxant:  Robaxin  500mg  Current antidepressant:  fluoxetine 10mg  daily Current antiepileptic:  topiramate  50mg  at bedtime Current birth control:  Mirena  Other medications:  Trazodone 50mg  at bedtime, Adderall XR 15mg  1-2 times daily PRN, modafinil  200mg , Mounjaro    Caffeine:  Sweet tea. Occasionally soda.  No coffee Diet:  Water.  1 soda a day.  Skips meals.  No more than 2 meals a day.  Mounjaro  suppresses her appetite. Sleep hygiene:  insomnia.  Recently diagnosed with narcolepsy.   Other pain:  low back pain, sciatica.  MRI of lumbar spine on 8/18 personally reviewed revealed left paracentral/foraminal annular fissure and mild left foraminal stenosis at L3-4 with mild bilateral facet arthropathy.    HISTORY: Since childhood, she reports flashing dark floaters in her vision.  It has occurred off and on everyday.  She denies pulsatile tinnitus.  She reports history of migraines starting in her mid-20s.  The typically are severe throbbing bi-temporal/frontal headaches associated with photophobia and phonophobia  and osmophobia.  No associated nausea or vomiting.  It typically lasts 3 or 4 hours.  They typically occur 3 to 4 times a week.  No specific triggers.  They are relieved by sleep in a dark and quiet room.  Tylenol  sometimes helps.  She reports that her mother and sister have migraines.  Her sister also has idiopathic intracranial hypertension.   She was evaluated by Dr. 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  Visual field testing may have showed peripheral vision loss in her right eye.  Findings on eye exam did not appear consistent with papilledema.  MRI of orbits with and without contrast on 10/19/2018 was personally reviewed and demonstrated partially empty sella with slight prominent fluid in the optic nerve sheaths bilaterally but otherwise unremarkable with no obvious intracranial mass.  MRI and MRV of head with contrast from 03/24/2019 were unremarkable.  She underwent LP on 03/30/2019, which demonstrated borderline elevated opening pressure of 21 cm H2O.  Labs for CSF analysis, including cell count, protein, glucose, and culture, were normal.    I had started her on topiramate  which was effective.  Never tried rizatriptan .  Lost to follow up after 2021.  Topiramate  was effective until 2023.  Migraines started increasing in frequency.  She stopped taking the topiramate .  Migraines are severe.  They typically last several hours.  They occur 2 to 3 times a week.  She currently has had a migraine for the past 3 days.     She also had an MRI of cervical spine without contrast was performed on 01/09/2019 for left sided neck and shoulder pain, which  showed mild bilateral neural foraminal narrowing at C3-4 and C4-5 but no evidence of nerve root impingement, stenosis or cord abnormality.   Past NSAIDs/analgesics:  Tylenol  PM, ibuprofen  Past triptans:  sumatriptan, rizatriptan  Past antidepressant:  Cymbalta Past antiepileptic:  topiramate  50mg  at  bedtime    She is applying for disability because she says she cannot work.  First, she has narcolepsy.  She has PTSD.  She has sciatica.    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 30 capsule 0   buPROPion (WELLBUTRIN SR) 150 MG 12 hr tablet Take 150 mg by mouth daily.     cetirizine  (ZYRTEC  ALLERGY) 10 MG tablet Take 1 tablet (10 mg total) by mouth daily. 30 tablet 2   Cholecalciferol (VITAMIN D3 ADULT GUMMIES) 25 MCG (1000 UT) CHEW 1 tablet     clonazePAM (KLONOPIN) 0.5 MG tablet Take 0.5 mg by mouth daily.     Erenumab -aooe (AIMOVIG ) 140 MG/ML SOAJ Inject 140 mg into the skin every 28 (twenty-eight) days. 1.12 mL 11   famotidine  (PEPCID ) 20 MG tablet Take 1 tablet (20 mg total) by mouth at bedtime. 30 tablet 1   FLUoxetine (PROZAC) 10 MG capsule 1 capsule     levonorgestrel  (MIRENA ) 20 MCG/DAY IUD by Intrauterine route.     lidocaine  (LIDODERM ) 5 % Place 1 patch onto the skin daily. Remove & Discard patch within 12 hours or as directed by MD 30 patch 0   LINZESS 145 MCG CAPS capsule Take 145 mcg by mouth as needed.     modafinil  (PROVIGIL ) 200 MG tablet 1 each morning for alertness 30 tablet 0   naproxen  (NAPROSYN ) 500 MG tablet Take 1 tablet (500 mg total) by mouth 2 (two) times  daily. 30 tablet 0   rosuvastatin (CRESTOR) 10 MG tablet Take 1 tablet by mouth daily.     tirzepatide  (MOUNJARO ) 10 MG/0.5ML Pen Inject 10 mg into the skin once a week. 2 mL 1   topiramate  (TOPAMAX ) 50 MG tablet Take 25mg  at hs for one week and then 50mg  at thereforth at hs     traZODone (DESYREL) 50 MG tablet Take 50 mg by mouth at bedtime.     Vitamin D, Ergocalciferol, (DRISDOL) 1.25 MG (50000 UNIT) CAPS capsule Take 50,000 Units by mouth once a week.     [DISCONTINUED] zolpidem  (AMBIEN ) 5 MG tablet Take 1 tablet (5 mg total) by mouth at bedtime. 30 tablet 0   No current facility-administered medications on file prior to visit.    ALLERGIES: Allergies  Allergen Reactions   Amoxicillin Anaphylaxis    Did it involve swelling of the face/tongue/throat, SOB, or low BP? Yes Did it involve sudden or severe rash/hives, skin peeling, or any reaction on the  inside of your mouth or nose? No Did you need to seek medical attention at a hospital or doctor's office? Yes When did it last happen?      childhood allergy If all above answers are "NO", may proceed with cephalosporin use.   Penicillins Anaphylaxis    Did it involve swelling of the face/tongue/throat, SOB, or low BP? Yes Did it involve sudden or severe rash/hives, skin peeling, or any reaction on the inside of your mouth or nose? No Did you need to seek medical attention at a hospital or doctor's office? Yes When did it last happen?      childhood allergy If all above answers are "NO", may proceed with cephalosporin use.    Clavulanic Acid    Clindamycin /Lincomycin Diarrhea    FAMILY HISTORY: Family History  Problem Relation Age of Onset   Hypertrophic cardiomyopathy Mother    Bradycardia Mother    Healthy Father    Breast cancer Maternal Great-grandmother       Objective:  *** General: No acute distress.  Patient appears well-groomed.   ***   Janne Members, DO  CC: Alberteen Aloe, MD

## 2023-08-12 ENCOUNTER — Ambulatory Visit: Payer: Medicaid Other | Admitting: Neurology

## 2023-09-07 ENCOUNTER — Other Ambulatory Visit (HOSPITAL_COMMUNITY)
Admission: RE | Admit: 2023-09-07 | Discharge: 2023-09-07 | Disposition: A | Discharge status: Home / Self Care | Source: Ambulatory Visit | Attending: Obstetrics and Gynecology | Admitting: Obstetrics and Gynecology

## 2023-09-07 ENCOUNTER — Ambulatory Visit: Admitting: Obstetrics and Gynecology

## 2023-09-07 VITALS — BP 105/76 | HR 80 | Wt 236.0 lb

## 2023-09-07 DIAGNOSIS — Z30433 Encounter for removal and reinsertion of intrauterine contraceptive device: Secondary | ICD-10-CM | POA: Diagnosis not present

## 2023-09-07 DIAGNOSIS — Z124 Encounter for screening for malignant neoplasm of cervix: Secondary | ICD-10-CM | POA: Insufficient documentation

## 2023-09-07 DIAGNOSIS — N870 Mild cervical dysplasia: Secondary | ICD-10-CM

## 2023-09-07 DIAGNOSIS — Z01419 Encounter for gynecological examination (general) (routine) without abnormal findings: Secondary | ICD-10-CM

## 2023-09-07 DIAGNOSIS — Z975 Presence of (intrauterine) contraceptive device: Secondary | ICD-10-CM

## 2023-09-07 MED ORDER — LEVONORGESTREL 20 MCG/DAY IU IUD
1.0000 | INTRAUTERINE_SYSTEM | Freq: Once | INTRAUTERINE | Status: AC
Start: 2023-09-07 — End: 2023-09-07
  Administered 2023-09-07: 1 via INTRAUTERINE

## 2023-09-07 NOTE — Progress Notes (Signed)
 ANNUAL EXAM Patient name: Christina Morton MRN [id number]  Date of birth: Sep 26, 1985 Chief Complaint:   Gynecologic Exam  History of Present Illness:   Vivien Barretto is a 38 y.o. G2P2002 being seen today for a routine annual exam.  Current complaints: IUD exchange  Menstrual concerns? No  amenorrheic with IUD in placed - placed for bleeding control  Breast or nipple changes? No  Contraception use? Yes s/p TL Sexually active? Yes female partner, no pain with intercourse  Willing to proceed with IUD exchange today.  Noted that she has had IUD twice before without issues, and has not had issues with removals or reinsertion's in the past.  No LMP recorded. (Menstrual status: IUD).   The pregnancy intention screening data noted above was reviewed. Potential methods of contraception were discussed. The patient elected to proceed with No data recorded.   Last pap     Component Value Date/Time   DIAGPAP (A) 02/28/2021 1356    - Atypical squamous cells of undetermined significance (ASC-US )   HPVHIGH Positive (A) 02/28/2021 1356   ADEQPAP  02/28/2021 1356    Satisfactory for evaluation; transformation zone component PRESENT.   Colpo (04/18/2021) CIN 1, benign ECC  Last mammogram: n/a.  Last colonoscopy: n/a.      04/18/2021    3:14 PM 04/05/2020    3:47 PM 11/25/2019    3:31 PM 08/09/2019    1:42 PM 05/07/2019   11:11 AM  Depression screen PHQ 2/9  Decreased Interest 3 0 3 0 3  Down, Depressed, Hopeless 3 1 3  0 3  PHQ - 2 Score 6 1 6  0 6  Altered sleeping 3  3  3   Tired, decreased energy 3  3  3   Change in appetite 3  3  3   Feeling bad or failure about yourself  2  3  3   Trouble concentrating 2  1  0  Moving slowly or fidgety/restless 0  0  0  Suicidal thoughts 0  0  0  PHQ-9 Score 19  19  18   Difficult doing work/chores     Extremely dIfficult        04/18/2021    3:14 PM 08/05/2018   10:04 AM 08/05/2018    9:04 AM 06/21/2018   11:13 AM  GAD 7 : Generalized Anxiety Score   Nervous, Anxious, on Edge 3 3 3 3   Control/stop worrying 3 2 2 3   Worry too much - different things 3 3 3 3   Trouble relaxing 3 2 2 2   Restless 1 0 0 0  Easily annoyed or irritable 2 0 0 1  Afraid - awful might happen 2 2 2 3   Total GAD 7 Score 17 12 12 15      Review of Systems:   Pertinent items are noted in HPI Denies any headaches, blurred vision, fatigue, shortness of breath, chest pain, abdominal pain, abnormal vaginal discharge/itching/odor/irritation, problems with periods, bowel movements, urination, or intercourse unless otherwise stated above. Pertinent History Reviewed:  Reviewed past medical,surgical, social and family history.  Reviewed problem list, medications and allergies. Physical Assessment:   Vitals:   09/07/23 1321  BP: 105/76  Pulse: 80  Weight: 236 lb (107 kg)  Body mass index is 35.88 kg/m.        Physical Examination:   General appearance - well appearing, and in no distress  Mental status - alert, oriented to person, place, and time  Psych:  She has a normal mood and affect  Skin -  warm and dry, normal color, no suspicious lesions noted  Chest - effort normal, all lung fields clear to auscultation bilaterally  Heart - normal rate and regular rhythm  Breasts - breasts appear normal, no suspicious masses, no skin or nipple changes or  axillary nodes  Abdomen - soft, nontender, nondistended, no masses or organomegaly  Pelvic -  VULVA: normal appearing vulva with no masses, tenderness or lesions   VAGINA: normal appearing vagina with normal color and discharge, no lesions   CERVIX: normal appearing cervix without discharge or lesions, no CMT  Thin prep pap is done with HR HPV cotesting  Extremities:  No swelling or varicosities noted  Chaperone present for exam  IUD Removal  Patient identified, informed consent performed, consent signed.  Patient was in the dorsal lithotomy position, normal external genitalia was noted.  A speculum was placed in  the patient's vagina, normal discharge was noted, no lesions. The cervix was visualized, no lesions, no abnormal discharge.  The strings of the IUD were visualized, grasped and pulled using ring forceps. The IUD was removed in its entirety. . Patient tolerated the procedure well.     IUD Insertion Procedure Note Patient identified, informed consent performed, consent signed.   Discussed risks of irregular bleeding, cramping, infection, malpositioning or misplacement of the IUD outside the uterus which may require further procedure such as laparoscopy. Also discussed >99% contraception efficacy, increased risk of ectopic pregnancy with failure of method.  Time out was performed.  Urine pregnancy test negative.  Speculum placed in the vagina.  Cervix visualized.  Cleaned with Betadine x 2.  Anterior cervix grasped with a single tooth tenaculum.  Paracervical block was not administered.  MIrena  IUD placed per manufacturer's recommendations.  Strings trimmed to 3 cm. Tenaculum was removed, good hemostasis noted.  Patient tolerated procedure well.   Patient was given post-procedure instructions.  She was advised to have backup contraception for one week.  Patient was also asked to check IUD strings periodically and follow up prn for IUD check.   Assessment & Plan:   1. Well woman exam with routine gynecological exam (Primary) - Cervical cancer screening: Discussed guidelines. Pap with HPV collected - see separte problems - GC/CT: declines - Birth Control: s/p TL - Breast Health: Encouraged self breast awareness/SBE. Teaching provided.  - F/U 12 months and prn - Cytology - PAP  2. Dysplasia of cervix, low grade (CIN 1) 3. Screening for cervical cancer Now status post Pap collection today.  Noted to have CIN-1 on prior colposcopy. - Cytology - PAP  4. Encounter for removal and reinsertion of intrauterine contraceptive device (IUD) Now s/p uncomplicated removal and insertion.  Reviewed possible  bleeding pattern changes.  Approved for 5 years for control of abnormal uterine bleeding. - levonorgestrel  (MIRENA ) 20 MCG/DAY IUD 1 each   No orders of the defined types were placed in this encounter.   Meds:  Meds ordered this encounter  Medications   levonorgestrel  (MIRENA ) 20 MCG/DAY IUD 1 each    Follow-up: No follow-ups on file.  Kiki Pelton, MD 09/07/2023 1:29 PM

## 2023-09-09 ENCOUNTER — Ambulatory Visit: Payer: Self-pay | Admitting: Obstetrics and Gynecology

## 2023-09-09 LAB — CYTOLOGY - PAP
Comment: NEGATIVE
Diagnosis: NEGATIVE
High risk HPV: NEGATIVE

## 2023-11-06 ENCOUNTER — Ambulatory Visit
Admission: EM | Admit: 2023-11-06 | Discharge: 2023-11-06 | Disposition: A | Discharge status: Home / Self Care | Attending: Family Medicine | Admitting: Family Medicine

## 2023-11-06 DIAGNOSIS — N3001 Acute cystitis with hematuria: Secondary | ICD-10-CM | POA: Insufficient documentation

## 2023-11-06 DIAGNOSIS — R300 Dysuria: Secondary | ICD-10-CM | POA: Insufficient documentation

## 2023-11-06 LAB — POCT URINALYSIS DIP (MANUAL ENTRY)
Glucose, UA: 100 mg/dL — AB
Ketones, POC UA: NEGATIVE mg/dL
Nitrite, UA: NEGATIVE
Protein Ur, POC: 100 mg/dL — AB
Spec Grav, UA: >=1.03 — AB (ref 1.010–1.025)
Urobilinogen, UA: 1 U/dL — AB
pH, UA: 5.5 (ref 5.0–8.0)

## 2023-11-06 MED ORDER — NITROFURANTOIN MONOHYD MACRO 100 MG PO CAPS: 100.0000 mg | ORAL_CAPSULE | Freq: Two times a day (BID) | ORAL | 0 refills | Status: DC

## 2023-11-06 NOTE — ED Triage Notes (Signed)
 Pt states urinary frequency since yesterday.

## 2023-11-06 NOTE — Discharge Instructions (Addendum)
Please start Macrobid to address an urinary tract infection. Make sure you hydrate very well with plain water and a quantity of 80 ounces of water a day.  Please limit drinks that are considered urinary irritants such as soda, sweet tea, coffee, energy drinks, alcohol.  These can worsen your urinary and genital symptoms but also be the source of them.  I will let you know about your urine culture results through MyChart to see if we need to prescribe or change your antibiotics based off of those results.  

## 2023-11-06 NOTE — ED Provider Notes (Signed)
 [facility]  Note:  This document was prepared using Conservation officer, historic buildings and may include unintentional dictation errors.  MRN: [id number] DOB: Dec 01, 1985  Subjective:   Christina Morton is a 38 y.o. female presenting for 1 day history of urinary frequency, dysuria. Denies fever, n/v, abdominal pain, pelvic pain, rashes, hematuria, vaginal discharge. Has a history of UTIs. Last episode was ~2 months ago. Does not hydrate well. Drinks urinary irritants as well. No concern for STI per patient.   No current facility-administered medications for this encounter.  Current Outpatient Medications:    amphetamine -dextroamphetamine  (ADDERALL XR) 15 MG 24 hr capsule, 1 or 2 daily as needed, Disp: 60 capsule, Rfl: 0   buPROPion (WELLBUTRIN SR) 150 MG 12 hr tablet, Take 150 mg by mouth daily., Disp: , Rfl:    cetirizine  (ZYRTEC  ALLERGY) 10 MG tablet, Take 1 tablet (10 mg total) by mouth daily. (Patient not taking: Reported on 09/07/2023), Disp: 30 tablet, Rfl: 2   Cholecalciferol (VITAMIN D3 ADULT GUMMIES) 25 MCG (1000 UT) CHEW, 1 tablet, Disp: , Rfl:    clonazePAM (KLONOPIN) 0.5 MG tablet, Take 0.5 mg by mouth daily., Disp: , Rfl:    Erenumab -aooe (AIMOVIG ) 140 MG/ML SOAJ, Inject 140 mg into the skin every 28 (twenty-eight) days. (Patient not taking: Reported on 09/07/2023), Disp: 1.12 mL, Rfl: 11   famotidine  (PEPCID ) 20 MG tablet, Take 1 tablet (20 mg total) by mouth at bedtime., Disp: 30 tablet, Rfl: 1   FLUoxetine (PROZAC) 10 MG capsule, 1 capsule, Disp: , Rfl:    levonorgestrel  (MIRENA ) 20 MCG/DAY IUD, by Intrauterine route., Disp: , Rfl:    lidocaine  (LIDODERM ) 5 %, Place 1 patch onto the skin daily. Remove & Discard patch within 12 hours or as directed by MD (Patient not taking: Reported on 09/07/2023), Disp: 30 patch, Rfl: 0   LINZESS 145 MCG CAPS capsule, Take 145 mcg by mouth as needed., Disp: , Rfl:    modafinil  (PROVIGIL ) 200 MG tablet, 1 each morning for  alertness, Disp: 30 tablet, Rfl: 0   naproxen  (NAPROSYN ) 500 MG tablet, Take 1 tablet (500 mg total) by mouth 2 (two) times daily., Disp: 30 tablet, Rfl: 0   rosuvastatin (CRESTOR) 10 MG tablet, Take 1 tablet by mouth daily., Disp: , Rfl:    tirzepatide  (MOUNJARO ) 10 MG/0.5ML Pen, Inject 10 mg into the skin once a week., Disp: 2 mL, Rfl: 1   topiramate  (TOPAMAX ) 50 MG tablet, Take 25mg  at hs for one week and then 50mg  at thereforth at hs, Disp: , Rfl:    traZODone (DESYREL) 50 MG tablet, Take 50 mg by mouth at bedtime., Disp: , Rfl:    Vitamin D, Ergocalciferol, (DRISDOL) 1.25 MG (50000 UNIT) CAPS capsule, Take 50,000 Units by mouth once a week., Disp: , Rfl:    Allergies  Allergen Reactions   Amoxicillin Anaphylaxis    Did it involve swelling of the face/tongue/throat, SOB, or low BP? Yes Did it involve sudden or severe rash/hives, skin peeling, or any reaction on the inside of your mouth or nose? No Did you need to seek medical attention at a hospital or doctor's office? Yes When did it last happen?      childhood allergy If all above answers are NO, may proceed with cephalosporin use.   Penicillins Anaphylaxis    Did it involve swelling of the face/tongue/throat, SOB, or low BP? Yes Did it involve sudden or severe rash/hives, skin peeling, or any reaction on the inside of  your mouth or nose? No Did you need to seek medical attention at a hospital or doctor's office? Yes When did it last happen?      childhood allergy If all above answers are "NO", may proceed with cephalosporin use.    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     Past Surgical History:  Procedure Laterality Date   CESAREAN SECTION     CESAREAN SECTION WITH BILATERAL TUBAL LIGATION Bilateral 07/21/2018   Procedure: CESAREAN SECTION WITH BILATERAL TUBAL LIGATION;  Surgeon: Kandis Devaughn Sayres, MD;  Location: [facility] ORS;  Service: Obstetrics;  Laterality: Bilateral;   CYSTECTOMY      Family History  Problem Relation Age of Onset   Hypertrophic cardiomyopathy Mother    Bradycardia Mother    Healthy Father    Breast cancer Maternal Great-grandmother     Social History   Tobacco Use   Smoking status: Former    Current packs/day: 0.00    Average packs/day: 1 pack/day for 1 year (1.0 ttl pk-yrs)    Types: Cigarettes    Start date: 11/22/2016    Quit date: 11/22/2017    Years since quitting: 5.9   Smokeless tobacco: Never  Vaping Use   Vaping status: Never Used  Substance Use Topics   Alcohol use: Not Currently    Comment: once a monthly   Drug use: Yes    Comment: CBD    ROS   Objective:   Vitals: BP 116/78 (BP Location: Right Arm)   Pulse 93   Temp 98.5 F (36.9 C) (Oral)   Resp 16   SpO2 98%   Physical Exam Constitutional:      General: She is not in acute distress.    Appearance: Normal appearance. She is well-developed. She is not ill-appearing, toxic-appearing or diaphoretic.  HENT:     Head: Normocephalic and atraumatic.     Nose: Nose normal.     Mouth/Throat:     Mouth: Mucous membranes are moist.  Eyes:     General: No scleral icterus.       Right eye: No discharge.        Left eye: No discharge.     Extraocular Movements: Extraocular movements intact.     Conjunctiva/sclera: Conjunctivae normal.  Cardiovascular:     Rate and Rhythm: Normal rate.  Pulmonary:     Effort: Pulmonary effort is normal.  Abdominal:     General: Bowel sounds are normal. There is no distension.     Palpations: Abdomen is soft. There is no mass.     Tenderness: There  is no abdominal tenderness. There is no right CVA tenderness, left CVA tenderness, guarding or rebound.  Skin:    General: Skin is warm and dry.  Neurological:     General: No focal deficit present.     Mental Status: She is alert and oriented to person, place, and time.  Psychiatric:        Mood and Affect: Mood normal.  Behavior: Behavior normal.        Thought Content: Thought content normal.        Judgment: Judgment normal.     Results for orders placed or performed during the hospital encounter of 11/06/23 (from the past 24 hours)  POCT urinalysis dipstick     Status: Abnormal   Collection Time: 11/06/23 11:28 AM  Result Value Ref Range   Color, UA yellow yellow   Clarity, UA clear clear   Glucose, UA =100 (A) negative mg/dL   Bilirubin, UA small (A) negative   Ketones, POC UA negative negative mg/dL   Spec Grav, UA >=8.969 (A) 1.010 - 1.025   Blood, UA small (A) negative   pH, UA 5.5 5.0 - 8.0   Protein Ur, POC =100 (A) negative mg/dL   Urobilinogen, UA 1.0 (A) 0.2 or 1.0 E.U./dL   Nitrite, UA Negative Negative   Leukocytes, UA Small (1+) (A) Negative    Assessment and Plan :   PDMP not reviewed this encounter.  1. Acute cystitis with hematuria   2. Dysuria    Start Macrobid  to cover for acute cystitis, urine culture pending.  Recommended aggressive hydration, limiting urinary irritants. Counseled patient on potential for adverse effects with medications prescribed/recommended today, ER and return-to-clinic precautions discussed, patient verbalized understanding.    Christopher Savannah, PA-C 11/06/23 1136

## 2023-11-08 LAB — URINE CULTURE: Culture: 80000 — AB

## 2023-11-09 ENCOUNTER — Ambulatory Visit (HOSPITAL_COMMUNITY): Payer: Self-pay

## 2023-11-20 NOTE — Procedures (Signed)
 Christina Morton

## 2023-11-24 ENCOUNTER — Telehealth: Admitting: Family Medicine

## 2023-11-24 DIAGNOSIS — N390 Urinary tract infection, site not specified: Secondary | ICD-10-CM

## 2023-11-24 DIAGNOSIS — B379 Candidiasis, unspecified: Secondary | ICD-10-CM | POA: Diagnosis not present

## 2023-11-24 DIAGNOSIS — T3695XA Adverse effect of unspecified systemic antibiotic, initial encounter: Secondary | ICD-10-CM | POA: Diagnosis not present

## 2023-11-24 MED ORDER — FLUCONAZOLE 150 MG PO TABS
150.0000 mg | ORAL_TABLET | ORAL | 0 refills | Status: DC
Start: 2023-11-24 — End: 2024-01-05

## 2023-11-24 MED ORDER — CIPROFLOXACIN HCL 500 MG PO TABS
500.0000 mg | ORAL_TABLET | Freq: Two times a day (BID) | ORAL | 0 refills | Status: AC
Start: 2023-11-24 — End: 2023-11-27

## 2023-11-24 NOTE — Progress Notes (Signed)
 Virtual Visit Consent   Christina Morton, you are scheduled for a virtual visit with a [facility] provider today. Just as with appointments in the office, your consent must be obtained to participate. Your consent will be active for this visit and any virtual visit you may have with one of our providers in the next 365 days. If you have a MyChart account, a copy of this consent can be sent to you electronically.  As this is a virtual visit, video technology does not allow for your provider to perform a traditional examination. This may limit your provider's ability to fully assess your condition. If your provider identifies any concerns that need to be evaluated in person or the need to arrange testing (such as labs, EKG, etc.), we will make arrangements to do so. Although advances in technology are sophisticated, we cannot ensure that it will always work on either your end or our end. If the connection with a video visit is poor, the visit may have to be switched to a telephone visit. With either a video or telephone visit, we are not always able to ensure that we have a secure connection.  By engaging in this virtual visit, you consent to the provision of healthcare and authorize for your insurance to be billed (if applicable) for the services provided during this visit. Depending on your insurance coverage, you may receive a charge related to this service.  I need to obtain your verbal consent now. Are you willing to proceed with your visit today? Christina Morton has provided verbal consent on 11/24/2023 for a virtual visit (video or telephone). Christina CHRISTELLA Barefoot, NP  Date: 11/24/2023 10:20 AM   Virtual Visit via Video Note   I, Christina Morton, connected with  Christina Morton  ([id number], 05-Nov-1985) on 11/24/23 at 10:15 AM EDT by a video-enabled telemedicine application and verified that I am speaking with the correct person using two identifiers.  Location: Patient: Virtual Visit Location Patient:  Home Provider: Virtual Visit Location Provider: Home Office   I discussed the limitations of evaluation and management by telemedicine and the availability of in person appointments. The patient expressed understanding and agreed to proceed.    History of Present Illness: Christina Morton is a 38 y.o. who identifies as a female who was assigned female at birth, and is being seen today for recurrent UTI  Recently treated in person with Macrobid  for UTI, but followed that with a car trip and limited bathroom stops. Reports symptoms returned a few days ago, tried AZO with limited improvement.  Has high allergy to PCN.  Denies blood in urine, back and pelvic pain, fevers or chills.  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 Yes Did it involve sudden or severe rash/hives, skin peeling, or any reaction on the inside of your mouth or nose? No Did you need to seek medical attention at a hospital or doctor's  office? Yes When did it last happen?      childhood allergy If all above answers are NO, may proceed with cephalosporin use.   Penicillins Anaphylaxis    Did it involve swelling of the face/tongue/throat, SOB, or low BP? Yes Did it involve sudden or severe rash/hives, skin peeling, or any reaction on the inside of your mouth or nose? No Did you need to seek medical  attention at a hospital or doctor's office? Yes When did it last happen?      childhood allergy If all above answers are "NO", may proceed with cephalosporin use.    Clavulanic Acid    Clindamycin /Lincomycin Diarrhea   Medications:  Current Outpatient Medications:    ciprofloxacin  (CIPRO ) 500 MG tablet, Take 1 tablet (500 mg total) by mouth 2 (two) times daily for 3 days., Disp: 6 tablet, Rfl: 0   fluconazole  (DIFLUCAN ) 150 MG tablet, Take 1 tablet (150 mg total) by mouth as directed. Repeat in 3 days as needed, Disp: 2 tablet, Rfl: 0   amphetamine -dextroamphetamine  (ADDERALL XR) 15 MG 24 hr capsule, 1 or 2 daily as needed, Disp: 60 capsule, Rfl: 0   buPROPion (WELLBUTRIN SR) 150 MG 12 hr tablet, Take 150 mg by mouth daily., Disp: , Rfl:    cetirizine  (ZYRTEC  ALLERGY) 10 MG tablet, Take 1 tablet (10 mg total) by mouth daily. (Patient not taking: Reported on 09/07/2023), Disp: 30 tablet, Rfl: 2   Cholecalciferol (VITAMIN D3 ADULT GUMMIES) 25 MCG (1000 UT) CHEW, 1 tablet, Disp: , Rfl:    clonazePAM (KLONOPIN) 0.5 MG tablet, Take 0.5 mg by mouth daily., Disp: , Rfl:    Erenumab -aooe (AIMOVIG ) 140 MG/ML SOAJ, Inject 140 mg into the skin every 28 (twenty-eight) days. (Patient not taking: Reported on 09/07/2023), Disp: 1.12 mL, Rfl: 11   famotidine  (PEPCID ) 20 MG tablet, Take 1 tablet (20 mg total) by mouth at bedtime., Disp: 30 tablet, Rfl: 1   FLUoxetine (PROZAC) 10 MG capsule, 1 capsule, Disp: , Rfl:    levonorgestrel  (MIRENA ) 20 MCG/DAY IUD, by Intrauterine route., Disp: , Rfl:    lidocaine  (LIDODERM ) 5 %, Place 1 patch onto the skin daily. Remove & Discard patch within 12 hours or as directed by MD (Patient not taking: Reported on 09/07/2023), Disp: 30 patch, Rfl: 0   LINZESS 145 MCG CAPS capsule, Take 145 mcg by mouth as needed., Disp: , Rfl:    modafinil  (PROVIGIL ) 200 MG tablet, 1 each morning for alertness, Disp: 30 tablet, Rfl: 0   naproxen  (NAPROSYN ) 500 MG tablet, Take 1 tablet (500 mg  total) by mouth 2 (two) times daily., Disp: 30 tablet, Rfl: 0   nitrofurantoin , macrocrystal-monohydrate, (MACROBID ) 100 MG capsule, Take 1 capsule (100 mg total) by mouth 2 (two) times daily., Disp: 10 capsule, Rfl: 0   rosuvastatin (CRESTOR) 10 MG tablet, Take 1 tablet by mouth daily., Disp: , Rfl:    tirzepatide  (MOUNJARO ) 10 MG/0.5ML Pen, Inject 10 mg into the skin once a week., Disp: 2 mL, Rfl: 1   topiramate  (TOPAMAX ) 50 MG tablet, Take 25mg  at hs for one week and then 50mg  at thereforth at hs, Disp: , Rfl:    traZODone (DESYREL) 50 MG tablet, Take 50 mg by mouth at bedtime., Disp: , Rfl:    Vitamin D, Ergocalciferol, (DRISDOL) 1.25 MG (50000 UNIT) CAPS capsule, Take 50,000 Units by mouth once a week., Disp: , Rfl:   Observations/Objective: Patient is well-developed, well-nourished in no acute distress.  Resting comfortably  at home.  Head is normocephalic, atraumatic.  No labored breathing.  Speech is clear and coherent with logical content.  Patient is alert and oriented at baseline.    Assessment and Plan: 1. Recurrent UTI (urinary tract infection) (Primary) - ciprofloxacin  (CIPRO ) 500 MG tablet; Take 1 tablet (500 mg total) by mouth 2 (two) times daily for 3 days.  Dispense: 6 tablet; Refill: 0  2. Antibiotic-induced yeast infection - fluconazole  (DIFLUCAN ) 150 MG tablet; Take 1 tablet (150 mg total) by mouth as directed. Repeat in 3 days as needed  Dispense: 2 tablet; Refill: 0  -no other red flags for stone or kidney infection -increase fluids -complete medication as discussed -prevention discussed and on AVS  Reviewed side effects, risks and benefits of medication.    Patient acknowledged agreement and understanding of the plan.   Past Medical, Surgical, Social History, Allergies, and Medications have been Reviewed.    Follow Up Instructions: I discussed the assessment and treatment plan with the patient. The patient was provided an opportunity to ask questions and  all were answered. The patient agreed with the plan and demonstrated an understanding of the instructions.  A copy of instructions were sent to the patient via MyChart unless otherwise noted below.    The patient was advised to call back or seek an in-person evaluation if the symptoms worsen or if the condition fails to improve as anticipated.    Christina CHRISTELLA Barefoot, NP

## 2023-11-24 NOTE — Patient Instructions (Addendum)
 Lori Launer, thank you for joining [name], NP for today's virtual visit.  While this provider is not your primary care provider (PCP), if your PCP is located in our provider database this encounter information will be shared with them immediately following your visit.   A [facility] MyChart account gives you access to today's visit and all your visits, tests, and labs performed at [facility]  click here if you don't have a [facility] MyChart account or go to [email or web address]  Consent: (Patient) Christina Morton provided verbal consent for this virtual visit at the beginning of the encounter.  Current Medications:  Current Outpatient Medications:    ciprofloxacin  (CIPRO ) 500 MG tablet, Take 1 tablet (500 mg total) by mouth 2 (two) times daily for 3 days., Disp: 6 tablet, Rfl: 0   fluconazole  (DIFLUCAN ) 150 MG tablet, Take 1 tablet (150 mg total) by mouth as directed. Repeat in 3 days as needed, Disp: 2 tablet, Rfl: 0   amphetamine -dextroamphetamine  (ADDERALL XR) 15 MG 24 hr capsule, 1 or 2 daily as needed, Disp: 60 capsule, Rfl: 0   buPROPion (WELLBUTRIN SR) 150 MG 12 hr tablet, Take 150 mg by mouth daily., Disp: , Rfl:    cetirizine  (ZYRTEC  ALLERGY) 10 MG tablet, Take 1 tablet (10 mg total) by mouth daily. (Patient not taking: Reported on 09/07/2023), Disp: 30 tablet, Rfl: 2   Cholecalciferol (VITAMIN D3 ADULT GUMMIES) 25 MCG (1000 UT) CHEW, 1 tablet, Disp: , Rfl:    clonazePAM (KLONOPIN) 0.5 MG tablet, Take 0.5 mg by mouth daily., Disp: , Rfl:    Erenumab -aooe (AIMOVIG ) 140 MG/ML SOAJ, Inject 140 mg into the skin every 28 (twenty-eight) days. (Patient not taking: Reported on 09/07/2023), Disp: 1.12 mL, Rfl: 11   famotidine  (PEPCID ) 20 MG tablet, Take 1 tablet (20 mg total) by mouth at bedtime., Disp: 30 tablet, Rfl: 1   FLUoxetine (PROZAC) 10 MG capsule, 1 capsule, Disp: , Rfl:    levonorgestrel  (MIRENA ) 20 MCG/DAY IUD, by Intrauterine route., Disp: , Rfl:     lidocaine  (LIDODERM ) 5 %, Place 1 patch onto the skin daily. Remove & Discard patch within 12 hours or as directed by MD (Patient not taking: Reported on 09/07/2023), Disp: 30 patch, Rfl: 0   LINZESS 145 MCG CAPS capsule, Take 145 mcg by mouth as needed., Disp: , Rfl:    modafinil  (PROVIGIL ) 200 MG tablet, 1 each morning for alertness, Disp: 30 tablet, Rfl: 0   naproxen  (NAPROSYN ) 500 MG tablet, Take 1 tablet (500 mg total) by mouth 2 (two) times daily., Disp: 30 tablet, Rfl: 0   nitrofurantoin , macrocrystal-monohydrate, (MACROBID ) 100 MG capsule, Take 1 capsule (100 mg total) by mouth 2 (two) times daily., Disp: 10 capsule, Rfl: 0   rosuvastatin (CRESTOR) 10 MG tablet, Take 1 tablet by mouth daily., Disp: , Rfl:    tirzepatide  (MOUNJARO ) 10 MG/0.5ML Pen, Inject 10 mg into the skin once a week., Disp: 2 mL, Rfl: 1   topiramate  (TOPAMAX ) 50 MG tablet, Take 25mg  at hs for one week and then 50mg  at thereforth at hs, Disp: , Rfl:    traZODone (DESYREL) 50 MG tablet, Take 50 mg by mouth at bedtime., Disp: , Rfl:    Vitamin D, Ergocalciferol, (DRISDOL) 1.25 MG (50000 UNIT) CAPS capsule, Take 50,000 Units by mouth once a week., Disp: , Rfl:    Medications ordered in this encounter:  Meds ordered this encounter  Medications   ciprofloxacin  (CIPRO ) 500 MG tablet    Sig: Take  1 tablet (500 mg total) by mouth 2 (two) times daily for 3 days.    Dispense:  6 tablet    Refill:  0    Supervising Provider:   LAMPTEY, PHILIP O [[id number]]   fluconazole  (DIFLUCAN ) 150 MG tablet    Sig: Take 1 tablet (150 mg total) by mouth as directed. Repeat in 3 days as needed    Dispense:  2 tablet    Refill:  0    Supervising Provider:   LAMPTEY, PHILIP O [[id number]]     *If you need refills on other medications prior to your next appointment, please contact your pharmacy*  Follow-Up: Call back or seek an in-person evaluation if the symptoms worsen or if the condition fails to improve as anticipated.  [facility] [phone]  Other Instructions  Urinary Tract Infection, Female A urinary tract infection (UTI) is an infection in your urinary tract. The urinary tract is made up of organs that make, store, and get rid of pee (urine) in your body. These organs include: The kidneys. The ureters. The bladder. The urethra. What are the causes? Most UTIs are caused by germs called bacteria. They may be in or near your genitals. These germs grow and cause swelling in your urinary tract. What increases the risk? You're more likely to get a UTI if: You're a female. The urethra is shorter in females than in males. You have a soft tube called a catheter that drains your pee. You can't control when you pee or poop. You have trouble peeing because of: A kidney stone. A urinary blockage. A nerve condition that affects your bladder. Not getting enough to drink. You're sexually active. You use a birth control inside your vagina, like spermicide. You're pregnant. You have low levels of the hormone estrogen in your body. You're an older adult. You're also more likely to get a UTI if you have other health problems. These may include: Diabetes. A weak immune system. Your immune system is your body's defense system. Sickle cell disease. Injury of the spine. What are the signs or symptoms? Symptoms may include: Needing to pee right away. Peeing small amounts often. Pain or burning when you pee. Blood in your pee. Pee that smells bad or odd. Pain in your belly or lower back. You may also: Feel confused. This may be the first symptom in older adults. Vomit. Not feel hungry. Feel tired or easily annoyed. Have a fever or chills. How is this diagnosed? A UTI is diagnosed based on your medical history and an exam. You may also have other tests. These may include: Pee tests. Blood tests. Tests for sexually transmitted infections (STIs). If you've had more than one UTI, you may need to have imaging  studies done to find out why you keep getting them. How is this treated? A UTI can be treated by: Taking antibiotics or other medicines. Drinking enough fluid to keep your pee pale yellow. In rare cases, a UTI can cause a very bad condition called sepsis. Sepsis may be treated in the hospital. Follow these instructions at home: Medicines Take your medicines only as told by your health care provider. If you were given antibiotics, take them as told by your provider. Do not stop taking them even if you start to feel better. General instructions Make sure you: Pee often and fully. Do not hold your pee for a long time. Wipe from front to back after you pee or poop. Use each tissue only  once when you wipe. Pee after you have sex. Do not douche or use sprays or powders in your genital area. Contact a health care provider if: Your symptoms don't get better after 1-2 days of taking antibiotics. Your symptoms go away and then come back. You have a fever or chills. You vomit or feel like you may vomit. Get help right away if: You have very bad pain in your back or lower belly. You faint. This information is not intended to replace advice given to you by your health care provider. Make sure you discuss any questions you have with your health care provider. Document Revised: 03/18/2023 Document Reviewed: 07/11/2022 Elsevier Patient Education  [employer].   If you have been instructed to have an in-person evaluation today at a local Urgent Care facility, please use the link below. It will take you to a list of all of our available [facility], including address, phone number and hours of operation. Please do not delay care.  [facility]  If you or a family member do not have a primary care provider, use the link below to schedule a visit and establish care. When you choose a [facility] primary care physician or advanced practice provider, you gain a long-term  partner in health. Find a Primary Care Provider  Learn more about [facility]'s in-office and virtual care options: [facility] - Get Care Now

## 2024-01-01 ENCOUNTER — Ambulatory Visit: Admitting: Physical Therapy

## 2024-01-04 ENCOUNTER — Telehealth: Payer: Self-pay | Admitting: Internal Medicine

## 2024-01-04 NOTE — Telephone Encounter (Signed)
 PT is dropping off paper work for DR.Young. Paperwork has been placed in his mailbox.

## 2024-01-04 NOTE — Progress Notes (Signed)
 12/10/21- 87 yoF former smoker with concern of EDS Medical problem list includes DM2, Obesity, Cervical Radiculopathy, Depression, Glaucoma, FE Anemia,  09/18/2021- Initial Eval by Hope, NP: Patient presents today for new sleep consult.  Patient reports symptoms of insomnia, fallas asleep while driving, restless sleep and exhausted. She had NPSG on 05/17/21 that showed no evidence of sleep apnea, average AHI 0.6/hr with SpO2 low 92%. She falls asleep very easily during the day. She is tired all the time. Her typicl bedtime varies, she try's to go to sleep by 11pm. Sometimes it will take her 1-4 hours to fall asleep. She wakes up on average once a night. She starts her day at 7:30 to help her son get ready for virtual home schooling. She will lay back down around 8am and it might take her until 9:30-10am to fall asleep and she will get an additional 1-2 hours of sleep. She has tried several sleep aids in the past including 5mg  Ambien , 2-4mg  Lunesta , melatonin and trazodone in the past without improvement.  She is currently being weaned off of Lunesta  and is on 1 mg at bedtime.  She has used Benadryl  in the past with some results however her eye doctor told her not to take this regularly due to her history of glaucoma.  Concern for possible narcolepsy.  No symptoms of cataplexy or sleepwalking. NPSG 05/17/21- AHI 0.6/hr, desaturation to 92%, body weight 250 lbs REM latency 41 minutes, REM 26.9% of TST NPSG 11/12/21- AHI 2.5/ hr, desaturation to 86%, body weight 250 lbs  REM latency 67.5 minutes, REM 27.4% of TST MSLT 11/13/21- Mean Sleep Latency 5.86 minutes, Slept on 5/5 naps, 1 SOREM (pt unsure about recent use of Prozac or Lunesta  prior to this NPSG) Usual meds include Lunesta  1 mg, Prozac 10 mg,  Epworth score- Body weight today-256 lbs This married mother describes significant daytime sleepiness since early childhood.  Pattern is stable.  Needs naps but does not find them refreshing.  We discussed safety  responsibility while driving.  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  No similar family history.  Husband tells her she snores.  Not currently taking anything to help her difficulty with sleep onset, saying medications including most recently Lunesta  did not help.  Once asleep she sleeps soundly.  1 cup of coffee once or twice a week.  Does not find it very helpful.  Denies pregnancy.  Thyroid function checked. We discussed her sleep studies which show no evidence of significant OSA.  Pathologic sleepiness demonstrated on MSLT not meeting the specific evidence of increased REM pressure usually required for a firm diagnosis of narcolepsy.  I told her I would use the term Probable Narcolepsy, rather than Idiopathic Hypersomnia.  -----------------------------------------   12/18/22- 37 yoF former smoker followed for Narcolepsy, complicated by Obesity, Cervical Radiculopathy, Depression, Glaucoma, FE Anemia -Ambien  5,  Adderall 10 x 3,  modafinil  200, Body weight today-244 lbs She reports that her psychiatrist, Dr. Delynn, has been trying to help her with her insomnia complaint and now he is using trazodone and clonazepam.  She was not sure of doses.  She still describes significant trouble sleeping at night even though she fights oppressive sleepiness during the day.  The 10 mg  Adderall tabs did not make enough difference, but she had said last time that the Adderall XR 20 mg with modafinil , was too much.  She makes quite a point of seeming very sleepy here at our office and  I think we need to keep in mind the possibility of a conversion problem best managed by her psychiatrist.  She is seeking disability.  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  History of Present Illness   Rubi Tooley is a 38 year old female with narcolepsy who presents for medication management and sleep issues. She brings a form for completion related to her disability status. she indicates other health problems including eyesight, not managed here, are also significantly limiting. 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  She experiences ongoing sleep disturbances, feeling exhausted upon waking despite sleeping four to five hours per night. She tries to avoid napping but sometimes finds it necessary due to excessive tiredness. Sleep hygiene and adequate sleep emphasized.  Her narcolepsy is managed with medications. She currently takes 5 mg of Adderall twice a day, once in the morning and once in the afternoon, avoiding late-day doses. She uses modafinil  as needed when Adderall is insufficient. For sleep, she alternates between trazodone and clonazepam, depending on effectiveness, and does not take both simultaneously.     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 Adjusted Adderall dosage due to excessive wakefulness and sleep disturbances. Uses trazodone or clonazepam for sleep as needed. - Prescribed Adderall 5 mg twice daily. - Continued modafinil  as needed. - Refilled prescriptions at [facility] in [facility].     ROS-see HPI   + = positive Constitutional:    weight loss, night sweats, fevers, chills, +fatigue, lassitude. HEENT:    headaches, difficulty swallowing, tooth/dental problems, sore throat,       sneezing, itching, ear ache, nasal congestion, post nasal drip, snoring CV:    chest pain, orthopnea, PND, swelling in lower extremities, anasarca,                                   dizziness, palpitations Resp:   shortness of breath with exertion or at  rest.                productive cough,   non-productive cough, coughing up of blood.              change in color of mucus.  wheezing.   Skin:    rash or lesions. GI:  No-   heartburn, indigestion, abdominal pain, nausea, vomiting, diarrhea,                 change in bowel habits, loss of appetite GU: dysuria, change in color of urine, no urgency or frequency.   flank pain. MS:   joint pain, stiffness, decreased range of motion, back pain. Neuro-     nothing unusual Psych:  change in mood or affect.  depression or anxiety.   memory loss.  OBJ- Physical Exam General- Wakes to voice, yawning, Oriented, Affect-calm, Distress- none acute, +overweight,  Skin- rash-none, lesions- none, excoriation- none Lymphadenopathy- none Head- atraumatic            Eyes- Gross vision intact, PERRLA, conjunctivae and secretions clear            Ears- Hearing, canals-normal  Nose- Clear, no-Septal dev, mucus, polyps, erosion, perforation             Throat- Mallampati III-IV , mucosa clear , drainage- none, tonsils- atrophic, +teeth Neck- flexible , trachea midline, no stridor , thyroid nl, carotid no bruit Chest - symmetrical excursion , unlabored           Heart/CV- RRR/  Note HR 102 , no murmur , no gallop  , no rub, nl s1 s2                           - JVD- none , edema- none, stasis changes- none, varices- none           Lung- clear to P&A, wheeze- none, cough- none , dullness-none, rub- none           Chest wall-  Abd-  Br/ Gen/ Rectal- Not done, not indicated Extrem- cyanosis- none, clubbing, none, atrophy- none, strength- nl Neuro- + acting drowsy, but awake and alert during direct discussion

## 2024-01-05 ENCOUNTER — Encounter: Payer: Self-pay | Admitting: Internal Medicine

## 2024-01-05 ENCOUNTER — Ambulatory Visit (INDEPENDENT_AMBULATORY_CARE_PROVIDER_SITE_OTHER): Admitting: Internal Medicine

## 2024-01-05 VITALS — BP 110/68 | HR 81 | Temp 97.6°F | Ht 67.0 in | Wt 221.8 lb

## 2024-01-05 DIAGNOSIS — G4719 Other hypersomnia: Secondary | ICD-10-CM | POA: Diagnosis not present

## 2024-01-05 DIAGNOSIS — G47419 Narcolepsy without cataplexy: Secondary | ICD-10-CM

## 2024-01-05 MED ORDER — AMPHETAMINE-DEXTROAMPHETAMINE 5 MG PO TABS: ORAL_TABLET | ORAL | 0 refills | Status: AC

## 2024-01-05 MED ORDER — MODAFINIL 200 MG PO TABS: ORAL_TABLET | ORAL | 5 refills | Status: AC

## 2024-01-05 NOTE — Patient Instructions (Signed)
 Modafinil  refilled  Adderall refilled as 5 mg tabs- 1 twice daily as needed

## 2024-01-06 ENCOUNTER — Ambulatory Visit: Attending: Internal Medicine | Admitting: Physical Therapy

## 2024-01-09 ENCOUNTER — Encounter: Payer: Self-pay | Admitting: Internal Medicine

## 2024-04-08 ENCOUNTER — Ambulatory Visit: Admitting: Obstetrics and Gynecology

## 2024-06-03 ENCOUNTER — Ambulatory Visit: Admitting: Obstetrics and Gynecology
